# Patient Record
Sex: Female | Born: 1985 | Race: Black or African American | Hispanic: No | Smoking: Former smoker
Health system: Southern US, Community
[De-identification: ages and names within clinical notes are randomized; demographics above are authoritative.]

## PROBLEM LIST (undated history)

## (undated) ENCOUNTER — Inpatient Hospital Stay (HOSPITAL_COMMUNITY): Payer: Medicaid Other

## (undated) DIAGNOSIS — D649 Anemia, unspecified: Secondary | ICD-10-CM

## (undated) DIAGNOSIS — Z789 Other specified health status: Secondary | ICD-10-CM

## (undated) DIAGNOSIS — E669 Obesity, unspecified: Secondary | ICD-10-CM

## (undated) DIAGNOSIS — A749 Chlamydial infection, unspecified: Secondary | ICD-10-CM

## (undated) DIAGNOSIS — D573 Sickle-cell trait: Secondary | ICD-10-CM

## (undated) DIAGNOSIS — K8020 Calculus of gallbladder without cholecystitis without obstruction: Secondary | ICD-10-CM

## (undated) DIAGNOSIS — R87619 Unspecified abnormal cytological findings in specimens from cervix uteri: Secondary | ICD-10-CM

## (undated) DIAGNOSIS — B999 Unspecified infectious disease: Secondary | ICD-10-CM

## (undated) DIAGNOSIS — L309 Dermatitis, unspecified: Secondary | ICD-10-CM

## (undated) DIAGNOSIS — IMO0002 Reserved for concepts with insufficient information to code with codable children: Secondary | ICD-10-CM

---
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
## 2007-04-23 ENCOUNTER — Emergency Department (HOSPITAL_COMMUNITY): Admission: EM | Admit: 2007-04-23 | Discharge: 2007-04-23 | Payer: Self-pay | Admitting: Family Medicine

## 2008-02-16 ENCOUNTER — Inpatient Hospital Stay (HOSPITAL_COMMUNITY): Admission: AD | Admit: 2008-02-16 | Discharge: 2008-02-17 | Payer: Self-pay | Admitting: Obstetrics & Gynecology

## 2008-02-18 ENCOUNTER — Inpatient Hospital Stay (HOSPITAL_COMMUNITY): Admission: AD | Admit: 2008-02-18 | Discharge: 2008-02-18 | Payer: Self-pay | Admitting: Obstetrics & Gynecology

## 2008-02-18 ENCOUNTER — Ambulatory Visit: Payer: Self-pay | Admitting: Family

## 2008-03-06 HISTORY — PX: APPENDECTOMY: SHX54

## 2008-03-31 ENCOUNTER — Inpatient Hospital Stay (HOSPITAL_COMMUNITY): Admission: AD | Admit: 2008-03-31 | Discharge: 2008-04-01 | Payer: Self-pay | Admitting: Obstetrics & Gynecology

## 2008-04-06 ENCOUNTER — Ambulatory Visit (HOSPITAL_COMMUNITY): Admission: RE | Admit: 2008-04-06 | Discharge: 2008-04-06 | Payer: Self-pay | Admitting: Obstetrics & Gynecology

## 2008-08-15 ENCOUNTER — Ambulatory Visit: Payer: Self-pay | Admitting: Advanced Practice Midwife

## 2008-08-15 ENCOUNTER — Inpatient Hospital Stay (HOSPITAL_COMMUNITY): Admission: AD | Admit: 2008-08-15 | Discharge: 2008-08-15 | Payer: Self-pay | Admitting: Family Medicine

## 2008-08-29 ENCOUNTER — Ambulatory Visit: Payer: Self-pay | Admitting: Family

## 2008-08-29 ENCOUNTER — Inpatient Hospital Stay (HOSPITAL_COMMUNITY): Admission: AD | Admit: 2008-08-29 | Discharge: 2008-08-29 | Payer: Self-pay | Admitting: Obstetrics and Gynecology

## 2008-09-02 ENCOUNTER — Ambulatory Visit: Payer: Self-pay | Admitting: Obstetrics & Gynecology

## 2008-09-06 ENCOUNTER — Inpatient Hospital Stay (HOSPITAL_COMMUNITY): Admission: AD | Admit: 2008-09-06 | Discharge: 2008-09-06 | Payer: Self-pay | Admitting: Obstetrics & Gynecology

## 2008-09-07 ENCOUNTER — Inpatient Hospital Stay (HOSPITAL_COMMUNITY): Admission: AD | Admit: 2008-09-07 | Discharge: 2008-09-09 | Payer: Self-pay | Admitting: Obstetrics & Gynecology

## 2008-09-07 ENCOUNTER — Ambulatory Visit: Payer: Self-pay | Admitting: Advanced Practice Midwife

## 2008-11-27 ENCOUNTER — Emergency Department (HOSPITAL_COMMUNITY): Admission: EM | Admit: 2008-11-27 | Discharge: 2008-11-27 | Payer: Self-pay | Admitting: Family Medicine

## 2008-11-28 ENCOUNTER — Inpatient Hospital Stay (HOSPITAL_COMMUNITY): Admission: EM | Admit: 2008-11-28 | Discharge: 2008-11-30 | Payer: Self-pay | Admitting: Emergency Medicine

## 2009-03-06 HISTORY — PX: CHOLECYSTECTOMY: SHX55

## 2009-08-31 ENCOUNTER — Emergency Department (HOSPITAL_COMMUNITY): Admission: EM | Admit: 2009-08-31 | Discharge: 2009-08-31 | Payer: Self-pay | Admitting: Family Medicine

## 2009-11-17 ENCOUNTER — Emergency Department: Payer: Self-pay | Admitting: Emergency Medicine

## 2010-01-22 ENCOUNTER — Emergency Department (HOSPITAL_COMMUNITY): Admission: EM | Admit: 2010-01-22 | Discharge: 2010-01-22 | Payer: Self-pay | Admitting: Family Medicine

## 2010-02-15 ENCOUNTER — Emergency Department: Payer: Self-pay | Admitting: Emergency Medicine

## 2010-02-17 ENCOUNTER — Inpatient Hospital Stay: Payer: Self-pay | Admitting: Surgery

## 2010-03-06 NOTE — L&D Delivery Note (Cosign Needed)
Delivery Note At 6:56 PM a viable female was delivered via Vaginal, Spontaneous Delivery (Presentation: Middle Occiput Posterior).  APGAR: 9 ,9 ; weight 7 lb 8.3 oz (3410 g).   Placenta status: to hospital.  Cord: 3 vessels with the following complications: None.  Cord pH: NA  Anesthesia: Epidural  Episiotomy: NA Lacerations: None Suture Repair: NA Est. Blood Loss (mL): 200 cc  Mom to postpartum.  Baby to nursery-stable.  Birth control: desires BTL, consent complete Feeding: Breast  DE LA CRUZ,Tabias Swayze 02/11/2011, 7:20 PM

## 2010-05-22 LAB — POCT URINALYSIS DIP (DEVICE)
Bilirubin Urine: NEGATIVE
Glucose, UA: NEGATIVE mg/dL
Ketones, ur: NEGATIVE mg/dL
Specific Gravity, Urine: 1.02 (ref 1.005–1.030)
pH: 6 (ref 5.0–8.0)

## 2010-06-10 LAB — CBC
HCT: 29.1 % — ABNORMAL LOW (ref 36.0–46.0)
Hemoglobin: 12 g/dL (ref 12.0–15.0)
MCHC: 33.8 g/dL (ref 30.0–36.0)
MCV: 87.8 fL (ref 78.0–100.0)
Platelets: 185 10*3/uL (ref 150–400)
RBC: 3.3 MIL/uL — ABNORMAL LOW (ref 3.87–5.11)
RBC: 4.05 MIL/uL (ref 3.87–5.11)
WBC: 11.4 10*3/uL — ABNORMAL HIGH (ref 4.0–10.5)
WBC: 6.6 10*3/uL (ref 4.0–10.5)

## 2010-06-10 LAB — URINALYSIS, ROUTINE W REFLEX MICROSCOPIC
Bilirubin Urine: NEGATIVE
Glucose, UA: NEGATIVE mg/dL
Ketones, ur: 15 mg/dL — AB
Protein, ur: 30 mg/dL — AB
Urobilinogen, UA: 1 mg/dL (ref 0.0–1.0)

## 2010-06-10 LAB — CULTURE, BLOOD (ROUTINE X 2)

## 2010-06-10 LAB — URINE MICROSCOPIC-ADD ON

## 2010-06-10 LAB — POCT I-STAT, CHEM 8
BUN: 8 mg/dL (ref 6–23)
Calcium, Ion: 1.09 mmol/L — ABNORMAL LOW (ref 1.12–1.32)
Creatinine, Ser: 0.8 mg/dL (ref 0.4–1.2)
Hemoglobin: 12.2 g/dL (ref 12.0–15.0)
Sodium: 138 mEq/L (ref 135–145)
TCO2: 22 mmol/L (ref 0–100)

## 2010-06-10 LAB — POCT URINALYSIS DIP (DEVICE)
Glucose, UA: NEGATIVE mg/dL
Specific Gravity, Urine: <=1.005 (ref 1.005–1.030)
pH: 5.5 (ref 5.0–8.0)

## 2010-06-10 LAB — URINE CULTURE: Colony Count: 10000

## 2010-06-10 LAB — PREGNANCY, URINE: Preg Test, Ur: NEGATIVE

## 2010-06-10 LAB — DIFFERENTIAL
Basophils Relative: 0 % (ref 0–1)
Eosinophils Absolute: 0 10*3/uL (ref 0.0–0.7)
Lymphs Abs: 1.3 10*3/uL (ref 0.7–4.0)
Monocytes Absolute: 1.1 10*3/uL — ABNORMAL HIGH (ref 0.1–1.0)
Monocytes Relative: 10 % (ref 3–12)

## 2010-06-12 LAB — WET PREP, GENITAL
Clue Cells Wet Prep HPF POC: NONE SEEN
Trich, Wet Prep: NONE SEEN
Yeast Wet Prep HPF POC: NONE SEEN

## 2010-06-12 LAB — GC/CHLAMYDIA PROBE AMP, GENITAL
Chlamydia, DNA Probe: NEGATIVE
GC Probe Amp, Genital: NEGATIVE

## 2010-06-12 LAB — CBC
MCHC: 34.7 g/dL (ref 30.0–36.0)
MCV: 88.3 fL (ref 78.0–100.0)
Platelets: 181 10*3/uL (ref 150–400)
Platelets: 195 10*3/uL (ref 150–400)
RDW: 14.9 % (ref 11.5–15.5)
RDW: 15 % (ref 11.5–15.5)
WBC: 11.4 10*3/uL — ABNORMAL HIGH (ref 4.0–10.5)

## 2010-06-12 LAB — RPR: RPR Ser Ql: NONREACTIVE

## 2010-06-20 LAB — WET PREP, GENITAL
Trich, Wet Prep: NONE SEEN
Yeast Wet Prep HPF POC: NONE SEEN

## 2010-06-20 LAB — URINALYSIS, ROUTINE W REFLEX MICROSCOPIC
Bilirubin Urine: NEGATIVE
Nitrite: NEGATIVE
Specific Gravity, Urine: 1.01 (ref 1.005–1.030)
Urobilinogen, UA: 0.2 mg/dL (ref 0.0–1.0)

## 2010-07-11 LAB — HEPATITIS B SURFACE ANTIGEN: Hepatitis B Surface Ag: NEGATIVE

## 2010-07-13 ENCOUNTER — Ambulatory Visit: Payer: Self-pay | Admitting: Family Medicine

## 2010-08-26 ENCOUNTER — Inpatient Hospital Stay (HOSPITAL_COMMUNITY)
Admission: AD | Admit: 2010-08-26 | Discharge: 2010-08-26 | Disposition: A | Discharge status: Home / Self Care | Payer: Medicaid Other | Source: Ambulatory Visit | Attending: Obstetrics & Gynecology | Admitting: Obstetrics & Gynecology

## 2010-08-26 DIAGNOSIS — O209 Hemorrhage in early pregnancy, unspecified: Secondary | ICD-10-CM | POA: Insufficient documentation

## 2010-08-26 DIAGNOSIS — Z79899 Other long term (current) drug therapy: Secondary | ICD-10-CM | POA: Insufficient documentation

## 2010-08-26 LAB — URINALYSIS, ROUTINE W REFLEX MICROSCOPIC
Bilirubin Urine: NEGATIVE
Glucose, UA: NEGATIVE mg/dL
Hgb urine dipstick: NEGATIVE
Protein, ur: NEGATIVE mg/dL

## 2010-08-26 LAB — DIFFERENTIAL
Basophils Relative: 0 % (ref 0–1)
Eosinophils Absolute: 0 10*3/uL (ref 0.0–0.7)
Lymphs Abs: 2.5 10*3/uL (ref 0.7–4.0)
Neutrophils Relative %: 73 % (ref 43–77)

## 2010-08-26 LAB — CBC
MCV: 84.4 fL (ref 78.0–100.0)
Platelets: 182 10*3/uL (ref 150–400)
RBC: 4.17 MIL/uL (ref 3.87–5.11)
WBC: 11.9 10*3/uL — ABNORMAL HIGH (ref 4.0–10.5)

## 2010-08-26 LAB — WET PREP, GENITAL: Yeast Wet Prep HPF POC: NONE SEEN

## 2010-08-27 LAB — GC/CHLAMYDIA PROBE AMP, GENITAL: GC Probe Amp, Genital: NEGATIVE

## 2010-09-15 ENCOUNTER — Encounter (HOSPITAL_COMMUNITY): Payer: Self-pay | Admitting: *Deleted

## 2010-09-15 ENCOUNTER — Inpatient Hospital Stay (HOSPITAL_COMMUNITY)
Admission: AD | Admit: 2010-09-15 | Discharge: 2010-09-16 | Disposition: A | Discharge status: Home / Self Care | Payer: Medicaid Other | Source: Ambulatory Visit | Attending: Obstetrics & Gynecology | Admitting: Obstetrics & Gynecology

## 2010-09-15 DIAGNOSIS — R350 Frequency of micturition: Secondary | ICD-10-CM | POA: Insufficient documentation

## 2010-09-15 DIAGNOSIS — N760 Acute vaginitis: Secondary | ICD-10-CM | POA: Insufficient documentation

## 2010-09-15 DIAGNOSIS — O0930 Supervision of pregnancy with insufficient antenatal care, unspecified trimester: Secondary | ICD-10-CM | POA: Insufficient documentation

## 2010-09-15 DIAGNOSIS — A499 Bacterial infection, unspecified: Secondary | ICD-10-CM | POA: Insufficient documentation

## 2010-09-15 DIAGNOSIS — R300 Dysuria: Secondary | ICD-10-CM | POA: Insufficient documentation

## 2010-09-15 DIAGNOSIS — O2390 Unspecified genitourinary tract infection in pregnancy, unspecified trimester: Secondary | ICD-10-CM | POA: Insufficient documentation

## 2010-09-15 DIAGNOSIS — B9689 Other specified bacterial agents as the cause of diseases classified elsewhere: Secondary | ICD-10-CM | POA: Insufficient documentation

## 2010-09-15 LAB — URINALYSIS, ROUTINE W REFLEX MICROSCOPIC
Nitrite: NEGATIVE
Protein, ur: NEGATIVE mg/dL
Urobilinogen, UA: 4 mg/dL — ABNORMAL HIGH (ref 0.0–1.0)

## 2010-09-15 LAB — POCT PREGNANCY, URINE: Preg Test, Ur: POSITIVE

## 2010-09-15 NOTE — Progress Notes (Signed)
Pt in c/o burning and tingling with urination.  States she is also having "contractions" periodically throughout the day- states it is not consistent and more pressure than painful.  LMP was 04/22/10.  Has not started prenatal care because states she has not had a ride.  States she has not felt baby move as she should.

## 2010-09-16 LAB — GC/CHLAMYDIA PROBE AMP, GENITAL: Chlamydia, DNA Probe: NEGATIVE

## 2010-09-16 LAB — WET PREP, GENITAL

## 2010-09-16 MED ORDER — METRONIDAZOLE 500 MG PO TABS: 500.0000 mg | ORAL_TABLET | Freq: Two times a day (BID) | ORAL | Status: AC

## 2010-09-16 NOTE — ED Provider Notes (Addendum)
History   Chief Complaint:  Urinary Frequency and Abdominal Pain   Bianca Cantrell is  25 y.o. [id number].  Patient's last menstrual period was 04/22/2010..   She presents complaining of dysuria and increased urinary frequency that started today. She denies any n/v, any fevers/chills, or back pain. She denies hematuria, discharge or vaginal bleeding.  She also complains of bilateral round ligament pain.  She has not had any prenatal care.  OB History    Grav Para Term Preterm Abortions TAB SAB Ect Mult Living   3 2 2  0 0 0 0 0 0 2       PMHx: chlamydia, gallstones, pyelo, UTI  Past Surgical History  Procedure Date  . Cholecystectomy 2011  . Appendectomy 2010    No family history on file.  History  Substance Use Topics  . Smoking status: Never Smoker   . Smokeless tobacco: Never Used  . Alcohol Use: No    Allergies:  Allergies  Allergen Reactions  . Other Hives    Mayonnaise   . Sulfa Antibiotics Hives    Prescriptions prior to admission  Medication Sig Dispense Refill  . acetaminophen (TYLENOL) 325 MG tablet Take 650 mg by mouth daily as needed. pain       . prenatal vitamin w/FE, FA (PRENATAL 1 + 1) 27-1 MG TABS Take 1 tablet by mouth daily.          Review of Systems - Negative except per HPI  Physical Exam   Blood pressure 131/72, pulse 100, temperature 98.2 F (36.8 C), temperature source Oral, resp. rate 20, height 4\' 10"  (1.473 m), weight 212 lb (96.163 kg), last menstrual period 04/22/2010.  General: General appearance - alert, well appearing, and in no distress Chest - clear to auscultation, no wheezes, rales or rhonchi, symmetric air entry Heart - normal rate, regular rhythm, normal S1, S2, no murmurs, rubs, clicks or gallops Abdomen - soft, nontender, nondistended, gravid, fundus at 1cm above umbilicus.  Extremities - peripheral pulses normal, no pedal edema, no clubbing or cyanosis Focused Gynecological Exam: normal external genitalia, vulva,  vagina, cervix, gravid uterus.  SVE: closed, long.  Labs: Recent Results (from the past 24 hour(s))  URINALYSIS, ROUTINE W REFLEX MICROSCOPIC   Collection Time   09/15/10 11:10 PM      Component Value Range   Color, Urine YELLOW  YELLOW    Appearance CLEAR  CLEAR    Specific Gravity, Urine 1.020  1.005 - 1.030    pH 6.0  5.0 - 8.0    Glucose, UA NEGATIVE  NEGATIVE (mg/dL)   Hgb urine dipstick TRACE (*) NEGATIVE    Bilirubin Urine NEGATIVE  NEGATIVE    Ketones NEGATIVE  NEGATIVE (mg/dL)   Protein NEGATIVE  NEGATIVE (mg/dL)   Urobilinogen, UA 4.0 (*) 0.0 - 1.0 (mg/dL)   Nitrite NEGATIVE  NEGATIVE    Leukocytes, UA SMALL (*) NEGATIVE   URINE MICROSCOPIC-ADD ON   Collection Time   09/15/10 11:10 PM      Component Value Range   Squamous Epithelial / LPF RARE  RARE    WBC, UA 11-20  <3 (WBC/hpf)   RBC / HPF 3-6  <3 (RBC/hpf)   Bacteria, UA FEW (*) RARE   POCT PREGNANCY, URINE   Collection Time   09/15/10 11:14 PM      Component Value Range   Preg Test, Ur POSITIVE     Wet prep: few clue cells, few WBC    Assessment: 25 yo [id number]  at 20'6wga who presented with dysuria and increased urinary frequency. UTI was ruled out.   Plan: 1. Bacterial Vaginosis: treat with flagyl 500mg  bid for one week 2. Absence of prenatal care: pt was advised to seek prenatal care. Was given information about health department and other possible places. Pt advised to continue with prenatal vitamins.  3. Pt told that she would be called if GC/Chl results were positive. Number where pt can be reached: [phone] Samanyu Tinnell 09/16/2010, 1:11 AM

## 2010-09-17 ENCOUNTER — Inpatient Hospital Stay (HOSPITAL_COMMUNITY)
Admission: AD | Admit: 2010-09-17 | Discharge: 2010-09-17 | Disposition: A | Discharge status: Home / Self Care | Payer: Medicaid Other | Source: Ambulatory Visit | Attending: Family Medicine | Admitting: Family Medicine

## 2010-09-17 ENCOUNTER — Encounter (HOSPITAL_COMMUNITY): Payer: Self-pay | Admitting: *Deleted

## 2010-09-17 DIAGNOSIS — R300 Dysuria: Secondary | ICD-10-CM

## 2010-09-17 DIAGNOSIS — N390 Urinary tract infection, site not specified: Secondary | ICD-10-CM

## 2010-09-17 DIAGNOSIS — N949 Unspecified condition associated with female genital organs and menstrual cycle: Secondary | ICD-10-CM

## 2010-09-17 DIAGNOSIS — N938 Other specified abnormal uterine and vaginal bleeding: Secondary | ICD-10-CM | POA: Insufficient documentation

## 2010-09-17 LAB — URINALYSIS, ROUTINE W REFLEX MICROSCOPIC
Specific Gravity, Urine: 1.025 (ref 1.005–1.030)
Urobilinogen, UA: >8 mg/dL — ABNORMAL HIGH (ref 0.0–1.0)
pH: 6.5 (ref 5.0–8.0)

## 2010-09-17 LAB — URINE MICROSCOPIC-ADD ON

## 2010-09-17 MED ORDER — NITROFURANTOIN MONOHYD MACRO 100 MG PO CAPS: 100.0000 mg | ORAL_CAPSULE | Freq: Two times a day (BID) | ORAL | Status: AC

## 2010-09-17 NOTE — Progress Notes (Signed)
Was given Flagyl two days ago for BV, today having vaginal bleeding in the toilet, feels like having contractions

## 2010-09-17 NOTE — ED Provider Notes (Addendum)
History     Chief Complaint  Patient presents with  . Dysuria  . Vaginal Bleeding   HPI  OB History    Grav Para Term Preterm Abortions TAB SAB Ect Mult Living   3 2 2  0 0 0 0 0 0 2      History reviewed. No pertinent past medical history.  Past Surgical History  Procedure Date  . Cholecystectomy 2011  . Appendectomy 2010    History reviewed. No pertinent family history.  History  Substance Use Topics  . Smoking status: Never Smoker   . Smokeless tobacco: Never Used  . Alcohol Use: No    Allergies:  Allergies  Allergen Reactions  . Other Hives    Mayonnaise   . Sulfa Antibiotics Hives    Prescriptions prior to admission  Medication Sig Dispense Refill  . acetaminophen (TYLENOL) 325 MG tablet Take 650 mg by mouth daily as needed. pain       . metroNIDAZOLE (FLAGYL) 500 MG tablet Take 1 tablet (500 mg total) by mouth 2 (two) times daily.  14 tablet  0  . prenatal vitamin w/FE, FA (PRENATAL 1 + 1) 27-1 MG TABS Take 1 tablet by mouth daily.          ROS Physical Exam   Blood pressure 105/53, pulse 99, temperature 98 F (36.7 C), temperature source Oral, resp. rate 20, height 4\' 10"  (1.473 m), weight 106.867 kg (235 lb 9.6 oz), last menstrual period 04/22/2010.  Physical Exam  MAU Course  Procedures  MDM Sterile spec exam done. No evidence of any vag bleeding. gc and chla obtained.

## 2010-09-19 LAB — GC/CHLAMYDIA PROBE AMP, GENITAL
Chlamydia, DNA Probe: NEGATIVE
GC Probe Amp, Genital: NEGATIVE

## 2010-09-19 LAB — URINE CULTURE

## 2010-09-21 NOTE — ED Provider Notes (Signed)
Urine culture reviewed.  No infection identified.  No action needed at this time.

## 2010-11-08 ENCOUNTER — Inpatient Hospital Stay (HOSPITAL_COMMUNITY)
Admission: AD | Admit: 2010-11-08 | Discharge: 2010-11-09 | Disposition: A | Discharge status: Home / Self Care | Payer: Medicaid Other | Source: Ambulatory Visit | Attending: Obstetrics & Gynecology | Admitting: Obstetrics & Gynecology

## 2010-11-08 DIAGNOSIS — Y93E5 Activity, floor mopping and cleaning: Secondary | ICD-10-CM | POA: Insufficient documentation

## 2010-11-08 DIAGNOSIS — Y92009 Unspecified place in unspecified non-institutional (private) residence as the place of occurrence of the external cause: Secondary | ICD-10-CM | POA: Insufficient documentation

## 2010-11-08 DIAGNOSIS — Z331 Pregnant state, incidental: Secondary | ICD-10-CM

## 2010-11-08 DIAGNOSIS — Y998 Other external cause status: Secondary | ICD-10-CM | POA: Insufficient documentation

## 2010-11-08 DIAGNOSIS — M549 Dorsalgia, unspecified: Secondary | ICD-10-CM | POA: Insufficient documentation

## 2010-11-08 DIAGNOSIS — R109 Unspecified abdominal pain: Secondary | ICD-10-CM | POA: Insufficient documentation

## 2010-11-08 DIAGNOSIS — W010XXA Fall on same level from slipping, tripping and stumbling without subsequent striking against object, initial encounter: Secondary | ICD-10-CM | POA: Insufficient documentation

## 2010-11-08 NOTE — Progress Notes (Signed)
PT SAYS SHE FELL  AT 930PM-  SHE WAS WASH ING DISHES-   WATER  ON FLOOR. DID NOT HIT ABD-  FEELS PAIN IN BACK  AND BELOW UMBILICUS.

## 2010-11-09 MED ORDER — ACETAMINOPHEN 500 MG PO TABS
1000.0000 mg | ORAL_TABLET | Freq: Four times a day (QID) | ORAL | Status: DC | PRN
  Administered 2010-11-09: 1000 mg via ORAL
  Filled 2010-11-09: qty 2

## 2010-11-09 NOTE — ED Provider Notes (Signed)
Pt seen and examined by me. She appeared to be sleeping/snoring when I entered room. No bruising or erythema on left side.  FHR- reassuring for GA with accels and no decels; no ctx per toco or palp  cx- closed and long  Will d/c home with comfort tips (heating pad on low to side, tub, Tylenol) F/U at next sched appt or prn.

## 2010-11-09 NOTE — ED Provider Notes (Signed)
History      HPI: Bianca Cantrell is a [id number] at [redacted]w[redacted]d who presents after a fall at approximately 10:00pm this evening.  States the fall occurred while she was cleaning her kitchen and she slipped on a wet area of the floor.  Fell on her left side and was able to partially break the fall with her arm.  States pain was greatest immediately after the fall and has improved since then, currently rates the pain 3/10.  Pain is greatest on left side of abdomen but does note some lower pain pain. Denies any vaginal bleeding or fluid leakage since the fall. Denies contractions since the incident.  Denies loss of consciousness after the fall.  Denies sensation of dizziness or syncope prior to the fall.  Denies hitting head at all during fall.   Reports a pressure sensation in pelvis that was present prior to her fall and has not worsened since the fall.  Fetal movements present.        No past medical history on file.  Past Surgical History  Procedure Date   Cholecystectomy 2011   Appendectomy 2010    No family history on file.  History  Substance Use Topics   Smoking status: Never Smoker    Smokeless tobacco: Never Used   Alcohol Use: No    Allergies:  Allergies  Allergen Reactions   Other Hives    Mayonnaise    Sulfa Antibiotics Hives    Prescriptions prior to admission  Medication Sig Dispense Refill   acetaminophen (TYLENOL) 325 MG tablet Take 650 mg by mouth daily as needed. pain        prenatal vitamin w/FE, FA (PRENATAL 1 + 1) 27-1 MG TABS Take 1 tablet by mouth daily.          Review of Systems  Constitutional: Negative for fever, chills, weight loss, malaise/fatigue and diaphoresis.  HENT: Negative.   Eyes: Negative.   Respiratory: Negative for cough, sputum production, shortness of breath and wheezing.   Cardiovascular: Negative for chest pain, palpitations and orthopnea.  Gastrointestinal: Positive for abdominal pain (Abdominal pain since fall today) and  constipation (Reports sensation of difficult bowel movements. Stool is not hard but patients still finds the BMs difficult). Negative for nausea, vomiting and diarrhea.  Genitourinary: Negative for dysuria, urgency, frequency, hematuria and flank pain.  Musculoskeletal: Positive for back pain (Pain localized to lower back, does not radiate) and falls (Fall this evening on wet floor while cleaning kitchen).  Skin: Negative.   Neurological: Negative.  Negative for dizziness, tingling, tremors, sensory change, loss of consciousness and weakness.  Endo/Heme/Allergies: Negative.   Psychiatric/Behavioral: Negative.  Negative for substance abuse.   Physical Exam   Blood pressure 104/64, pulse 101, temperature 98.7 F (37.1 C), temperature source Oral, resp. rate 18, height 4\' 9"  (1.448 m), weight 237 lb 4 oz (107.616 kg), last menstrual period 04/22/2010.  Physical Exam  Vitals reviewed. Constitutional: She is oriented to person, place, and time. She appears well-developed and well-nourished.  HENT:  Head: Normocephalic and atraumatic.  Neck: Normal range of motion. Neck supple.  Cardiovascular: Normal rate, regular rhythm, normal heart sounds and intact distal pulses.  Exam reveals no gallop and no friction rub.   No murmur heard. Respiratory: Effort normal and breath sounds normal. No respiratory distress. She has no wheezes. She has no rales.  GI: Soft. Bowel sounds are normal. She exhibits no mass. There is tenderness (Tender over left abdomen). There is guarding (Voluntary guarding over  left abdomen). There is no rebound.  Musculoskeletal: Normal range of motion.  Neurological: She is alert and oriented to person, place, and time. She has normal reflexes.  Skin: Skin is warm and dry.  Psychiatric: She has a normal mood and affect. Her behavior is normal.    MAU Course  Procedures - None   Assessment and Plan  Bianca Cantrell is a 25 yo [id number] at [facility] [redacted]w[redacted]d presenting with abdominal and  back pain s/p fall this evening at 10:00.  1) IUP at GA [redacted]w[redacted]d 2) Fall associated pain- D/C home with comfort tips of using warm heating pads on painful areas and tylonol as needed.  Adam Chenevert PA-S 11/09/2010, 12:41 AM

## 2010-11-19 NOTE — ED Provider Notes (Signed)
Pt seen and rev'd. FHR 150s, reactive and reassuring without decels x 1hr and rare ctx per toco. Cx closed and long. Mild discomfort mostly on her left side. D/C home with comfort tips given per PA student, Esaw Dace.

## 2010-12-09 LAB — URINE CULTURE: Colony Count: >=100000

## 2010-12-09 LAB — GC/CHLAMYDIA PROBE AMP, GENITAL: GC Probe Amp, Genital: NEGATIVE

## 2010-12-09 LAB — URINALYSIS, ROUTINE W REFLEX MICROSCOPIC
Nitrite: POSITIVE — AB
Specific Gravity, Urine: 1.01 (ref 1.005–1.030)
pH: 5.5 (ref 5.0–8.0)

## 2010-12-09 LAB — WET PREP, GENITAL: Clue Cells Wet Prep HPF POC: NONE SEEN

## 2010-12-09 LAB — CBC
MCHC: 34.2 g/dL (ref 30.0–36.0)
MCV: 89.2 fL (ref 78.0–100.0)
Platelets: 222 10*3/uL (ref 150–400)

## 2010-12-09 LAB — URINE MICROSCOPIC-ADD ON

## 2010-12-09 LAB — POCT PREGNANCY, URINE: Preg Test, Ur: POSITIVE

## 2011-02-11 ENCOUNTER — Encounter (HOSPITAL_COMMUNITY): Payer: Self-pay | Admitting: Anesthesiology

## 2011-02-11 ENCOUNTER — Inpatient Hospital Stay (HOSPITAL_COMMUNITY): Payer: Medicaid Other | Admitting: Anesthesiology

## 2011-02-11 ENCOUNTER — Inpatient Hospital Stay (HOSPITAL_COMMUNITY)
Admission: AD | Admit: 2011-02-11 | Discharge: 2011-02-13 | DRG: 775 | Disposition: A | Discharge status: Home / Self Care | Payer: Medicaid Other | Source: Ambulatory Visit | Attending: Obstetrics and Gynecology | Admitting: Obstetrics and Gynecology

## 2011-02-11 ENCOUNTER — Encounter (HOSPITAL_COMMUNITY): Payer: Self-pay | Admitting: *Deleted

## 2011-02-11 DIAGNOSIS — O9989 Other specified diseases and conditions complicating pregnancy, childbirth and the puerperium: Secondary | ICD-10-CM

## 2011-02-11 DIAGNOSIS — Z22330 Carrier of Group B streptococcus: Secondary | ICD-10-CM

## 2011-02-11 DIAGNOSIS — O99892 Other specified diseases and conditions complicating childbirth: Principal | ICD-10-CM | POA: Diagnosis present

## 2011-02-11 HISTORY — DX: Other specified health status: Z78.9

## 2011-02-11 LAB — CBC
HCT: 35.8 % — ABNORMAL LOW (ref 36.0–46.0)
Hemoglobin: 12.2 g/dL (ref 12.0–15.0)
MCV: 83.6 fL (ref 78.0–100.0)
RDW: 15.6 % — ABNORMAL HIGH (ref 11.5–15.5)
WBC: 9.3 10*3/uL (ref 4.0–10.5)

## 2011-02-11 LAB — ABO/RH: ABO/RH(D): O POS

## 2011-02-11 MED ORDER — FAMOTIDINE 20 MG PO TABS: 40.0000 mg | ORAL_TABLET | Freq: Once | ORAL | Status: DC

## 2011-02-11 MED ORDER — OXYCODONE-ACETAMINOPHEN 5-325 MG PO TABS: 2.0000 | ORAL_TABLET | ORAL | Status: DC | PRN

## 2011-02-11 MED ORDER — EPHEDRINE 5 MG/ML INJ: 10.0000 mg | INTRAVENOUS | Status: DC | PRN

## 2011-02-11 MED ORDER — METOCLOPRAMIDE HCL 10 MG PO TABS: 10.0000 mg | ORAL_TABLET | Freq: Once | ORAL | Status: DC

## 2011-02-11 MED ORDER — FENTANYL 2.5 MCG/ML BUPIVACAINE 1/10 % EPIDURAL INFUSION (WH - ANES)
14.0000 mL/h | INTRAMUSCULAR | Status: DC
  Administered 2011-02-11: 14 mL/h via EPIDURAL
  Filled 2011-02-11: qty 60

## 2011-02-11 MED ORDER — OXYTOCIN BOLUS FROM INFUSION
500.0000 mL | Freq: Once | INTRAVENOUS | Status: DC
  Filled 2011-02-11: qty 500
  Filled 2011-02-11: qty 1000

## 2011-02-11 MED ORDER — ACETAMINOPHEN 325 MG PO TABS: 650.0000 mg | ORAL_TABLET | ORAL | Status: DC | PRN

## 2011-02-11 MED ORDER — DIPHENHYDRAMINE HCL 25 MG PO CAPS: 25.0000 mg | ORAL_CAPSULE | Freq: Four times a day (QID) | ORAL | Status: DC | PRN

## 2011-02-11 MED ORDER — SENNOSIDES-DOCUSATE SODIUM 8.6-50 MG PO TABS
2.0000 | ORAL_TABLET | Freq: Every day | ORAL | Status: DC
  Administered 2011-02-11 – 2011-02-12 (×2): 2 via ORAL

## 2011-02-11 MED ORDER — SIMETHICONE 80 MG PO CHEW: 80.0000 mg | CHEWABLE_TABLET | ORAL | Status: DC | PRN

## 2011-02-11 MED ORDER — LANOLIN HYDROUS EX OINT: TOPICAL_OINTMENT | CUTANEOUS | Status: DC | PRN

## 2011-02-11 MED ORDER — CITRIC ACID-SODIUM CITRATE 334-500 MG/5ML PO SOLN: 30.0000 mL | ORAL | Status: DC | PRN

## 2011-02-11 MED ORDER — PHENYLEPHRINE 40 MCG/ML (10ML) SYRINGE FOR IV PUSH (FOR BLOOD PRESSURE SUPPORT): 80.0000 ug | PREFILLED_SYRINGE | INTRAVENOUS | Status: DC | PRN

## 2011-02-11 MED ORDER — PENICILLIN G POTASSIUM 5000000 UNITS IJ SOLR
2.5000 10*6.[IU] | INTRAVENOUS | Status: DC
  Administered 2011-02-11 (×2): 2.5 10*6.[IU] via INTRAVENOUS
  Filled 2011-02-11 (×5): qty 2.5

## 2011-02-11 MED ORDER — PENICILLIN G POTASSIUM 5000000 UNITS IJ SOLR
5.0000 10*6.[IU] | Freq: Once | INTRAMUSCULAR | Status: AC
  Administered 2011-02-11: 5 10*6.[IU] via INTRAVENOUS
  Filled 2011-02-11: qty 5

## 2011-02-11 MED ORDER — LACTATED RINGERS IV SOLN
INTRAVENOUS | Status: DC
  Administered 2011-02-11 (×2): via INTRAVENOUS

## 2011-02-11 MED ORDER — TETANUS-DIPHTH-ACELL PERTUSSIS 5-2.5-18.5 LF-MCG/0.5 IM SUSP: 0.5000 mL | Freq: Once | INTRAMUSCULAR | Status: DC

## 2011-02-11 MED ORDER — BENZOCAINE-MENTHOL 20-0.5 % EX AERO: 1.0000 "application " | INHALATION_SPRAY | CUTANEOUS | Status: DC | PRN

## 2011-02-11 MED ORDER — FENTANYL 2.5 MCG/ML BUPIVACAINE 1/10 % EPIDURAL INFUSION (WH - ANES)
INTRAMUSCULAR | Status: AC
  Administered 2011-02-11: 14 mL/h via EPIDURAL
  Filled 2011-02-11: qty 60

## 2011-02-11 MED ORDER — LACTATED RINGERS IV SOLN: 500.0000 mL | Freq: Once | INTRAVENOUS | Status: DC

## 2011-02-11 MED ORDER — NALBUPHINE SYRINGE 5 MG/0.5 ML
10.0000 mg | INJECTION | INTRAMUSCULAR | Status: DC | PRN
  Administered 2011-02-11: 10 mg via INTRAVENOUS
  Filled 2011-02-11 (×2): qty 1

## 2011-02-11 MED ORDER — PRENATAL PLUS 27-1 MG PO TABS
1.0000 | ORAL_TABLET | Freq: Every day | ORAL | Status: DC
  Administered 2011-02-11 – 2011-02-13 (×3): 1 via ORAL
  Filled 2011-02-11 (×3): qty 1

## 2011-02-11 MED ORDER — OXYTOCIN 20 UNITS IN LACTATED RINGERS INFUSION - SIMPLE
1.0000 m[IU]/min | INTRAVENOUS | Status: DC
  Administered 2011-02-11: 1 m[IU]/min via INTRAVENOUS

## 2011-02-11 MED ORDER — DIBUCAINE 1 % RE OINT: 1.0000 "application " | TOPICAL_OINTMENT | RECTAL | Status: DC | PRN

## 2011-02-11 MED ORDER — NALBUPHINE SYRINGE 5 MG/0.5 ML
INJECTION | INTRAMUSCULAR | Status: AC
  Filled 2011-02-11: qty 1

## 2011-02-11 MED ORDER — TERBUTALINE SULFATE 1 MG/ML IJ SOLN: 0.2500 mg | Freq: Once | INTRAMUSCULAR | Status: DC | PRN

## 2011-02-11 MED ORDER — BENZOCAINE-MENTHOL 20-0.5 % EX AERO
INHALATION_SPRAY | CUTANEOUS | Status: AC
  Administered 2011-02-11: 23:00:00
  Filled 2011-02-11: qty 56

## 2011-02-11 MED ORDER — ONDANSETRON HCL 4 MG PO TABS: 4.0000 mg | ORAL_TABLET | ORAL | Status: DC | PRN

## 2011-02-11 MED ORDER — ONDANSETRON HCL 4 MG/2ML IJ SOLN
4.0000 mg | Freq: Four times a day (QID) | INTRAMUSCULAR | Status: DC | PRN
  Administered 2011-02-11: 4 mg via INTRAVENOUS
  Filled 2011-02-11: qty 2

## 2011-02-11 MED ORDER — NALBUPHINE HCL 10 MG/ML IJ SOLN
10.0000 mg | INTRAMUSCULAR | Status: DC | PRN
  Administered 2011-02-11: 10 mg via INTRAVENOUS

## 2011-02-11 MED ORDER — DIPHENHYDRAMINE HCL 50 MG/ML IJ SOLN: 12.5000 mg | INTRAMUSCULAR | Status: DC | PRN

## 2011-02-11 MED ORDER — LACTATED RINGERS IV SOLN
500.0000 mL | INTRAVENOUS | Status: DC | PRN
  Administered 2011-02-11: 1000 mL via INTRAVENOUS

## 2011-02-11 MED ORDER — OXYTOCIN 20 UNITS IN LACTATED RINGERS INFUSION - SIMPLE
125.0000 mL/h | Freq: Once | INTRAVENOUS | Status: AC
  Administered 2011-02-11: 999 mL/h via INTRAVENOUS

## 2011-02-11 MED ORDER — LACTATED RINGERS IV SOLN: INTRAVENOUS | Status: DC

## 2011-02-11 MED ORDER — LIDOCAINE HCL (PF) 1 % IJ SOLN
30.0000 mL | INTRAMUSCULAR | Status: DC | PRN
  Filled 2011-02-11: qty 30

## 2011-02-11 MED ORDER — IBUPROFEN 600 MG PO TABS
600.0000 mg | ORAL_TABLET | Freq: Four times a day (QID) | ORAL | Status: DC
  Administered 2011-02-12 – 2011-02-13 (×7): 600 mg via ORAL
  Filled 2011-02-11 (×6): qty 1

## 2011-02-11 MED ORDER — WITCH HAZEL-GLYCERIN EX PADS: 1.0000 "application " | MEDICATED_PAD | CUTANEOUS | Status: DC | PRN

## 2011-02-11 MED ORDER — LIDOCAINE HCL 1.5 % IJ SOLN
INTRAMUSCULAR | Status: DC | PRN
  Administered 2011-02-11 (×2): 5 mL via EPIDURAL

## 2011-02-11 MED ORDER — IBUPROFEN 600 MG PO TABS: 600.0000 mg | ORAL_TABLET | Freq: Four times a day (QID) | ORAL | Status: DC | PRN

## 2011-02-11 MED ORDER — OXYCODONE-ACETAMINOPHEN 5-325 MG PO TABS
1.0000 | ORAL_TABLET | ORAL | Status: DC | PRN
  Administered 2011-02-12: 2 via ORAL
  Administered 2011-02-12 (×2): 1 via ORAL
  Filled 2011-02-11 (×2): qty 1
  Filled 2011-02-11: qty 2

## 2011-02-11 MED ORDER — ZOLPIDEM TARTRATE 5 MG PO TABS: 5.0000 mg | ORAL_TABLET | Freq: Every evening | ORAL | Status: DC | PRN

## 2011-02-11 MED ORDER — ONDANSETRON HCL 4 MG/2ML IJ SOLN: 4.0000 mg | INTRAMUSCULAR | Status: DC | PRN

## 2011-02-11 MED ORDER — FLEET ENEMA 7-19 GM/118ML RE ENEM: 1.0000 | ENEMA | RECTAL | Status: DC | PRN

## 2011-02-11 NOTE — Anesthesia Procedure Notes (Addendum)
Epidural Patient location during procedure: OB Start time: 02/11/2011 2:35 PM  Staffing Anesthesiologist: Brayton Caves R Performed by: anesthesiologist   Preanesthetic Checklist Completed: patient identified, site marked, surgical consent, pre-op evaluation, timeout performed, IV checked, risks and benefits discussed and monitors and equipment checked  Epidural Patient position: sitting Prep: site prepped and draped and DuraPrep Patient monitoring: continuous pulse ox and blood pressure Approach: midline Injection technique: LOR air and LOR saline  Needle:  Needle type: Tuohy  Needle gauge: 17 G Needle length: 9 cm Needle insertion depth: 9 cm Catheter type: closed end flexible Catheter size: 19 Gauge Catheter at skin depth: 14 cm Test dose: negative  Assessment Events: blood not aspirated, injection not painful, no injection resistance, negative IV test and no paresthesia  Additional Notes Patient identified.  Risk benefits discussed including failed block, incomplete pain control, headache, nerve damage, paralysis, blood pressure changes, nausea, vomiting, reactions to medication both toxic or allergic, and postpartum back pain.  Patient expressed understanding and wished to proceed.  All questions were answered.  Sterile technique used throughout procedure and epidural site dressed with sterile barrier dressing. No paresthesia or other complications noted.The patient did not experience any signs of intravascular injection such as tinnitus or metallic taste in mouth nor signs of intrathecal spread such as rapid motor block. Please see nursing notes for vital signs.   Epidural

## 2011-02-11 NOTE — Progress Notes (Signed)
Bianca Cantrell is a 25 y.o. [id number] at [redacted]w[redacted]d by ultrasound admitted for rupture of membranes.  Subjective: Patient says contractions are getting stronger.  Still does not want epidural.  Objective: BP 137/89  Pulse 93  Temp(Src) 98.6 F (37 C) (Oral)  Resp 28  Ht 4' 10.5" (1.486 m)  Wt 107.14 kg (236 lb 3.2 oz)  BMI 48.53 kg/m2  SpO2 98%  LMP 04/22/2010      FHT:  FHR: 150 bpm, variability: moderate,  accelerations:  Present,  decelerations:  Absent UC:   regular, every 3-4 minutes SVE:   Dilation: 3 Effacement (%): 70 Station: 0 Exam by:: Ferne Coe RN  Labs: Lab Results  Component Value Date   WBC 9.3 02/11/2011   HGB 12.2 02/11/2011   HCT 35.8* 02/11/2011   MCV 83.6 02/11/2011   PLT 191 02/11/2011    Assessment / Plan: Spontaneous labor, progressing normally  Labor: Progressing on Pitocin, will continue to increase Preeclampsia:  n/a Fetal Wellbeing:  Category I Pain Control:  Labor support without medications I/D:  n/a Anticipated MOD:  NSVD  DE LA CRUZ,Gery Sabedra 02/11/2011, 12:14 PM

## 2011-02-11 NOTE — Progress Notes (Signed)
Pt in for srom at 0615.  States fluid was clear but had a green streak.  Denies any bleeding.  Reports ucs q10 minutes.  + FM.  No PNC in [place], receives care at [facility].  + GBS.

## 2011-02-11 NOTE — Progress Notes (Signed)
Dr. Domenick Bookbinder at bedside to rouns on patient

## 2011-02-11 NOTE — Anesthesia Preprocedure Evaluation (Signed)
Anesthesia Evaluation  Patient identified by MRN, date of birth, ID band Patient awake    Reviewed: Allergy & Precautions, H&P , Patient's Chart, lab work & pertinent test results  Airway Mallampati: IV TM Distance: >3 FB Neck ROM: full    Dental No notable dental hx.    Pulmonary neg pulmonary ROS,  clear to auscultation  Pulmonary exam normal       Cardiovascular neg cardio ROS regular Normal    Neuro/Psych Negative Neurological ROS  Negative Psych ROS   GI/Hepatic negative GI ROS, Neg liver ROS,   Endo/Other  Negative Endocrine ROSMorbid obesity  Renal/GU negative Renal ROS     Musculoskeletal   Abdominal   Peds  Hematology negative hematology ROS (+)   Anesthesia Other Findings   Reproductive/Obstetrics (+) Pregnancy                           Anesthesia Physical Anesthesia Plan  ASA: III  Anesthesia Plan: Epidural   Post-op Pain Management:    Induction:   Airway Management Planned:   Additional Equipment:   Intra-op Plan:   Post-operative Plan:   Informed Consent: I have reviewed the patients History and Physical, chart, labs and discussed the procedure including the risks, benefits and alternatives for the proposed anesthesia with the patient or authorized representative who has indicated his/her understanding and acceptance.     Plan Discussed with:   Anesthesia Plan Comments:         Anesthesia Quick Evaluation

## 2011-02-11 NOTE — H&P (Cosign Needed)
Please see MAU note for complete H&P.  Dr. Sherron Flemings Sondra Come

## 2011-02-11 NOTE — ED Provider Notes (Signed)
Bianca Cantrell is a 25 y.o. female presenting for SROM @ 0615 AM.  History OB History    Grav Para Term Preterm Abortions TAB SAB Ect Mult Living   4 2 2  0 1 0 1 0 0 2     History reviewed. No pertinent past medical history. Past Surgical History  Procedure Date  . Cholecystectomy 2011  . Appendectomy 2010   Family History: family history includes Heart disease in her maternal grandmother and mother and Hypertension in her maternal grandmother and mother. Social History:  reports that she has never smoked. She has never used smokeless tobacco. She reports that she does not drink alcohol or use illicit drugs.  ROS  Denies any fever, chills, NS, nausea/vomiting.  Endorses painful ctx and good fetal movement.   Blood pressure 133/79, pulse 90, temperature 98.4 F (36.9 C), temperature source Oral, resp. rate 20, height 4' 10.5" (1.486 m), weight 107.14 kg (236 lb 3.2 oz), last menstrual period 04/22/2010, SpO2 98.00%.   Exam Physical Exam  Constitutional: She is oriented to person, place, and time. No distress.  HENT:  Head: Normocephalic and atraumatic.  Mouth/Throat: Oropharynx is clear and moist.  Neck: Normal range of motion. Neck supple.  Cardiovascular: Normal rate, regular rhythm, normal heart sounds and intact distal pulses.  Exam reveals no gallop and no friction rub.   No murmur heard. Respiratory: Effort normal and breath sounds normal. She has no wheezes. She has no rales.  GI: Bowel sounds are normal. She exhibits no distension. There is no tenderness. There is no rebound and no guarding.       Gravid  Musculoskeletal: She exhibits no edema and no tenderness.  Neurological: She is alert and oriented to person, place, and time.  Skin: Skin is dry.     Prenatal labs: ABO, Rh:  pending Antibody:   Rubella:  pending RPR:   pending HBsAg:    HIV:   pending GBS:   POSITIVE, no PCN allergy 1 hr GTT: passed  Assessment/Plan: 1) SROM: Fern positive, will admit  to BS  2) GBS Positive: will treat with PCN 3) FHT reassuring 4) Disposition: admit to inpatient    DE LA CRUZ,IVY 02/11/2011, 7:55 AM

## 2011-02-11 NOTE — ED Notes (Signed)
Report called to [facility] in [place]. Unable to take pt at this time. Will bring back to RM # 4

## 2011-02-11 NOTE — Progress Notes (Signed)
Pt transferred to room 119 via wheelchair. Family walking with pt. Pt tolerated well. No signs of distress noted.

## 2011-02-11 NOTE — Progress Notes (Signed)
SVD of viable female per Dr Tye Savoy.  Wynelle Bourgeois CNM at bedside.

## 2011-02-11 NOTE — Progress Notes (Signed)
G3P2 at 39.4wks. Awoke at 0600 leaking a lot of clear fld which continues. ctxs since Friday but stronger since water broke. PNC in [place]. Pt knows she is GBS positive

## 2011-02-11 NOTE — Progress Notes (Signed)
Bianca Cantrell is a 25 y.o. [id number] at [redacted]w[redacted]d by ultrasound admitted for rupture of membranes  Subjective: Epidural placed.  No complaints at this time.  Objective: BP 113/72  Pulse 104  Temp(Src) 98.8 F (37.1 C) (Oral)  Resp 16  Ht 4' 10.5" (1.486 m)  Wt 107.14 kg (236 lb 3.2 oz)  BMI 48.53 kg/m2  SpO2 97%  LMP 04/22/2010      FHT:  FHR: 140s bpm, variability: moderate,  accelerations:  Present,  decelerations:  Absent UC:   regular, every 2-3 minutes SVE:   Dilation: 8 Effacement (%):  (cervical swelling noted on exam) Station: 0 Exam by:: Ferne Coe RN  Labs: Lab Results  Component Value Date   WBC 9.3 02/11/2011   HGB 12.2 02/11/2011   HCT 35.8* 02/11/2011   MCV 83.6 02/11/2011   PLT 191 02/11/2011    Assessment / Plan: Spontaneous labor, progressing normally, Pitocin turned off.  Labor: Progressing normally Preeclampsia:  NA Fetal Wellbeing:  Category I Pain Control:  Epidural I/D:  n/a Anticipated MOD:  NSVD  DE LA CRUZ,Doree Kuehne 02/11/2011, 3:20 PM

## 2011-02-12 ENCOUNTER — Encounter (HOSPITAL_COMMUNITY)
Admission: AD | Disposition: A | Discharge status: Home / Self Care | Payer: Self-pay | Source: Ambulatory Visit | Attending: Obstetrics and Gynecology

## 2011-02-12 ENCOUNTER — Encounter (HOSPITAL_COMMUNITY): Payer: Self-pay

## 2011-02-12 SURGERY — LIGATION, FALLOPIAN TUBE, POSTPARTUM
Anesthesia: Epidural | Laterality: Bilateral

## 2011-02-12 MED ORDER — FAMOTIDINE 20 MG PO TABS
40.0000 mg | ORAL_TABLET | Freq: Once | ORAL | Status: AC
  Administered 2011-02-12: 40 mg via ORAL
  Filled 2011-02-12: qty 2

## 2011-02-12 MED ORDER — METOCLOPRAMIDE HCL 10 MG PO TABS
10.0000 mg | ORAL_TABLET | Freq: Once | ORAL | Status: AC
  Administered 2011-02-12: 10 mg via ORAL
  Filled 2011-02-12: qty 1

## 2011-02-12 SURGICAL SUPPLY — 18 items
CHLORAPREP W/TINT 26ML (MISCELLANEOUS) ×1 IMPLANT
CLOTH BEACON ORANGE TIMEOUT ST (SAFETY) ×1 IMPLANT
GLOVE BIOGEL PI IND STRL 7.0 (GLOVE) ×1 IMPLANT
GLOVE BIOGEL PI INDICATOR 7.0 (GLOVE)
GLOVE ECLIPSE 7.0 STRL STRAW (GLOVE) ×2 IMPLANT
GOWN PREVENTION PLUS LG XLONG (DISPOSABLE) ×1 IMPLANT
GOWN PREVENTION PLUS XLARGE (GOWN DISPOSABLE) ×1 IMPLANT
NEEDLE HYPO 22GX1.5 SAFETY (NEEDLE) ×1 IMPLANT
NS IRRIG 1000ML POUR BTL (IV SOLUTION) ×1 IMPLANT
PACK ABDOMINAL MINOR (CUSTOM PROCEDURE TRAY) ×1 IMPLANT
SPONGE LAP 4X18 X RAY DECT (DISPOSABLE) IMPLANT
SUT VIC AB 0 CT1 27 (SUTURE)
SUT VIC AB 0 CT1 27XBRD ANBCTR (SUTURE) ×1 IMPLANT
SUT VICRYL 4-0 PS2 18IN ABS (SUTURE) ×1 IMPLANT
SYR CONTROL 10ML LL (SYRINGE) ×1 IMPLANT
TOWEL OR 17X24 6PK STRL BLUE (TOWEL DISPOSABLE) ×2 IMPLANT
TRAY FOLEY CATH 14FR (SET/KITS/TRAYS/PACK) ×1 IMPLANT
WATER STERILE IRR 1000ML POUR (IV SOLUTION) ×1 IMPLANT

## 2011-02-12 NOTE — Anesthesia Preprocedure Evaluation (Deleted)
Anesthesia Evaluation  Patient identified by MRN, date of birth, ID band Patient awake    Reviewed: Allergy & Precautions, H&P , Patient's Chart, lab work & pertinent test results  Airway Mallampati: IV TM Distance: >3 FB Neck ROM: full    Dental No notable dental hx.    Pulmonary neg pulmonary ROS,  clear to auscultation  Pulmonary exam normal       Cardiovascular neg cardio ROS regular Normal    Neuro/Psych Negative Neurological ROS  Negative Psych ROS   GI/Hepatic negative GI ROS, Neg liver ROS,   Endo/Other  Negative Endocrine ROSMorbid obesity  Renal/GU negative Renal ROS     Musculoskeletal   Abdominal   Peds  Hematology negative hematology ROS (+)   Anesthesia Other Findings   Reproductive/Obstetrics (+) Pregnancy                           Anesthesia Physical Anesthesia Plan Anesthesia Quick Evaluation

## 2011-02-12 NOTE — Progress Notes (Signed)
PSYCHOSOCIAL ASSESSMENT ~ MATERNAL/CHILD Name:  Bianca Cantrell Age 25 D Referral Date  02/12/11 Reason/Source  Request for Car Seat  I. FAMILY/HOME ENVIRONMENT A. Child's Legal Guardian  Parent(s) X Precious Haws parent    DSS  Name  Bianca Cantrell DOB  April 15, 1985 Age  [address] Address  [address], [place], [place]  [address]  Name  Bianca Cantrell DOB Age  Address Same as above Other Household Members/Support Persons Name  Bianca Cantrell Relationship Brother DOB  4 y/o        Name  Bianca Cantrell                   Relationship  Brother                   DOB  2 y/o        Name  Bianca Cantrell                   Relationship  Grandmother                   DOB                   Name                   Relationship                   DOB C.   Other Support   II. PSYCHOSOCIAL DATA A. Information Source  Patient Interview X  Family Interview           Other B. Optometrist  [employer]  Medicaid  sYe   [employer]                                  Self Pay   Food Stamps   Yes   WIC Yes  Work Scientist, physiological Housing      Section 8     Maternity Care Coordination/Child Service Coordination/Early Intervention    School  Grade      Other Cultural and Environment Information Cultural Issues Impacting Care  III. STRENGTHS  Supportive family/friends Yes  Adequate Resources  Yes Compliance with medical plan  Yes  Home prepared for Child (including basic supplies)  No Understanding of illness    N/A       Other  IV. RISK FACTORS AND CURRENT PROBLEMS      No Problems Note   Substance Abuse                                             Pt Family             Mental Illness     Pt Family               Family/Relationship Issues   Pt Family      Abuse/Neglect/Domestic Violence   Pt Family   Financial Resources     Pt Family  Transportation     Pt Family  DSS Involvement    Pt Family  Adjustment to  Illness    Pt Family   Knowledge/Cognitive Deficit  Pt Family   Compliance with Treatment   Pt Family   Basic Needs (food, housing, etc)  Pt requesting supplies Family  Housing Concerns    Pt Family  Other             V. SOCIAL WORK ASSESSMENT CSW was consulted for request for car seat.  Spoke with house coverage and pt is agreeable to the $30 fee.  While discussing car seat pt expressed concern for having enough clothes and need to obtain a bassinet.  House coverage will look into any possible clothing items to help pt as well as CSW will have weekday CSW check with [employer] for possible assistance with bassinet. Pt did not express any concerns with insurance or support from family.  Will follow up with pt once information on supplies is obtained.       VI. SOCIAL WORK PLAN (In [place]) No Further Intervention Required/No Barriers to Discharge Psychosocial Support and Ongoing Assessment of Needs Patient/Family  Education Child Protective Services Report  [place]        Date Information/Referral to [facility]   Other

## 2011-02-12 NOTE — Anesthesia Postprocedure Evaluation (Signed)
  Anesthesia Post-op Note  Patient: Bianca Cantrell  Procedure(s) Performed: * No procedures listed *  Patient Location: Mother/Baby  Anesthesia Type: Epidural  Level of Consciousness: awake, alert  and oriented  Airway and Oxygen Therapy: Patient Spontanous Breathing  Post-op Pain: mild  Post-op Assessment: Patient's Cardiovascular Status Stable, Respiratory Function Stable, Patent Airway, No signs of Nausea or vomiting and Pain level controlled  Post-op Vital Signs: stable  Complications: No apparent anesthesia complications

## 2011-02-12 NOTE — Progress Notes (Signed)
Post Partum Day 1 Subjective: no complaints, up ad lib, voiding, tolerating PO, + flatus and NPO this morning for BTL.  Feeling well overall.   Objective: Blood pressure 96/58, pulse 81, temperature 98.2 F (36.8 C), temperature source Oral, resp. rate 18, height 4' 10.5" (1.486 m), weight 107.14 kg (236 lb 3.2 oz), last menstrual period 04/22/2010, SpO2 97.00%.  Physical Exam:  General: alert, cooperative and no distress Cardiac: RRR Resp: CTAB Lochia: appropriate Uterine Fundus: firm DVT Evaluation: Negative Homan's sign. No cords or calf tenderness. No significant calf/ankle edema.   Basename 02/11/11 0742  HGB 12.2  HCT 35.8*    Assessment/Plan: Plan for discharge tomorrow, Contraception: BTL, breast and bottle feeding   LOS: 1 day   O'Grady, Bianca Cantrell 02/12/2011, 7:34 AM

## 2011-02-13 ENCOUNTER — Encounter (HOSPITAL_COMMUNITY): Payer: Self-pay | Admitting: *Deleted

## 2011-02-13 MED ORDER — OXYCODONE-ACETAMINOPHEN 5-325 MG PO TABS: 1.0000 | ORAL_TABLET | ORAL | Status: AC | PRN

## 2011-02-13 MED ORDER — IBUPROFEN 600 MG PO TABS: 600.0000 mg | ORAL_TABLET | Freq: Four times a day (QID) | ORAL | Status: DC | PRN

## 2011-02-13 NOTE — Progress Notes (Signed)
Post Partum Day 2  Subjective: no complaints, up ad lib, voiding, tolerating PO and + flatus  Objective: Blood pressure 115/76, pulse 83, temperature 98.9 F (37.2 C), temperature source Oral, resp. rate 18, height 4' 10.5" (1.486 m), weight 107.14 kg (236 lb 3.2 oz), last menstrual period 04/22/2010, SpO2 97.00%.  Physical Exam:  General: alert, cooperative, appears stated age and no distress Lochia: appropriate Uterine Fundus: firm Incision: n/a DVT Evaluation: No evidence of DVT seen on physical exam. Negative Homan's sign. No cords or calf tenderness. No significant calf/ankle edema.   Basename 02/11/11 0742  HGB 12.2  HCT 35.8*    Assessment/Plan: Discharge home, Breastfeeding and Contraception Implanon/Nexplanon, no longer desires BTL To make pp appointment w/ clinic for 6wks   LOS: 2 days   Joellyn Haff, SNM 02/13/2011, 8:13 AM

## 2011-02-13 NOTE — Progress Notes (Signed)
UR chart review completed.  

## 2011-02-13 NOTE — Discharge Summary (Signed)
Obstetric Discharge Summary Reason for Admission: rupture of membranes Prenatal Procedures: ultrasound Intrapartum Procedures: spontaneous vaginal delivery, GBS prophylaxis Postpartum Procedures: none Complications-Operative and Postpartum: none Hemoglobin  Date Value Range Status  02/11/2011 12.2  12.0-15.0 (g/dL) Final     HCT  Date Value Range Status  02/11/2011 35.8* 36.0-46.0 (%) Final    Discharge Diagnoses: Term Pregnancy-delivered  Discharge Information: Date: 02/13/2011 Activity: pelvic rest Diet: routine Medications: Ibuprofen and Percocet Condition: stable Instructions: refer to practice specific booklet Discharge to: home Follow-up Information    Follow up with [facility]. Make an appointment in 6 weeks.   Contact information:   [address] [place] [address]          Newborn Data: Live born female  Birth Weight: 7 lb 8.3 oz (3410 g) APGAR: ,   Infant on bili blanket- pediatrician in room upon my entering.  Plan is to d/c baby on bili blankets w/ home health to come to pt's house tomorrow.  If unable to set this up- will keep baby 1 more day (per pediatrician). Home with mother.   Plans Implanon, no longer desires BTL Br/bottle feeding  Joellyn Haff, SNM 02/13/2011, 8:09 AM

## 2011-03-07 NOTE — L&D Delivery Note (Signed)
Delivery Note Pt of Dr. Verdell Carmine reports uc's that began at 0300, water broke in car on way in.  Head crowning upon exam on admission, pushed x 2 and at 6:04 AM a viable female was delivered via Vaginal, Spontaneous Delivery (Presentation: LOA ).  APGAR: 9, 9; weight: pending Placenta status: Intact, Spontaneous.  Cord: 3 vessels with the following complications: none  Anesthesia: None  Episiotomy: none Lacerations: none Suture Repair: n/a Est. Blood Loss (mL): 250  Mom to postpartum.  Baby to nursery-stable.  Marge Duncans 01/07/2012, 6:19 AM

## 2011-03-08 ENCOUNTER — Encounter (HOSPITAL_COMMUNITY): Payer: Self-pay | Admitting: *Deleted

## 2011-04-12 ENCOUNTER — Ambulatory Visit: Payer: Medicaid Other | Admitting: Physician Assistant

## 2011-06-18 ENCOUNTER — Emergency Department (INDEPENDENT_AMBULATORY_CARE_PROVIDER_SITE_OTHER)
Admission: EM | Admit: 2011-06-18 | Discharge: 2011-06-18 | Disposition: A | Discharge status: Home / Self Care | Payer: Medicaid Other | Source: Home / Self Care

## 2011-06-18 ENCOUNTER — Inpatient Hospital Stay (HOSPITAL_COMMUNITY)
Admission: AD | Admit: 2011-06-18 | Discharge: 2011-06-18 | Disposition: A | Discharge status: Hospice | Payer: Medicaid Other | Source: Ambulatory Visit | Attending: Obstetrics & Gynecology | Admitting: Obstetrics & Gynecology

## 2011-06-18 ENCOUNTER — Encounter (HOSPITAL_COMMUNITY): Payer: Self-pay

## 2011-06-18 DIAGNOSIS — R109 Unspecified abdominal pain: Secondary | ICD-10-CM | POA: Insufficient documentation

## 2011-06-18 DIAGNOSIS — Z331 Pregnant state, incidental: Secondary | ICD-10-CM

## 2011-06-18 DIAGNOSIS — Z1389 Encounter for screening for other disorder: Secondary | ICD-10-CM

## 2011-06-18 DIAGNOSIS — N949 Unspecified condition associated with female genital organs and menstrual cycle: Secondary | ICD-10-CM

## 2011-06-18 DIAGNOSIS — Z3490 Encounter for supervision of normal pregnancy, unspecified, unspecified trimester: Secondary | ICD-10-CM

## 2011-06-18 DIAGNOSIS — O99891 Other specified diseases and conditions complicating pregnancy: Secondary | ICD-10-CM | POA: Insufficient documentation

## 2011-06-18 DIAGNOSIS — R102 Pelvic and perineal pain: Secondary | ICD-10-CM

## 2011-06-18 LAB — POCT URINALYSIS DIP (DEVICE)
Hgb urine dipstick: NEGATIVE
Leukocytes, UA: NEGATIVE
Nitrite: NEGATIVE
Protein, ur: NEGATIVE mg/dL
Urobilinogen, UA: 1 mg/dL (ref 0.0–1.0)
pH: 5.5 (ref 5.0–8.0)

## 2011-06-18 NOTE — MAU Note (Signed)
Cramping for one week with a +preg test today.  Denies any bleeding.

## 2011-06-18 NOTE — MAU Provider Note (Signed)
History   Pt presents today c/o abd cramping in preg. She was seen earlier at urgent care and was found to have a positive preg test. She states she has had some cramping on an off for the past week. She denies vag dc, bleeding, severe pain, fever, or any other sx. She stopped breast-feeding 1wk ago. She has a 49 month old child.  CSN: [id number]  Arrival date and time: 06/18/11 4098   First Provider Initiated Contact with Patient 06/18/11 2006      Chief Complaint  Patient presents with  . Abdominal Cramping   HPI  OB History    Grav Para Term Preterm Abortions TAB SAB Ect Mult Living   4 3 3  0 1 0 1 0 0 3      Past Medical History  Diagnosis Date  . No pertinent past medical history     Past Surgical History  Procedure Date  . Cholecystectomy 2011  . Appendectomy 2010  . Tubal ligation 02/12/2011    Procedure: POST PARTUM TUBAL LIGATION;  Surgeon: Reva Bores, MD;  Location: [facility];  Service: Gynecology;  Laterality: Bilateral;    Family History  Problem Relation Age of Onset  . Hypertension Mother   . Heart disease Mother   . Hypertension Maternal Grandmother   . Heart disease Maternal Grandmother     History  Substance Use Topics  . Smoking status: Never Smoker   . Smokeless tobacco: Never Used  . Alcohol Use: No    Allergies:  Allergies  Allergen Reactions  . Sulfa Antibiotics Anaphylaxis and Hives  . Sulfur Anaphylaxis  . Other Hives    Mayonnaise     Prescriptions prior to admission  Medication Sig Dispense Refill  . acetaminophen (TYLENOL) 325 MG tablet Take 650 mg by mouth every 6 (six) hours as needed. For pain      . prenatal vitamin w/FE, FA (PRENATAL 1 + 1) 27-1 MG TABS Take 1 tablet by mouth daily.         Review of Systems  Constitutional: Negative for fever and chills.  Eyes: Negative for blurred vision and double vision.  Respiratory: Negative for cough, hemoptysis, sputum production, shortness of breath and wheezing.     Cardiovascular: Negative for chest pain and palpitations.  Gastrointestinal: Positive for abdominal pain. Negative for nausea, vomiting, diarrhea and constipation.  Genitourinary: Negative for dysuria, urgency, frequency and hematuria.  Neurological: Negative for dizziness and headaches.  Psychiatric/Behavioral: Negative for depression and suicidal ideas.   Physical Exam   Blood pressure 127/99, pulse 83, temperature 98.7 F (37.1 C), temperature source Oral, resp. rate 20, height 4\' 11"  (1.499 m), weight 229 lb (103.874 kg), SpO2 100.00%.  Physical Exam  Nursing note and vitals reviewed. Constitutional: She is oriented to person, place, and time. She appears well-developed and well-nourished. No distress.  HENT:  Head: Normocephalic and atraumatic.  Eyes: EOM are normal. Pupils are equal, round, and reactive to light.  GI: Soft. She exhibits no distension and no mass. There is no tenderness. There is no rebound and no guarding.  Neurological: She is alert and oriented to person, place, and time.  Skin: Skin is warm and dry. She is not diaphoretic.  Psychiatric: She has a normal mood and affect. Her behavior is normal. Judgment and thought content normal.    MAU Course  Procedures  Results for orders placed during the hospital encounter of 06/18/11 (from the past 24 hour(s))  POCT URINALYSIS DIP (DEVICE)  Status: Normal   Collection Time   06/18/11  4:19 PM      Component Value Range   Glucose, UA NEGATIVE  NEGATIVE (mg/dL)   Bilirubin Urine NEGATIVE  NEGATIVE    Ketones, ur NEGATIVE  NEGATIVE (mg/dL)   Specific Gravity, Urine 1.015  1.005 - 1.030    Hgb urine dipstick NEGATIVE  NEGATIVE    pH 5.5  5.0 - 8.0    Protein, ur NEGATIVE  NEGATIVE (mg/dL)   Urobilinogen, UA 1.0  0.0 - 1.0 (mg/dL)   Nitrite NEGATIVE  NEGATIVE    Leukocytes, UA NEGATIVE  NEGATIVE   POCT PREGNANCY, URINE     Status: Abnormal   Collection Time   06/18/11  4:20 PM      Component Value Range    Preg Test, Ur POSITIVE (*) NEGATIVE    Bedside US shows single IUP at 11.5wks with good cardiac activity.  Assessment and Plan  Abd pain in preg: discussed with pt at length. She has an identified IUP. She is to begin prenatal care. Discussed diet, activity, risks, and precautions.  Clinton Gallant. Zabrina Brotherton III, DrHSc, MPAS, PA-C  06/18/2011, 8:13 PM

## 2011-06-18 NOTE — Discharge Instructions (Signed)
Go immediately to [facility] ER.

## 2011-06-18 NOTE — ED Notes (Signed)
1 week hx of abdominal cramping.  Has not had a period since daughter was born in December.  No bleeding noted. Denies any urinary difficulty.

## 2011-06-18 NOTE — ED Provider Notes (Signed)
History     CSN: [id number]  Arrival date & time 06/18/11  1450   None     Chief Complaint  Patient presents with  . Abdominal Cramping    1 week hx of abdominal cramping.  Has not had a period since daughter was born in December.  No bleeding noted.     (Consider location/radiation/quality/duration/timing/severity/associated sxs/prior treatment) HPI Comments: Patient presents today with complaints of pelvic cramping for one week. She does get temporary relief with Tylenol, but cramping returns once it wears off. She denies any vaginal bleeding or discharge. She is not using any form of birth control. She delivered a baby 02/11/2011 and has not had a menstrual period since. She also admits to intermittent nausea, no vomiting or diarrhea.   Past Medical History  Diagnosis Date  . No pertinent past medical history     Past Surgical History  Procedure Date  . Cholecystectomy 2011  . Appendectomy 2010  . Tubal ligation 02/12/2011    Procedure: POST PARTUM TUBAL LIGATION;  Surgeon: Reva Bores, MD;  Location: [facility];  Service: Gynecology;  Laterality: Bilateral;    Family History  Problem Relation Age of Onset  . Hypertension Mother   . Heart disease Mother   . Hypertension Maternal Grandmother   . Heart disease Maternal Grandmother     History  Substance Use Topics  . Smoking status: Never Smoker   . Smokeless tobacco: Never Used  . Alcohol Use: No    OB History    Grav Para Term Preterm Abortions TAB SAB Ect Mult Living   4 3 3  0 1 0 1 0 0 3      Review of Systems  Constitutional: Negative for fever and chills.  Respiratory: Negative for cough and shortness of breath.   Cardiovascular: Negative for chest pain.  Gastrointestinal: Positive for nausea. Negative for vomiting and diarrhea.  Genitourinary: Positive for pelvic pain. Negative for dysuria, vaginal bleeding and vaginal discharge.    Allergies  Sulfur; Other; and Sulfa antibiotics  Home Medications     Current Outpatient Rx  Name Route Sig Dispense Refill  . PRENATAL PLUS 27-1 MG PO TABS Oral Take 1 tablet by mouth daily.       BP 121/63  Pulse 80  Temp(Src) 98.4 F (36.9 C) (Oral)  Resp 17  SpO2 98%  Breastfeeding? No  Physical Exam  Nursing note and vitals reviewed. Constitutional: She appears well-developed and well-nourished. No distress.  HENT:  Head: Normocephalic and atraumatic.  Cardiovascular: Normal rate and normal heart sounds.   Pulmonary/Chest: Effort normal and breath sounds normal. No respiratory distress.  Abdominal: Soft. Bowel sounds are normal. She exhibits no distension and no mass. There is no hepatosplenomegaly. There is tenderness in the right lower quadrant, suprapubic area and left lower quadrant. There is no guarding.    ED Course  Procedures (including critical care time)  Labs Reviewed  POCT PREGNANCY, URINE - Abnormal; Notable for the following:    Preg Test, Ur POSITIVE (*)    All other components within normal limits  POCT URINALYSIS DIP (DEVICE)   No results found.   1. Pelvic pain   2. Pregnancy as incidental finding       MDM  Pt discharged to go to [facility] ED for evaluation.         Melody Comas, [place] 06/18/11 [phone]

## 2011-06-18 NOTE — MAU Note (Signed)
Pt states she has irrgular periods-she had a NSVD in DEC.-since then she has not had a period-states she has been cramping x 1 week-she thought she was starting her period-went to [facility] and had a + UPT-they sent her here for further evaluation

## 2011-06-19 NOTE — MAU Provider Note (Signed)
Medical Screening exam and patient care preformed by advanced practice provider.  Agree with the above management.  

## 2011-06-20 NOTE — ED Provider Notes (Signed)
Medical screening examination/treatment/procedure(s) were performed by non-physician practitioner and as supervising physician I was immediately available for consultation/collaboration.  Sophia Cubero M. MD  Algie Cales M Zain Bingman, MD 06/20/11 0014 

## 2011-07-08 ENCOUNTER — Inpatient Hospital Stay (HOSPITAL_COMMUNITY)
Admission: AD | Admit: 2011-07-08 | Discharge: 2011-07-08 | Disposition: A | Discharge status: Home / Self Care | Payer: Medicaid Other | Source: Ambulatory Visit | Attending: Family Medicine | Admitting: Family Medicine

## 2011-07-08 ENCOUNTER — Encounter (HOSPITAL_COMMUNITY): Payer: Self-pay

## 2011-07-08 DIAGNOSIS — O2390 Unspecified genitourinary tract infection in pregnancy, unspecified trimester: Secondary | ICD-10-CM | POA: Insufficient documentation

## 2011-07-08 DIAGNOSIS — O99891 Other specified diseases and conditions complicating pregnancy: Secondary | ICD-10-CM | POA: Insufficient documentation

## 2011-07-08 DIAGNOSIS — O2340 Unspecified infection of urinary tract in pregnancy, unspecified trimester: Secondary | ICD-10-CM

## 2011-07-08 DIAGNOSIS — N390 Urinary tract infection, site not specified: Secondary | ICD-10-CM | POA: Insufficient documentation

## 2011-07-08 DIAGNOSIS — R51 Headache: Secondary | ICD-10-CM | POA: Insufficient documentation

## 2011-07-08 DIAGNOSIS — M549 Dorsalgia, unspecified: Secondary | ICD-10-CM | POA: Insufficient documentation

## 2011-07-08 HISTORY — DX: Calculus of gallbladder without cholecystitis without obstruction: K80.20

## 2011-07-08 HISTORY — DX: Chlamydial infection, unspecified: A74.9

## 2011-07-08 LAB — URINALYSIS, ROUTINE W REFLEX MICROSCOPIC
Bilirubin Urine: NEGATIVE
Glucose, UA: NEGATIVE mg/dL
Ketones, ur: NEGATIVE mg/dL
Protein, ur: 100 mg/dL — AB
Urobilinogen, UA: 1 mg/dL (ref 0.0–1.0)

## 2011-07-08 LAB — CBC
HCT: 36.9 % (ref 36.0–46.0)
Hemoglobin: 12.7 g/dL (ref 12.0–15.0)
MCV: 87 fL (ref 78.0–100.0)
RBC: 4.24 MIL/uL (ref 3.87–5.11)
WBC: 12.1 10*3/uL — ABNORMAL HIGH (ref 4.0–10.5)

## 2011-07-08 LAB — DIFFERENTIAL
Basophils Absolute: 0 10*3/uL (ref 0.0–0.1)
Eosinophils Relative: 1 % (ref 0–5)
Lymphocytes Relative: 19 % (ref 12–46)
Lymphs Abs: 2.3 10*3/uL (ref 0.7–4.0)
Monocytes Absolute: 0.8 10*3/uL (ref 0.1–1.0)
Monocytes Relative: 6 % (ref 3–12)
Neutro Abs: 8.9 10*3/uL — ABNORMAL HIGH (ref 1.7–7.7)

## 2011-07-08 LAB — URINE MICROSCOPIC-ADD ON

## 2011-07-08 MED ORDER — ONDANSETRON 4 MG PO TBDP
4.0000 mg | ORAL_TABLET | Freq: Four times a day (QID) | ORAL | Status: DC | PRN
  Administered 2011-07-08: 4 mg via ORAL
  Filled 2011-07-08: qty 1

## 2011-07-08 MED ORDER — CEPHALEXIN 500 MG PO CAPS: 500.0000 mg | ORAL_CAPSULE | Freq: Three times a day (TID) | ORAL | Status: AC

## 2011-07-08 MED ORDER — CEFTRIAXONE SODIUM 1 G IJ SOLR
1.0000 g | Freq: Once | INTRAMUSCULAR | Status: AC
  Administered 2011-07-08: 1 g via INTRAMUSCULAR
  Filled 2011-07-08: qty 10

## 2011-07-08 MED ORDER — OXYCODONE-ACETAMINOPHEN 5-325 MG PO TABS
1.0000 | ORAL_TABLET | Freq: Four times a day (QID) | ORAL | Status: DC | PRN
  Administered 2011-07-08: 2 via ORAL
  Filled 2011-07-08: qty 2
  Filled 2011-07-08: qty 1

## 2011-07-08 NOTE — MAU Note (Signed)
Patient is here with c/o headache and lower back pain for 2 days. She states that she also have pain when she voids. Denies any vaginal bleeding or discharge. Her next ob appt is may14th. She has not taken any medication at home for the headache

## 2011-07-08 NOTE — ED Provider Notes (Signed)
First Provider Initiated Contact with Patient 07/08/11 1954      Chief Complaint:  Back Pain and Headache   Bianca Cantrell is  26 y.o. [id number] at [redacted]w[redacted]d presents complaining of Back Pain and Headache .Also c/o dysuria.  Denies fever.  Is nauseated, but has been since finding out she was pregnant.  Denies fevers  Obstetrical/Gynecological History: Menstrual History: OB History    Grav Para Term Preterm Abortions TAB SAB Ect Mult Living   5 3 3  0 1 0 1 0 0 3       Patient's last menstrual period was 04/22/2010.     Past Medical History: Past Medical History  Diagnosis Date  . No pertinent past medical history   . Gallstones   . Chlamydia     Past Surgical History: Past Surgical History  Procedure Date  . Cholecystectomy 2011  . Appendectomy 2010    Family History: Family History  Problem Relation Age of Onset  . Hypertension Mother   . Heart disease Mother   . Hypertension Maternal Grandmother   . Heart disease Maternal Grandmother     Social History: History  Substance Use Topics  . Smoking status: Never Smoker   . Smokeless tobacco: Never Used  . Alcohol Use: No    Allergies:  Allergies  Allergen Reactions  . Sulfa Antibiotics Anaphylaxis and Hives  . Sulfur Anaphylaxis  . Other Hives    Mayonnaise     Meds:  Prescriptions prior to admission  Medication Sig Dispense Refill  . Prenatal Vit-Fe Fumarate-FA (PRENATAL MULTIVITAMIN) TABS Take 1 tablet by mouth daily.        Review of Systems - Please refer to the aforementioned patients' reports.     Physical Exam  Blood pressure 114/53, pulse 103, temperature 99 F (37.2 C), temperature source Oral, resp. rate 18, height 4' 10.25" (1.48 m), weight 103.193 kg (227 lb 8 oz), last menstrual period 04/22/2010, unknown if currently breastfeeding. GENERAL: Well-developed, well-nourished female in no acute distress.  LUNGS: Clear to auscultation bilaterally.  HEART: Regular rate and rhythm. ABDOMEN:  Soft, nontender, nondistended, gravid.  + CVAT bilaterally, worse on Left EXTREMITIES: Nontender, no edema, 2+ distal pulses.   Labs: Recent Results (from the past 24 hour(s))  URINALYSIS, ROUTINE W REFLEX MICROSCOPIC   Collection Time   07/08/11  7:25 PM      Component Value Range   Color, Urine YELLOW  YELLOW    APPearance HAZY (*) CLEAR    Specific Gravity, Urine 1.020  1.005 - 1.030    pH 6.0  5.0 - 8.0    Glucose, UA NEGATIVE  NEGATIVE (mg/dL)   Hgb urine dipstick LARGE (*) NEGATIVE    Bilirubin Urine NEGATIVE  NEGATIVE    Ketones, ur NEGATIVE  NEGATIVE (mg/dL)   Protein, ur 454 (*) NEGATIVE (mg/dL)   Urobilinogen, UA 1.0  0.0 - 1.0 (mg/dL)   Nitrite POSITIVE (*) NEGATIVE    Leukocytes, UA LARGE (*) NEGATIVE   URINE MICROSCOPIC-ADD ON   Collection Time   07/08/11  7:25 PM      Component Value Range   Squamous Epithelial / LPF RARE  RARE    WBC, UA TOO NUMEROUS TO COUNT  <3 (WBC/hpf)   Bacteria, UA FEW (*) RARE    Imaging Studies:  No results found.  Assessment: Bianca Cantrell is  26 y.o. [phone] at [redacted]w[redacted]d presents with urinary tract infection vs pyelonephritis  Plan: Discussed POC with Dr. Shawnie Pons.  Will give IM  rocephin, PO keflex Rx, and pt to come back to MAU tomorrow for reassessment  CRESENZO-DISHMAN,Juline Sanderford 5/4/20138:04 PM

## 2011-07-08 NOTE — Discharge Instructions (Signed)
Urinary Tract Infection in Pregnancy A urinary tract infection (UTI) is a bacterial infection of the urinary tract. Infection of the urinary tract can include the ureters, kidneys (pyelonephritis), bladder (cystitis), and urethra (urethritis). All pregnant women should be screened for bacteria in the urinary tract. Identifying and treating a UTI will decrease the risk of preterm labor and developing more serious infections in both the mother and baby. CAUSES Bacteria germs cause almost all UTIs. There are many factors that can increase your chances of getting a UTI during pregnancy. These include:  Having a short urethra.   Poor toilet and hygiene habits.   Sexual intercourse.   Blockage of urine along the urinary tract.   Problems with the pelvic muscles or nerves.   Diabetes.   Obesity.   Bladder problems after having several children.   Previous history of UTI.  SYMPTOMS   Pain, burning, or a stinging feeling when urinating.   Suddenly feeling the need to urinate right away (urgency).   Loss of bladder control (urinary incontinence).   Frequent urination, more than is common with pregnancy.   Lower abdominal or back discomfort.   Bad smelling urine.   Cloudy urine.   Blood in the urine (hematuria).   Fever.  When the kidneys are infected, the symptoms may be:  Back pain.   Flank pain on the right side more so than the left.   Fever.   Chills.   Nausea.   Vomiting.  DIAGNOSIS   Urine tests.   Additional tests and procedures may include:   Ultrasound of the kidneys, ureters, bladder, and urethra.   Looking in the bladder with a lighted tube (cystoscopy).   Certain X-ray studies only when absolutely necessary.  Finding out the results of your test Ask when your test results will be ready. Make sure you get your test results. TREATMENT  Antibiotic medicine by mouth.   Antibiotics given through the vein (intravenously), if needed.  HOME CARE  INSTRUCTIONS   Take your antibiotics as directed. Finish them even if you start to feel better. Only take medicine as directed by your caregiver.   Drink enough fluids to keep your urine clear or pale yellow.   Do not have sexual intercourse until the infection is gone and your caregiver says it is okay.   Make sure you are tested for UTIs throughout your pregnancy if you get one. These infections often come back.  Preventing a UTI in the future:  Practice good toilet habits. Always wipe from front to back. Use the tissue only once.   Do not hold your urine. Empty your bladder as soon as possible when the urge comes.   Do not douche or use deodorant sprays.   Wash with soap and warm water around the genital area and the anus.   Empty your bladder before and after sexual intercourse.   Wear underwear with a cotton crotch.   Avoid caffeine and carbonated drinks. They can irritate the bladder.   Drink cranberry juice or take cranberry pills. This may decrease the risk of getting a UTI.   Do not drink alcohol.   Keep all your appointments and tests as scheduled.  SEEK MEDICAL CARE IF:   Your symptoms get worse.   You are still having fevers 2 or more days after treatment begins.   You develop a rash.   You feel that you are having problems with medicines prescribed.   You develop abnormal vaginal discharge.  SEEK IMMEDIATE MEDICAL   CARE IF:   You develop back or flank pain.   You develop chills.   You have blood in your urine.   You develop nausea and vomiting.   You develop contractions of your uterus.   You have a gush of fluid from the vagina.  MAKE SURE YOU:   Understand these instructions.   Will watch your condition.   Will get help right away if you are not doing well or get worse.  Document Released: 06/17/2010 Document Revised: 02/09/2011 Document Reviewed: 06/17/2010 [employer] Patient Information 2012 [employer]. 

## 2011-07-09 NOTE — ED Provider Notes (Signed)
Chart reviewed and agree with management and plan.  

## 2011-07-10 LAB — URINE CULTURE
Culture  Setup Time: 201305050124
Special Requests: NORMAL

## 2011-12-25 ENCOUNTER — Emergency Department (HOSPITAL_COMMUNITY)
Admission: EM | Admit: 2011-12-25 | Discharge: 2011-12-26 | Disposition: A | Discharge status: Home / Self Care | Payer: Medicaid Other | Attending: Emergency Medicine | Admitting: Emergency Medicine

## 2011-12-25 ENCOUNTER — Encounter (HOSPITAL_COMMUNITY): Payer: Self-pay | Admitting: Emergency Medicine

## 2011-12-25 DIAGNOSIS — O2690 Pregnancy related conditions, unspecified, unspecified trimester: Secondary | ICD-10-CM | POA: Insufficient documentation

## 2011-12-25 DIAGNOSIS — G5600 Carpal tunnel syndrome, unspecified upper limb: Secondary | ICD-10-CM | POA: Insufficient documentation

## 2011-12-25 DIAGNOSIS — Z8619 Personal history of other infectious and parasitic diseases: Secondary | ICD-10-CM | POA: Insufficient documentation

## 2011-12-25 DIAGNOSIS — M79609 Pain in unspecified limb: Secondary | ICD-10-CM | POA: Insufficient documentation

## 2011-12-25 DIAGNOSIS — Z8719 Personal history of other diseases of the digestive system: Secondary | ICD-10-CM | POA: Insufficient documentation

## 2011-12-25 NOTE — ED Notes (Signed)
Pt states she is having pain in her right forearm and she is having tingling in her wrist and hand Pt states she has not injured it in any way  Pt states sxs started about a week and a half ago  Pt states she is [redacted] weeks pregnant

## 2011-12-26 MED ORDER — ACETAMINOPHEN 500 MG PO TABS: 500.0000 mg | ORAL_TABLET | Freq: Four times a day (QID) | ORAL | Status: DC | PRN

## 2011-12-26 MED ORDER — ACETAMINOPHEN 500 MG PO TABS
1000.0000 mg | ORAL_TABLET | Freq: Once | ORAL | Status: AC
  Administered 2011-12-26: 1000 mg via ORAL
  Filled 2011-12-26: qty 2

## 2011-12-26 NOTE — ED Notes (Addendum)
Pt reports rt wrist pain and tingling x2 weeks, denies any known mechanism of injury - states she has three children and could have strained it at any time. Pt is also [redacted]weeks pregnant. Pt A&Ox4, in no acute distress, pleasant on assessment.

## 2011-12-26 NOTE — ED Provider Notes (Signed)
History     CSN: [id number]  Arrival date & time 12/25/11  2119   First MD Initiated Contact with Patient 12/25/11 2321      Chief Complaint  Patient presents with  . Arm Pain    (Consider location/radiation/quality/duration/timing/severity/associated sxs/prior treatment) HPI Comments: Patient is a 26 year old G4P3 female who presents with right wrist pain that started 10 days ago. The pain started gradually and progressively worsened. The pain is sharp and severe and radiates to her right palm. Associated symptoms include right palm numbness and tingling. Patient denies injury. Wrist movement makes the pain worse. Nothing makes the pain better. Patient denies weakness, coolness, paleness of extremity, obvious deformity.    Past Medical History  Diagnosis Date  . No pertinent past medical history   . Gallstones   . Chlamydia     Past Surgical History  Procedure Date  . Cholecystectomy 2011  . Appendectomy 2010    Family History  Problem Relation Age of Onset  . Hypertension Mother   . Heart disease Mother   . Hypertension Maternal Grandmother   . Heart disease Maternal Grandmother     History  Substance Use Topics  . Smoking status: Never Smoker   . Smokeless tobacco: Never Used  . Alcohol Use: No    OB History    Grav Para Term Preterm Abortions TAB SAB Ect Mult Living   5 3 3  0 1 0 1 0 0 3      Review of Systems  Musculoskeletal: Positive for arthralgias.  Neurological: Positive for numbness.  All other systems reviewed and are negative.    Allergies  Sulfa antibiotics; Sulfur; and Other  Home Medications   Current Outpatient Rx  Name Route Sig Dispense Refill  . PRENATAL MULTIVITAMIN CH Oral Take 1 tablet by mouth daily.      LMP 04/22/2010  Physical Exam  Nursing note and vitals reviewed. Constitutional: She is oriented to person, place, and time. She appears well-developed and well-nourished. No distress.  HENT:  Head: Normocephalic and  atraumatic.  Eyes: Conjunctivae normal are normal.  Neck: Normal range of motion. Neck supple.  Cardiovascular: Normal rate and regular rhythm.  Exam reveals no gallop and no friction rub.   No murmur heard. Pulmonary/Chest: Effort normal and breath sounds normal. She has no wheezes. She has no rales. She exhibits no tenderness.  Abdominal: Soft. There is no tenderness.  Musculoskeletal: Normal range of motion.       Right wrist Tinel's sign positive. Right wrist tender to palpation.   Neurological: She is alert and oriented to person, place, and time. Coordination normal.       Grip strength equal bilaterally. Strength and sensation equal and intact bilaterally. Speech is goal-oriented. Moves limbs without ataxia.   Skin: Skin is warm and dry. She is not diaphoretic.  Psychiatric: She has a normal mood and affect. Her behavior is normal.    ED Course  Procedures (including critical care time)  Labs Reviewed - No data to display No results found.   1. Carpal tunnel syndrome       MDM  12:07 AM Patient presents with right wrist pain associated with right palm numbness and tingling. Patient likely has carpal tunnel. I will order a wrist splint and tylenol, since the patient is [redacted] weeks pregnant, for her to take as needed for pain. Patient can be discharged and follow up with worsening or concerning symptoms.        Emilia Beck,  PA-C 12/26/11 0015

## 2011-12-26 NOTE — ED Notes (Signed)
Rx given x1 D/c instructions reviewed w/ pt and family - pt and family deny any further questions or concerns at present. D/c instructions reviewed w/ pt - pt denies any further questions or concerns at present.

## 2011-12-28 NOTE — ED Provider Notes (Signed)
Medical screening examination/treatment/procedure(s) were performed by non-physician practitioner and as supervising physician I was immediately available for consultation/collaboration.  Toy Baker, MD 12/28/11 2229

## 2012-01-05 ENCOUNTER — Encounter (HOSPITAL_COMMUNITY): Payer: Self-pay | Admitting: *Deleted

## 2012-01-05 ENCOUNTER — Inpatient Hospital Stay (HOSPITAL_COMMUNITY)
Admission: AD | Admit: 2012-01-05 | Discharge: 2012-01-05 | Disposition: A | Discharge status: Home / Self Care | Payer: Medicaid Other | Source: Ambulatory Visit | Attending: Obstetrics & Gynecology | Admitting: Obstetrics & Gynecology

## 2012-01-05 DIAGNOSIS — O26899 Other specified pregnancy related conditions, unspecified trimester: Secondary | ICD-10-CM

## 2012-01-05 DIAGNOSIS — O479 False labor, unspecified: Secondary | ICD-10-CM | POA: Insufficient documentation

## 2012-01-05 DIAGNOSIS — N949 Unspecified condition associated with female genital organs and menstrual cycle: Secondary | ICD-10-CM | POA: Insufficient documentation

## 2012-01-05 DIAGNOSIS — N898 Other specified noninflammatory disorders of vagina: Secondary | ICD-10-CM

## 2012-01-05 HISTORY — DX: Unspecified abnormal cytological findings in specimens from cervix uteri: R87.619

## 2012-01-05 HISTORY — DX: Anemia, unspecified: D64.9

## 2012-01-05 HISTORY — DX: Dermatitis, unspecified: L30.9

## 2012-01-05 HISTORY — DX: Unspecified infectious disease: B99.9

## 2012-01-05 HISTORY — DX: Reserved for concepts with insufficient information to code with codable children: IMO0002

## 2012-01-05 HISTORY — DX: Sickle-cell trait: D57.3

## 2012-01-05 LAB — WET PREP, GENITAL: Clue Cells Wet Prep HPF POC: NONE SEEN

## 2012-01-05 LAB — POCT FERN TEST

## 2012-01-05 NOTE — MAU Note (Signed)
Pt not wearing pad at present

## 2012-01-05 NOTE — MAU Note (Signed)
Pt states has not been seen in 2 months. Due date is today per pt. Denies bleeding. Noted watery vaginal discharge, first a trickle then thickened up a bit, then became more watery again.

## 2012-01-05 NOTE — MAU Note (Signed)
Doesn't think she peed on self.  Watery d/c was white. D/c is now thick, wore pad- nothing on it.  Has not seen docor in 1-31months. Was dc'd from [place], ? Something with medicaid.  States contractions have gotten stronger

## 2012-01-05 NOTE — MAU Provider Note (Signed)
History     CSN: [id number]  Arrival date and time: 01/05/12 1630   None     No chief complaint on file.  HPI 26 y.o. [id number] at [redacted]w[redacted]d with white watery discharge around 4 PM. Thickened up since went to bathroom. No itching. No bleeding. Baby moving okay. Contractions off and on for a few days. Slowing down now. No PNC in 2 months. No complications of pregnancy before that. No hx preeclampsia or diabetes. Was seeing [facility] until a month ago when she had problems with her Medicaid.   OB History    Grav Para Term Preterm Abortions TAB SAB Ect Mult Living   4 3 3  0 0 0 0 0 0 3    All babies delivered at term, all SVD. Biggest 7 lb 2 oz.    Past Medical History  Diagnosis Date  . No pertinent past medical history   . Gallstones   . Chlamydia   . Anemia   . Sickle cell trait   . Infection     urinary tract infection  . Eczema   . Abnormal Pap smear     HPV    Past Surgical History  Procedure Date  . Cholecystectomy 2011  . Appendectomy 2010    Family History  Problem Relation Age of Onset  . Hypertension Mother   . Heart disease Mother   . Hypertension Maternal Grandmother   . Heart disease Maternal Grandmother   . Other Neg Hx     History  Substance Use Topics  . Smoking status: Former Smoker    Types: Cigarettes  . Smokeless tobacco: Never Used   Comment: during preg  . Alcohol Use: No    Allergies:  Allergies  Allergen Reactions  . Sulfa Antibiotics Anaphylaxis and Hives  . Sulfur Anaphylaxis  . Other Hives    Mayonnaise     Prescriptions prior to admission  Medication Sig Dispense Refill  . acetaminophen (TYLENOL) 500 MG tablet Take 1 tablet (500 mg total) by mouth every 6 (six) hours as needed for pain.  30 tablet  0  . Prenatal Vit-Fe Fumarate-FA (PRENATAL MULTIVITAMIN) TABS Take 1 tablet by mouth daily.        Review of Systems  Constitutional: Negative for fever and chills.  Eyes: Negative for blurred vision and double vision.    Respiratory: Negative for shortness of breath.   Gastrointestinal: Positive for diarrhea. Negative for nausea, vomiting, abdominal pain and constipation.  Genitourinary: Negative for dysuria and urgency.  Musculoskeletal: Positive for back pain.  Neurological: Negative for dizziness and headaches.   Physical Exam   Blood pressure 127/72, pulse 83, temperature 97.9 F (36.6 C), temperature source Oral, resp. rate 18, height 4\' 10"  (1.473 m), weight 105.745 kg (233 lb 2 oz), last menstrual period 04/22/2010, not currently breastfeeding.  Physical Exam  Constitutional: She is oriented to person, place, and time.       Obese, no acute distress  HENT:  Head: Normocephalic and atraumatic.  Eyes: Conjunctivae normal and EOM are normal.  Neck: Normal range of motion. Neck supple.  Cardiovascular: Normal rate, regular rhythm and normal heart sounds.   Respiratory: Effort normal and breath sounds normal. No respiratory distress.  GI: Soft. There is no tenderness. There is no rebound and no guarding.  Genitourinary:       Normal external genitalia. Thin gray watery discharge and some thick white discharge. No blood. No CMT.  Musculoskeletal: Normal range of motion. She exhibits no tenderness.  Neurological: She is alert and oriented to person, place, and time.  Skin: Skin is warm and dry.  Psychiatric: She has a normal mood and affect.   Dilation: 1.5 Effacement (%): Thick Cervical Position: Posterior Soft Vertex Exam by:: Dr. Thad Ranger  GBS posditive on 11/29/11  Fern negative Amnisure negative Results for orders placed during the hospital encounter of 01/05/12 (from the past 24 hour(s))  WET PREP, GENITAL     Status: Abnormal   Collection Time   01/05/12  7:21 PM      Component Value Range   Yeast Wet Prep HPF POC NONE SEEN  NONE SEEN   Trich, Wet Prep NONE SEEN  NONE SEEN   Clue Cells Wet Prep HPF POC NONE SEEN  NONE SEEN   WBC, Wet Prep HPF POC FEW (*) NONE SEEN  POCT FERN TEST      Status: Normal   Collection Time   01/05/12  7:33 PM      Component Value Range   POCT Fern Test      AMNISURE RUPTURE OF MEMBRANE (ROM)     Status: Normal   Collection Time   01/05/12  8:00 PM      Component Value Range   Amnisure ROM NEGATIVE       MAU Course  Procedures  NST:  120-125, moderate variability, accels present, no decels CTX:  q 30 minutes    Assessment and Plan  26 y.o. [id number] at [redacted]w[redacted]d with  - Not in labor, not ruptured - Home with labor precautions  Napoleon Form 01/05/2012, 7:05 PM

## 2012-01-06 LAB — GC/CHLAMYDIA PROBE AMP, GENITAL
Chlamydia, DNA Probe: NEGATIVE
GC Probe Amp, Genital: NEGATIVE

## 2012-01-07 ENCOUNTER — Inpatient Hospital Stay (HOSPITAL_COMMUNITY)
Admission: AD | Admit: 2012-01-07 | Discharge: 2012-01-09 | DRG: 775 | Disposition: A | Discharge status: Home / Self Care | Payer: Medicaid Other | Source: Ambulatory Visit | Attending: Obstetrics & Gynecology | Admitting: Obstetrics & Gynecology

## 2012-01-07 ENCOUNTER — Encounter (HOSPITAL_COMMUNITY): Payer: Self-pay | Admitting: *Deleted

## 2012-01-07 DIAGNOSIS — Z22330 Carrier of Group B streptococcus: Secondary | ICD-10-CM

## 2012-01-07 DIAGNOSIS — O99892 Other specified diseases and conditions complicating childbirth: Principal | ICD-10-CM | POA: Diagnosis present

## 2012-01-07 MED ORDER — IBUPROFEN 600 MG PO TABS
600.0000 mg | ORAL_TABLET | Freq: Once | ORAL | Status: AC
  Administered 2012-01-07: 600 mg via ORAL
  Filled 2012-01-07: qty 1

## 2012-01-07 MED ORDER — ONDANSETRON HCL 4 MG PO TABS: 4.0000 mg | ORAL_TABLET | ORAL | Status: DC | PRN

## 2012-01-07 MED ORDER — DIBUCAINE 1 % RE OINT: 1.0000 "application " | TOPICAL_OINTMENT | RECTAL | Status: DC | PRN

## 2012-01-07 MED ORDER — PRENATAL MULTIVITAMIN CH
1.0000 | ORAL_TABLET | Freq: Every day | ORAL | Status: DC
  Administered 2012-01-07 – 2012-01-09 (×3): 1 via ORAL
  Filled 2012-01-07 (×3): qty 1

## 2012-01-07 MED ORDER — LANOLIN HYDROUS EX OINT: TOPICAL_OINTMENT | CUTANEOUS | Status: DC | PRN

## 2012-01-07 MED ORDER — WITCH HAZEL-GLYCERIN EX PADS: 1.0000 "application " | MEDICATED_PAD | CUTANEOUS | Status: DC | PRN

## 2012-01-07 MED ORDER — SIMETHICONE 80 MG PO CHEW: 80.0000 mg | CHEWABLE_TABLET | ORAL | Status: DC | PRN

## 2012-01-07 MED ORDER — BENZOCAINE-MENTHOL 20-0.5 % EX AERO
1.0000 "application " | INHALATION_SPRAY | CUTANEOUS | Status: DC | PRN
  Administered 2012-01-07: 1 via TOPICAL
  Filled 2012-01-07: qty 56

## 2012-01-07 MED ORDER — OXYTOCIN 10 UNIT/ML IJ SOLN
INTRAMUSCULAR | Status: AC
  Administered 2012-01-07: 10 [IU] via INTRAMUSCULAR
  Filled 2012-01-07: qty 1

## 2012-01-07 MED ORDER — OXYCODONE-ACETAMINOPHEN 5-325 MG PO TABS
1.0000 | ORAL_TABLET | ORAL | Status: DC | PRN
  Administered 2012-01-07: 1 via ORAL
  Administered 2012-01-07: 2 via ORAL
  Administered 2012-01-07: 1 via ORAL
  Administered 2012-01-07: 2 via ORAL
  Administered 2012-01-08 – 2012-01-09 (×3): 1 via ORAL
  Filled 2012-01-07 (×2): qty 2
  Filled 2012-01-07 (×5): qty 1

## 2012-01-07 MED ORDER — IBUPROFEN 600 MG PO TABS
600.0000 mg | ORAL_TABLET | Freq: Four times a day (QID) | ORAL | Status: DC
  Administered 2012-01-07 – 2012-01-09 (×8): 600 mg via ORAL
  Filled 2012-01-07 (×9): qty 1

## 2012-01-07 MED ORDER — FERROUS SULFATE 325 (65 FE) MG PO TABS
325.0000 mg | ORAL_TABLET | Freq: Two times a day (BID) | ORAL | Status: DC
  Administered 2012-01-07 – 2012-01-09 (×4): 325 mg via ORAL
  Filled 2012-01-07 (×4): qty 1

## 2012-01-07 MED ORDER — INFLUENZA VIRUS VACC SPLIT PF IM SUSP
0.5000 mL | INTRAMUSCULAR | Status: AC
  Administered 2012-01-08: 0.5 mL via INTRAMUSCULAR
  Filled 2012-01-07: qty 0.5

## 2012-01-07 MED ORDER — ZOLPIDEM TARTRATE 5 MG PO TABS: 5.0000 mg | ORAL_TABLET | Freq: Every evening | ORAL | Status: DC | PRN

## 2012-01-07 MED ORDER — SENNOSIDES-DOCUSATE SODIUM 8.6-50 MG PO TABS
2.0000 | ORAL_TABLET | Freq: Every day | ORAL | Status: DC
  Administered 2012-01-07 – 2012-01-08 (×2): 2 via ORAL

## 2012-01-07 MED ORDER — ONDANSETRON HCL 4 MG/2ML IJ SOLN: 4.0000 mg | INTRAMUSCULAR | Status: DC | PRN

## 2012-01-07 MED ORDER — DIPHENHYDRAMINE HCL 25 MG PO CAPS: 25.0000 mg | ORAL_CAPSULE | Freq: Four times a day (QID) | ORAL | Status: DC | PRN

## 2012-01-07 MED ORDER — TETANUS-DIPHTH-ACELL PERTUSSIS 5-2.5-18.5 LF-MCG/0.5 IM SUSP
0.5000 mL | Freq: Once | INTRAMUSCULAR | Status: AC
  Administered 2012-01-08: 0.5 mL via INTRAMUSCULAR
  Filled 2012-01-07: qty 0.5

## 2012-01-07 NOTE — Progress Notes (Signed)
Patient ID: Bianca Cantrell, female   DOB: [birth year], 26 y.o.   MRN: [id number] Patient at term admitted for the dilated then delivered by midwife in MAU placenta spontaneous no lacerations or episiotomy patient had a positive GBS which was not treated because of the time that she was admitted

## 2012-01-07 NOTE — MAU Note (Signed)
ELIZABETH, RN FROM L/D  BANDED BABY.

## 2012-01-07 NOTE — MAU Note (Signed)
PT ARRIVED  - REMOVED FROM CAR WITH W/C- PT SCREAMING.  INTO RM 6- VE - COMPLETE- SROM ON WAY TO HOSPITAL.Marland Kitchen    BOOKER, CNM CALLED- FOR DEL.    AT 0604- DELIVERED VIABLE BABY GIRL.

## 2012-01-08 LAB — RPR: RPR Ser Ql: NONREACTIVE

## 2012-01-08 LAB — CBC
MCH: 29.2 pg (ref 26.0–34.0)
Platelets: 191 10*3/uL (ref 150–400)
RBC: 3.83 MIL/uL — ABNORMAL LOW (ref 3.87–5.11)
WBC: 10.6 10*3/uL — ABNORMAL HIGH (ref 4.0–10.5)

## 2012-01-08 NOTE — Progress Notes (Signed)
Ur chart review completed.  

## 2012-01-08 NOTE — H&P (Signed)
This is Dr. Francoise Ceo dictating the history and physical on Bianca Cantrell she's a 26 year old gravida 4 para 404 at 40 weeks and 1 day [facility] 01/06/2012 she was admitted  f ully dilated and delivered by the midwife in MAU should've positive GBS which was  ut treated because of the  stage the patient was admitted  Past medical history negative Past surgical history negative Social history negative System review negative Physical exam well-developed female in no distress post delivery HEENT negative Breasts negative Lungs clear to P&A Heart regular rhythm no murmurs or gallops Abdomen uterus 20 week postpartum Pelvic deferred Extremities negative

## 2012-01-08 NOTE — Progress Notes (Signed)
Post Partum Day 1 Subjective: no complaints  Objective: Blood pressure 124/62, pulse 90, temperature 98.1 F (36.7 C), temperature source Oral, resp. rate 20, last menstrual period 04/22/2010, unknown if currently breastfeeding.  Physical Exam:  General: alert and no distress Lochia: appropriate Uterine Fundus: firm Incision: none DVT Evaluation: No evidence of DVT seen on physical exam.   Basename 01/08/12 0515  HGB 11.2*  HCT 32.8*    Assessment/Plan: Plan for discharge tomorrow   LOS: 1 day   Bianca Cantrell A 01/08/2012, 6:40 AM

## 2012-01-09 MED ORDER — PRENATAL MULTIVITAMIN CH: 1.0000 | ORAL_TABLET | Freq: Every day | ORAL | Status: DC

## 2012-01-09 MED ORDER — OXYCODONE-ACETAMINOPHEN 5-325 MG PO TABS: 1.0000 | ORAL_TABLET | ORAL | Status: DC | PRN

## 2012-01-09 MED ORDER — IBUPROFEN 600 MG PO TABS: 600.0000 mg | ORAL_TABLET | Freq: Four times a day (QID) | ORAL | Status: DC

## 2012-01-09 NOTE — Progress Notes (Signed)
Post Partum Day 2 Subjective: no complaints  Objective: Blood pressure 130/82, pulse 94, temperature 98 F (36.7 C), temperature source Oral, resp. rate 20, last menstrual period 04/22/2010, SpO2 99.00%, unknown if currently breastfeeding.  Physical Exam:  General: alert and no distress Lochia: appropriate Uterine Fundus: firm Incision: none DVT Evaluation: No evidence of DVT seen on physical exam.   Basename 01/08/12 0515  HGB 11.2*  HCT 32.8*    Assessment/Plan: Discharge home   LOS: 2 days   HARPER,CHARLES A 01/09/2012, 5:45 AM

## 2012-01-09 NOTE — Discharge Summary (Signed)
Obstetric Discharge Summary Reason for Admission: onset of labor Prenatal Procedures: ultrasound Intrapartum Procedures: spontaneous vaginal delivery Postpartum Procedures: none Complications-Operative and Postpartum: none Hemoglobin  Date Value Range Status  01/08/2012 11.2* 12.0 - 15.0 g/dL Final     HCT  Date Value Range Status  01/08/2012 32.8* 36.0 - 46.0 % Final    Physical Exam:  General: alert and no distress Lochia: appropriate Uterine Fundus: firm Incision: none DVT Evaluation: No evidence of DVT seen on physical exam.  Discharge Diagnoses: Term Pregnancy-delivered  Discharge Information: Date: 01/09/2012 Activity: pelvic rest Diet: routine Medications: PNV, Ibuprofen, Colace and Percocet Condition: stable Instructions: refer to practice specific booklet Discharge to: home Follow-up Information    Follow up with Brock Bad, MD.   Contact information:   [address] [place] [address] [phone]          Newborn Data: Live born female  Birth Weight: 7 lb 15.5 oz (3615 g) APGAR: 9, 9  Home with mother.  Annalea Alguire A 01/09/2012, 5:53 AM

## 2012-01-15 ENCOUNTER — Telehealth (HOSPITAL_COMMUNITY): Payer: Self-pay | Admitting: *Deleted

## 2012-01-15 NOTE — Telephone Encounter (Signed)
Resolve episode 

## 2012-05-28 ENCOUNTER — Encounter: Payer: Self-pay | Admitting: *Deleted

## 2012-05-30 ENCOUNTER — Encounter: Payer: Self-pay | Admitting: Obstetrics

## 2012-05-30 ENCOUNTER — Ambulatory Visit (INDEPENDENT_AMBULATORY_CARE_PROVIDER_SITE_OTHER): Payer: Medicaid Other | Admitting: Obstetrics

## 2012-05-30 VITALS — BP 114/76 | HR 91 | Temp 98.3°F | Ht 59.0 in | Wt 227.6 lb

## 2012-05-30 DIAGNOSIS — Z3202 Encounter for pregnancy test, result negative: Secondary | ICD-10-CM

## 2012-05-30 DIAGNOSIS — E669 Obesity, unspecified: Secondary | ICD-10-CM

## 2012-05-30 DIAGNOSIS — Z30011 Encounter for initial prescription of contraceptive pills: Secondary | ICD-10-CM

## 2012-05-30 DIAGNOSIS — Z3009 Encounter for other general counseling and advice on contraception: Secondary | ICD-10-CM

## 2012-05-30 LAB — CBC
HCT: 37.5 % (ref 36.0–46.0)
Hemoglobin: 12.8 g/dL (ref 12.0–15.0)
RBC: 4.33 MIL/uL (ref 3.87–5.11)
WBC: 12 10*3/uL — ABNORMAL HIGH (ref 4.0–10.5)

## 2012-05-30 MED ORDER — PHENTERMINE HCL 37.5 MG PO CAPS: 37.5000 mg | ORAL_CAPSULE | ORAL | Status: DC

## 2012-05-30 NOTE — Patient Instructions (Addendum)
Diet and exercise.   

## 2012-05-30 NOTE — Progress Notes (Signed)
.   Subjective:    Bianca Cantrell is a 27 y.o. female who presents for contraception counseling. The patient has no complaints today. The patient is sexually active. Pertinent past medical history: none.  Last Pap smear 07/2011 - normal.  Menstrual History: OB History   Grav Para Term Preterm Abortions TAB SAB Ect Mult Living   4 4 4  0 0 0 0 0 0 4      Menarche age: 50  LMP: 05/23/2012    The following portions of the patient's history were reviewed and updated as appropriate: allergies, current medications, past family history, past medical history, past social history, past surgical history and problem list.  Review of Systems A comprehensive review of systems was negative.   Objective:    No exam performed today, Consult only..   Assessment:    27 y.o., considering  Nexplanon, no contraindications.   Plan:    All questions answered. Discussed healthy lifestyle modifications. Agricultural engineer distributed. Follow up in menses for Nexplanon insertion. period.Marland Kitchen

## 2012-05-31 LAB — COMPREHENSIVE METABOLIC PANEL
Albumin: 3.9 g/dL (ref 3.5–5.2)
BUN: 9 mg/dL (ref 6–23)
CO2: 24 mEq/L (ref 19–32)
Calcium: 9.1 mg/dL (ref 8.4–10.5)
Chloride: 104 mEq/L (ref 96–112)
Creat: 0.6 mg/dL (ref 0.50–1.10)
Glucose, Bld: 71 mg/dL (ref 70–99)

## 2012-05-31 LAB — TSH: TSH: 0.982 u[IU]/mL (ref 0.350–4.500)

## 2012-06-18 ENCOUNTER — Telehealth: Payer: Self-pay | Admitting: *Deleted

## 2012-06-18 NOTE — Telephone Encounter (Signed)
Patient called and left a message that she needs an appointment for a Nexplanon insertion.  LM ON VM TO CB.(4:48pm)

## 2012-06-28 ENCOUNTER — Encounter: Payer: Self-pay | Admitting: Obstetrics

## 2012-06-28 ENCOUNTER — Ambulatory Visit (INDEPENDENT_AMBULATORY_CARE_PROVIDER_SITE_OTHER): Payer: Medicaid Other | Admitting: Obstetrics

## 2012-06-28 VITALS — BP 130/94 | HR 83 | Temp 97.8°F | Ht <= 58 in | Wt 226.0 lb

## 2012-06-28 DIAGNOSIS — Z3202 Encounter for pregnancy test, result negative: Secondary | ICD-10-CM

## 2012-06-28 DIAGNOSIS — Z309 Encounter for contraceptive management, unspecified: Secondary | ICD-10-CM

## 2012-06-28 DIAGNOSIS — Z30017 Encounter for initial prescription of implantable subdermal contraceptive: Secondary | ICD-10-CM

## 2012-06-28 LAB — POCT URINE PREGNANCY: Preg Test, Ur: NEGATIVE

## 2012-06-28 NOTE — Progress Notes (Signed)
NEXPLANON INSERTION NOTE  Date of LMP:   06/16/2012   Contraception used: OCP  Pregnancy test result:  Negative 06/28/2012   Indications:  The patient desires contraception.  She understands risks, benefits, and alternatives to Implanon and would like to proceed.  Anesthesia:   Lidocaine 1% plain.  Procedure:  A time-out was performed confirming the procedure and the patient's allergy status.  The patient's non-dominant was identified as the left arm.  The protection cap was removed. While placing countertraction on the skin, the needle was inserted at a 30 degree angle.  The applicator was held horizontal to the skin; the skin was tented upward as the needle was introduced into the subdermal space.  While holding the applicator in place, the slider was unlocked. The Nexplanon was removed from the field.  The Nexplanon was palpated to ensure proper placement.  Complications: None  Instructions:  The patient was instructed to remove the dressing in 24 hours and that some bruising is to be expected.  She was advised to use over the counter analgesics as needed for any pain at the site.  She is to keep the area dry for 24 hours and to call if her hand or arm becomes cold, numb, or blue.  Return visit:  Return in 2 weeks

## 2012-06-28 NOTE — Patient Instructions (Signed)
Nexplanon

## 2012-07-02 NOTE — Telephone Encounter (Signed)
Patient in office 06/28/2012 for Nexplanon insertion.

## 2012-07-24 ENCOUNTER — Encounter: Payer: Self-pay | Admitting: Obstetrics

## 2012-07-24 ENCOUNTER — Ambulatory Visit (INDEPENDENT_AMBULATORY_CARE_PROVIDER_SITE_OTHER): Payer: Medicaid Other | Admitting: Obstetrics

## 2012-07-24 NOTE — Progress Notes (Signed)
Nexplanon

## 2012-08-01 ENCOUNTER — Emergency Department (HOSPITAL_COMMUNITY)
Admission: EM | Admit: 2012-08-01 | Discharge: 2012-08-01 | Disposition: A | Discharge status: Home / Self Care | Payer: Medicaid Other | Source: Home / Self Care | Attending: Family Medicine | Admitting: Family Medicine

## 2012-08-01 ENCOUNTER — Encounter (HOSPITAL_COMMUNITY): Payer: Self-pay | Admitting: Emergency Medicine

## 2012-08-01 DIAGNOSIS — K5289 Other specified noninfective gastroenteritis and colitis: Secondary | ICD-10-CM

## 2012-08-01 DIAGNOSIS — K529 Noninfective gastroenteritis and colitis, unspecified: Secondary | ICD-10-CM

## 2012-08-01 LAB — POCT PREGNANCY, URINE: Preg Test, Ur: NEGATIVE

## 2012-08-01 LAB — POCT URINALYSIS DIP (DEVICE)
Hgb urine dipstick: NEGATIVE
Protein, ur: NEGATIVE mg/dL
Specific Gravity, Urine: 1.02 (ref 1.005–1.030)
Urobilinogen, UA: 0.2 mg/dL (ref 0.0–1.0)

## 2012-08-01 MED ORDER — ONDANSETRON 4 MG PO TBDP: 4.0000 mg | ORAL_TABLET | Freq: Three times a day (TID) | ORAL | Status: DC | PRN

## 2012-08-01 MED ORDER — LOPERAMIDE HCL 2 MG PO CAPS
2.0000 mg | ORAL_CAPSULE | Freq: Four times a day (QID) | ORAL | Status: DC | PRN
Start: 2012-08-01 — End: 2012-11-19

## 2012-08-01 NOTE — ED Provider Notes (Signed)
History     CSN: [id number]  Arrival date & time 08/01/12  1015   First MD Initiated Contact with Patient 08/01/12 1038      Chief Complaint  Patient presents with  . Emesis    (Consider location/radiation/quality/duration/timing/severity/associated sxs/prior treatment) HPI Comments: 27 year old female 6 months postpartum. Here complaining of nausea vomiting and diarrhea since 2 AM today. Patient stated her husband had similar symptoms yesterday. She has had about 4 episodes of non-bloody emesis and more than 5 episodes of large liquid diarrhea with no blood or mucus. Last episode of vomiting was about 3 hours ago. States her nausea is improving but still having frequent diarrheas. Denies constant abdominal pain and reports cramps as before bowel movements. No fever or chills. No dysuria. No headache or dizziness. She has been drinking Gatorade. Has not taken other medications for her symptoms.   Past Medical History  Diagnosis Date  . No pertinent past medical history   . Gallstones   . Chlamydia   . Anemia   . Sickle cell trait   . Infection     urinary tract infection  . Eczema   . Abnormal Pap smear     HPV    Past Surgical History  Procedure Laterality Date  . Cholecystectomy  2011  . Appendectomy  2010    Family History  Problem Relation Age of Onset  . Hypertension Mother   . Heart disease Mother   . Hypertension Maternal Grandmother   . Heart disease Maternal Grandmother   . Other Neg Hx     History  Substance Use Topics  . Smoking status: Former Smoker -- 0.25 packs/day    Types: Cigarettes  . Smokeless tobacco: Never Used  . Alcohol Use: No    OB History   Grav Para Term Preterm Abortions TAB SAB Ect Mult Living   4 4 4  0 0 0 0 0 0 4      Review of Systems  Constitutional: Positive for appetite change. Negative for fever, chills, diaphoresis and fatigue.  HENT: Negative for congestion, sore throat and neck stiffness.   Respiratory: Negative  for chest tightness and shortness of breath.   Gastrointestinal: Positive for nausea, vomiting and diarrhea. Negative for abdominal pain, blood in stool and abdominal distention.  Genitourinary: Negative for dysuria, frequency, hematuria, flank pain, vaginal bleeding, vaginal discharge and pelvic pain.  Musculoskeletal: Positive for back pain.  Skin: Negative for rash.  Neurological: Negative for dizziness and headaches.    Allergies  Sulfa antibiotics; Sulfur; and Other  Home Medications   Current Outpatient Rx  Name  Route  Sig  Dispense  Refill  . ibuprofen (ADVIL,MOTRIN) 600 MG tablet   Oral   Take 1 tablet (600 mg total) by mouth every 6 (six) hours.   30 tablet   5   . loperamide (IMODIUM) 2 MG capsule   Oral   Take 1 capsule (2 mg total) by mouth 4 (four) times daily as needed for diarrhea or loose stools.   12 capsule   0   . ondansetron (ZOFRAN-ODT) 4 MG disintegrating tablet   Oral   Take 1 tablet (4 mg total) by mouth every 8 (eight) hours as needed for nausea.   10 tablet   0   . phentermine 37.5 MG capsule   Oral   Take 1 capsule (37.5 mg total) by mouth every morning.   30 capsule   2   . Prenatal Vit-Fe Fumarate-FA (PRENATAL MULTIVITAMIN) TABS  Oral   Take 1 tablet by mouth daily.   30 tablet   11     BP 120/75  Pulse 89  Temp(Src) 97.9 F (36.6 C) (Oral)  Resp 16  SpO2 96%  LMP 07/29/2012  Breastfeeding? No  Physical Exam  Nursing note and vitals reviewed. Constitutional: She is oriented to person, place, and time. She appears well-developed and well-nourished. No distress.  HENT:  Mouth/Throat: Oropharynx is clear and moist. No oropharyngeal exudate.  Eyes: No scleral icterus.  Neck: No JVD present.  Cardiovascular: Normal heart sounds.   Pulmonary/Chest: Effort normal and breath sounds normal.  Abdominal: Soft. Bowel sounds are normal. She exhibits no distension and no mass. There is no tenderness. There is no rebound and no  guarding.  No CVT  Lymphadenopathy:    She has no cervical adenopathy.  Neurological: She is alert and oriented to person, place, and time.  Skin: No rash noted. She is not diaphoretic.    ED Course  Procedures (including critical care time)  Labs Reviewed  POCT URINALYSIS DIP (DEVICE)  POCT PREGNANCY, URINE   No results found.   1. Gastroenteritis       MDM  No clinical signs of dehydration. Normal urinalysis with no ketones. Prescribe loperamide and ondansetron. Supportive care and red flags that should prompt her return to medical attention discussed with patient and provided in writing.       Sharin Grave, MD 08/03/12 1610

## 2012-08-01 NOTE — ED Notes (Addendum)
Reports vomiting, side pain, onset last night.  Patient also reports diarrhea.  Left flank pain.  Denies pain with urination, reports urine is strong.  " drained and no energy"

## 2012-09-13 ENCOUNTER — Emergency Department (HOSPITAL_COMMUNITY)
Admission: EM | Admit: 2012-09-13 | Discharge: 2012-09-13 | Disposition: A | Discharge status: Home / Self Care | Payer: Medicaid Other | Source: Home / Self Care | Attending: Family Medicine | Admitting: Family Medicine

## 2012-09-13 ENCOUNTER — Encounter (HOSPITAL_COMMUNITY): Payer: Self-pay | Admitting: Emergency Medicine

## 2012-09-13 DIAGNOSIS — K5289 Other specified noninfective gastroenteritis and colitis: Secondary | ICD-10-CM

## 2012-09-13 DIAGNOSIS — N390 Urinary tract infection, site not specified: Secondary | ICD-10-CM

## 2012-09-13 LAB — POCT URINALYSIS DIP (DEVICE)
Protein, ur: NEGATIVE mg/dL
Specific Gravity, Urine: 1.02 (ref 1.005–1.030)
Urobilinogen, UA: 2 mg/dL — ABNORMAL HIGH (ref 0.0–1.0)

## 2012-09-13 MED ORDER — CEPHALEXIN 500 MG PO CAPS: 500.0000 mg | ORAL_CAPSULE | Freq: Four times a day (QID) | ORAL | Status: DC

## 2012-09-13 NOTE — ED Provider Notes (Signed)
History    CSN: [id number] Arrival date & time 09/13/12  1341  First MD Initiated Contact with Patient 09/13/12 1406     Chief Complaint  Patient presents with  . Urinary Tract Infection   (Consider location/radiation/quality/duration/timing/severity/associated sxs/prior Treatment) Patient is a 27 y.o. female presenting with urinary tract infection. The history is provided by the patient.  Urinary Tract Infection This is a new problem. The current episode started more than 1 week ago (2-3 weeks of sx, self treated by googling home remedies.). The problem has not changed since onset.Pertinent negatives include no abdominal pain.   Past Medical History  Diagnosis Date  . No pertinent past medical history   . Gallstones   . Chlamydia   . Anemia   . Sickle cell trait   . Infection     urinary tract infection  . Eczema   . Abnormal Pap smear     HPV   Past Surgical History  Procedure Laterality Date  . Cholecystectomy  2011  . Appendectomy  2010   Family History  Problem Relation Age of Onset  . Hypertension Mother   . Heart disease Mother   . Hypertension Maternal Grandmother   . Heart disease Maternal Grandmother   . Other Neg Hx    History  Substance Use Topics  . Smoking status: Former Smoker -- 0.25 packs/day    Types: Cigarettes  . Smokeless tobacco: Never Used  . Alcohol Use: No   OB History   Grav Para Term Preterm Abortions TAB SAB Ect Mult Living   4 4 4  0 0 0 0 0 0 4     Review of Systems  Constitutional: Negative.   Gastrointestinal: Negative for abdominal pain.  Genitourinary: Positive for dysuria, urgency and frequency. Negative for vaginal bleeding, vaginal discharge and pelvic pain.    Allergies  Sulfa antibiotics; Sulfur; and Other  Home Medications   Current Outpatient Rx  Name  Route  Sig  Dispense  Refill  . cephALEXin (KEFLEX) 500 MG capsule   Oral   Take 1 capsule (500 mg total) by mouth 4 (four) times daily. Take all of medicine  and drink lots of fluids   20 capsule   0   . ibuprofen (ADVIL,MOTRIN) 600 MG tablet   Oral   Take 1 tablet (600 mg total) by mouth every 6 (six) hours.   30 tablet   5   . loperamide (IMODIUM) 2 MG capsule   Oral   Take 1 capsule (2 mg total) by mouth 4 (four) times daily as needed for diarrhea or loose stools.   12 capsule   0   . ondansetron (ZOFRAN-ODT) 4 MG disintegrating tablet   Oral   Take 1 tablet (4 mg total) by mouth every 8 (eight) hours as needed for nausea.   10 tablet   0   . phentermine 37.5 MG capsule   Oral   Take 1 capsule (37.5 mg total) by mouth every morning.   30 capsule   2   . Prenatal Vit-Fe Fumarate-FA (PRENATAL MULTIVITAMIN) TABS   Oral   Take 1 tablet by mouth daily.   30 tablet   11    BP 133/91  Pulse 90  Temp(Src) 99 F (37.2 C) (Oral)  Resp 20  SpO2 98%  Breastfeeding? No Physical Exam  Nursing note and vitals reviewed. Constitutional: She is oriented to person, place, and time. She appears well-developed and well-nourished.  Abdominal: Soft. Bowel sounds are normal. She  exhibits no distension and no mass. There is no tenderness. There is no rebound and no guarding.  Neurological: She is alert and oriented to person, place, and time.  Skin: Skin is warm and dry.    ED Course  Procedures (including critical care time) Labs Reviewed  POCT URINALYSIS DIP (DEVICE) - Abnormal; Notable for the following:    Hgb urine dipstick TRACE (*)    Urobilinogen, UA 2.0 (*)    Leukocytes, UA SMALL (*)    All other components within normal limits  POCT PREGNANCY, URINE   No results found. 1. UTI (lower urinary tract infection)     MDM    Linna Hoff, MD 09/13/12 1453

## 2012-09-13 NOTE — ED Notes (Signed)
Pt c/o UTI sxs onset 2 weeks... sxs include: back pain, dysuria, urinary freq/urgency, nauseas... Denies: fevers, vag d/c... She is alert w/no signs of acute distress.

## 2012-11-19 ENCOUNTER — Encounter: Payer: Self-pay | Admitting: Obstetrics

## 2012-11-19 ENCOUNTER — Ambulatory Visit (INDEPENDENT_AMBULATORY_CARE_PROVIDER_SITE_OTHER): Payer: Medicaid Other | Admitting: Obstetrics

## 2012-11-19 ENCOUNTER — Ambulatory Visit: Payer: Medicaid Other | Admitting: Obstetrics

## 2012-11-19 VITALS — BP 96/56 | HR 103 | Temp 98.2°F | Ht 59.0 in | Wt 222.0 lb

## 2012-11-19 DIAGNOSIS — A630 Anogenital (venereal) warts: Secondary | ICD-10-CM | POA: Insufficient documentation

## 2012-11-19 NOTE — Progress Notes (Signed)
Subjective: Interval History: has complaints rectal bump unresolved since pregnancy for the past year. She denies pain, states there is no drainage.  Afebrile, VSS  PE:       NEFG.      Anus:  Few small hemorrhoidal tags.  No condyloma observed.  AP:  Normal exam.         F/U for Annual and Pap.

## 2012-12-24 ENCOUNTER — Ambulatory Visit: Payer: Medicaid Other | Admitting: Obstetrics

## 2013-01-12 ENCOUNTER — Emergency Department (HOSPITAL_COMMUNITY): Payer: Medicaid Other

## 2013-01-12 ENCOUNTER — Emergency Department (HOSPITAL_COMMUNITY)
Admission: EM | Admit: 2013-01-12 | Discharge: 2013-01-12 | Disposition: A | Discharge status: Home / Self Care | Payer: Medicaid Other | Attending: Emergency Medicine | Admitting: Emergency Medicine

## 2013-01-12 ENCOUNTER — Encounter (HOSPITAL_COMMUNITY): Payer: Self-pay | Admitting: Emergency Medicine

## 2013-01-12 DIAGNOSIS — F17200 Nicotine dependence, unspecified, uncomplicated: Secondary | ICD-10-CM | POA: Insufficient documentation

## 2013-01-12 DIAGNOSIS — R0602 Shortness of breath: Secondary | ICD-10-CM | POA: Insufficient documentation

## 2013-01-12 DIAGNOSIS — Z872 Personal history of diseases of the skin and subcutaneous tissue: Secondary | ICD-10-CM | POA: Insufficient documentation

## 2013-01-12 DIAGNOSIS — R05 Cough: Secondary | ICD-10-CM | POA: Insufficient documentation

## 2013-01-12 DIAGNOSIS — R079 Chest pain, unspecified: Secondary | ICD-10-CM | POA: Insufficient documentation

## 2013-01-12 DIAGNOSIS — Z87440 Personal history of urinary (tract) infections: Secondary | ICD-10-CM | POA: Insufficient documentation

## 2013-01-12 DIAGNOSIS — Z862 Personal history of diseases of the blood and blood-forming organs and certain disorders involving the immune mechanism: Secondary | ICD-10-CM | POA: Insufficient documentation

## 2013-01-12 DIAGNOSIS — Z3202 Encounter for pregnancy test, result negative: Secondary | ICD-10-CM | POA: Insufficient documentation

## 2013-01-12 DIAGNOSIS — Z8619 Personal history of other infectious and parasitic diseases: Secondary | ICD-10-CM | POA: Insufficient documentation

## 2013-01-12 DIAGNOSIS — R059 Cough, unspecified: Secondary | ICD-10-CM | POA: Insufficient documentation

## 2013-01-12 DIAGNOSIS — R209 Unspecified disturbances of skin sensation: Secondary | ICD-10-CM | POA: Insufficient documentation

## 2013-01-12 DIAGNOSIS — Z8719 Personal history of other diseases of the digestive system: Secondary | ICD-10-CM | POA: Insufficient documentation

## 2013-01-12 LAB — CBC WITH DIFFERENTIAL/PLATELET
Basophils Absolute: 0 10*3/uL (ref 0.0–0.1)
Basophils Relative: 0 % (ref 0–1)
Eosinophils Relative: 2 % (ref 0–5)
HCT: 40 % (ref 36.0–46.0)
MCHC: 35 g/dL (ref 30.0–36.0)
MCV: 86.4 fL (ref 78.0–100.0)
Monocytes Absolute: 0.6 10*3/uL (ref 0.1–1.0)
RDW: 12.9 % (ref 11.5–15.5)

## 2013-01-12 LAB — BASIC METABOLIC PANEL
CO2: 24 mEq/L (ref 19–32)
Calcium: 9.6 mg/dL (ref 8.4–10.5)
Creatinine, Ser: 0.72 mg/dL (ref 0.50–1.10)
GFR calc Af Amer: >90 mL/min (ref >90)

## 2013-01-12 LAB — D-DIMER, QUANTITATIVE: D-Dimer, Quant: 0.56 ug/mL-FEU — ABNORMAL HIGH (ref 0.00–0.48)

## 2013-01-12 MED ORDER — IBUPROFEN 800 MG PO TABS
800.0000 mg | ORAL_TABLET | Freq: Once | ORAL | Status: AC
  Administered 2013-01-12: 800 mg via ORAL
  Filled 2013-01-12: qty 1

## 2013-01-12 MED ORDER — IOHEXOL 350 MG/ML SOLN
100.0000 mL | Freq: Once | INTRAVENOUS | Status: AC | PRN
  Administered 2013-01-12: 100 mL via INTRAVENOUS

## 2013-01-12 MED ORDER — SODIUM CHLORIDE 0.9 % IV BOLUS (SEPSIS)
1000.0000 mL | Freq: Once | INTRAVENOUS | Status: AC
  Administered 2013-01-12: 1000 mL via INTRAVENOUS

## 2013-01-12 NOTE — ED Provider Notes (Signed)
CSN: [id number]     Arrival date & time 01/12/13  1638 History   First MD Initiated Contact with Patient 01/12/13 1640     Chief Complaint  Patient presents with  . Chest Pain  . Shortness of Breath   (Consider location/radiation/quality/duration/timing/severity/associated sxs/prior Treatment) HPI Comments: 27 year old female presents with one week of intermittent chest pain. She states the pain is right middle of her chest and is a heavy in quality. The pain comes and goes multiple times a day and lasts for a few minutes. There is nothing seems to help the pain except raising both of her arms above her head. She seems to maybe have some shortness of breath with it. She has had a cough during the same week. No fevers or chills. She's also been feeling tingling in her bilateral hands and has had some cramping that radiates from her hands up to her arms. She has not noticed any arm swelling. Not noticing focal arm pain. This cramping seems to come and go. Does not know single leg swelling or leg pain. She had a friend was on the same implantable contraceptive who had a blood clot and so she is worried about the same. The pain is currently a 4/10 in her chest. Her arms are not hurting at this time. Denies any pleuritic pain   Past Medical History  Diagnosis Date  . No pertinent past medical history   . Gallstones   . Chlamydia   . Anemia   . Sickle cell trait   . Infection     urinary tract infection  . Eczema   . Abnormal Pap smear     HPV   Past Surgical History  Procedure Laterality Date  . Cholecystectomy  2011  . Appendectomy  2010   Family History  Problem Relation Age of Onset  . Hypertension Mother   . Heart disease Mother   . Hypertension Maternal Grandmother   . Heart disease Maternal Grandmother   . Other Neg Hx    History  Substance Use Topics  . Smoking status: Current Every Day Smoker -- 0.25 packs/day    Types: Cigarettes  . Smokeless tobacco: Never Used  .  Alcohol Use: Yes   OB History   Grav Para Term Preterm Abortions TAB SAB Ect Mult Living   4 4 4  0 0 0 0 0 0 4     Review of Systems  Constitutional: Negative for fever and chills.  Respiratory: Positive for cough and shortness of breath.   Cardiovascular: Positive for chest pain. Negative for leg swelling.  Gastrointestinal: Negative for nausea, vomiting, abdominal pain and diarrhea.  Musculoskeletal: Negative for back pain and joint swelling.  All other systems reviewed and are negative.    Allergies  Sulfa antibiotics; Sulfur; and Other  Home Medications   Current Outpatient Rx  Name  Route  Sig  Dispense  Refill  . phentermine 37.5 MG capsule   Oral   Take 1 capsule (37.5 mg total) by mouth every morning.   30 capsule   2    BP 120/73  Pulse 70  Temp(Src) 97.3 F (36.3 C) (Oral)  Resp 20  SpO2 100%  LMP 12/29/2012 Physical Exam  Nursing note and vitals reviewed. Constitutional: She is oriented to person, place, and time. She appears well-developed and well-nourished.  HENT:  Head: Normocephalic and atraumatic.  Right Ear: External ear normal.  Left Ear: External ear normal.  Nose: Nose normal.  Eyes: Right eye exhibits no  discharge. Left eye exhibits no discharge.  Cardiovascular: Normal rate, regular rhythm and normal heart sounds.   Pulses:      Radial pulses are 2+ on the right side, and 2+ on the left side.  Pulmonary/Chest: Effort normal and breath sounds normal. She has no wheezes. She has no rales. She exhibits no tenderness.  Abdominal: Soft. There is no tenderness.  Musculoskeletal:  Normal upper extremity exam bilaterally. She has no swelling, tenderness, or motor or sensory deficits. She does have a small area under her left upper arm consistent with her implantable contraceptive.  Neurological: She is alert and oriented to person, place, and time.  Skin: Skin is warm and dry.    ED Course  Procedures (including critical care time) Labs  Review Labs Reviewed  CBC WITH DIFFERENTIAL - Abnormal; Notable for the following:    WBC 11.5 (*)    Lymphs Abs 4.2 (*)    All other components within normal limits  BASIC METABOLIC PANEL - Abnormal; Notable for the following:    Potassium 3.4 (*)    All other components within normal limits  D-DIMER, QUANTITATIVE - Abnormal; Notable for the following:    D-Dimer, Quant 0.56 (*)    All other components within normal limits  POCT PREGNANCY, URINE  POCT I-STAT TROPONIN I   Imaging Review Dg Chest 2 View  01/12/2013   CLINICAL DATA:  Chest pain 1 week with shortness of breath  EXAM: CHEST  2 VIEW  COMPARISON:  11/27/2008  FINDINGS: The heart size and mediastinal contours are within normal limits. Both lungs are clear. The visualized skeletal structures are unremarkable.  IMPRESSION: No active cardiopulmonary disease.   Electronically Signed   By: Esperanza Heir M.D.   On: 01/12/2013 17:43   Ct Angio Chest Pe W/cm &/or Wo Cm  01/12/2013   CLINICAL DATA:  Chest tightness and shortness of breath  EXAM: CT ANGIOGRAPHY CHEST WITH CONTRAST  TECHNIQUE: Multidetector CT imaging of the chest was performed using the standard protocol during bolus administration of intravenous contrast. Multiplanar CT image reconstructions including MIPs were obtained to evaluate the vascular anatomy.  CONTRAST:  OMNIPAQUE IOHEXOL 350 MG/ML SOLN  COMPARISON:  Chest radiograph performed today  FINDINGS: There are no filling defects in the pulmonary arterial system. The thoracic aorta shows no dissection or dilatation. Lungs are clear. No pleural effusions. No adenopathy. No acute musculoskeletal abnormalities.  Review of the MIP images confirms the above findings.  IMPRESSION: Negative study   Electronically Signed   By: Esperanza Heir M.D.   On: 01/12/2013 18:39    EKG Interpretation     Ventricular Rate:  69 PR Interval:  159 QRS Duration: 86 QT Interval:  390 QTC Calculation: 418 R Axis:   32 Text  Interpretation:  Normal sinus rhythm EKG WITHIN NORMAL LIMITS No old tracing to compare            MDM   1. Chest pain    Patient is low risk for ACS, has a normal EKG and normal troponin symptoms going on for over a week. Patient also has no apparent DVT or PE exam and CT study. Given that she appears well,has symptoms with ongoing URI symptoms, I feel that her chest pain is of benign etiology. We'll treat symptomatically with NSAIDs and recommend PCP followup.    Audree Camel, MD 01/12/13 [phone]

## 2013-01-12 NOTE — ED Notes (Addendum)
Pt reports centralized, intermittent chest pain x1 week. Pain and dyspnea with exertion progressively worse today. Pain starts to center of chest, radiates to bilat arms. Arms aching and tingling. Pt has birth control implant in left arm, current every day smoker. Pt states she thinks she has blood clots in her arms. Has taken aspirin at home

## 2013-01-12 NOTE — ED Notes (Signed)
Pt states she doesn't have regular menstrual cycles d/t implant, LMP was 12/29/12, states was "nothing but clots"

## 2013-01-15 ENCOUNTER — Encounter (HOSPITAL_COMMUNITY): Payer: Self-pay | Admitting: Emergency Medicine

## 2013-01-15 ENCOUNTER — Emergency Department (HOSPITAL_COMMUNITY)
Admission: EM | Admit: 2013-01-15 | Discharge: 2013-01-15 | Disposition: A | Discharge status: Home / Self Care | Payer: Medicaid Other | Attending: Emergency Medicine | Admitting: Emergency Medicine

## 2013-01-15 DIAGNOSIS — Z7982 Long term (current) use of aspirin: Secondary | ICD-10-CM | POA: Insufficient documentation

## 2013-01-15 DIAGNOSIS — M79609 Pain in unspecified limb: Secondary | ICD-10-CM | POA: Insufficient documentation

## 2013-01-15 DIAGNOSIS — Z8719 Personal history of other diseases of the digestive system: Secondary | ICD-10-CM | POA: Insufficient documentation

## 2013-01-15 DIAGNOSIS — Z8619 Personal history of other infectious and parasitic diseases: Secondary | ICD-10-CM | POA: Insufficient documentation

## 2013-01-15 DIAGNOSIS — Z862 Personal history of diseases of the blood and blood-forming organs and certain disorders involving the immune mechanism: Secondary | ICD-10-CM | POA: Insufficient documentation

## 2013-01-15 DIAGNOSIS — Z711 Person with feared health complaint in whom no diagnosis is made: Secondary | ICD-10-CM

## 2013-01-15 DIAGNOSIS — F17200 Nicotine dependence, unspecified, uncomplicated: Secondary | ICD-10-CM | POA: Insufficient documentation

## 2013-01-15 DIAGNOSIS — Z872 Personal history of diseases of the skin and subcutaneous tissue: Secondary | ICD-10-CM | POA: Insufficient documentation

## 2013-01-15 DIAGNOSIS — R252 Cramp and spasm: Secondary | ICD-10-CM

## 2013-01-15 NOTE — ED Notes (Signed)
Patient is alert and oriented x3.  She is complaining of leg cramping and pain that started approximately 12am while she was at work. She states that she fells more pain when she is walking.  Currently she rates her pain 5 of 10.  She was seen at [facility] 3 days ago for same issue

## 2013-01-15 NOTE — ED Provider Notes (Signed)
Medical screening examination/treatment/procedure(s) were performed by non-physician practitioner and as supervising physician I was immediately available for consultation/collaboration.  EKG Interpretation   None        Pria Klosinski K Havoc Sanluis-Rasch, MD 01/15/13 [id number]

## 2013-01-15 NOTE — ED Provider Notes (Signed)
CSN: [id number]     Arrival date & time 01/15/13  7829 History   First MD Initiated Contact with Patient 01/15/13 [phone]     Chief Complaint  Patient presents with  . Leg Pain    cramping   HPI  History provided by the patient. Patient is a 27 year old female who presents with complaints of bilateral arm and leg pain and cramping. Patient states she was getting off of work and suddenly felt a sharp pain moved to her bilateral upper arms and then into her lower legs with tightening around the calves. Symptoms lasted several minutes. Patient reports trying to move her feet to help with pains in her legs. She is unsure if this helped the symptoms did improve on the way to the emergency department. Currently she denies any significant pain or discomfort. Patient states that she does have a Nexplanon implant for birth control and does smoke cigarettes. She states that she is somewhat concerned for a blood clot and also states that she often feels that she has poor circulation in both of her arms. Patient was seen 2 nights ago in the emergency department with concerns of chest pain and shortness of breath. She had a D-dimer test which was elevated and a subsequent CT angiogram of chest which did not show any PE. Patient states that ever since she was told her D-dimer test was elevated she is worried about blood clots within her body. She denies any swelling of the extremities. She denies any skin changes. Denies any chest pain shortness of breath today. There. No other aggravating or alleviating factors. No other associated symptoms.    Past Medical History  Diagnosis Date  . No pertinent past medical history   . Gallstones   . Chlamydia   . Anemia   . Sickle cell trait   . Infection     urinary tract infection  . Eczema   . Abnormal Pap smear     HPV   Past Surgical History  Procedure Laterality Date  . Cholecystectomy  2011  . Appendectomy  2010   Family History  Problem Relation Age of  Onset  . Hypertension Mother   . Heart disease Mother   . Hypertension Maternal Grandmother   . Heart disease Maternal Grandmother   . Other Neg Hx    History  Substance Use Topics  . Smoking status: Current Every Day Smoker -- 0.25 packs/day    Types: Cigarettes  . Smokeless tobacco: Never Used  . Alcohol Use: Yes   OB History   Grav Para Term Preterm Abortions TAB SAB Ect Mult Living   4 4 4  0 0 0 0 0 0 4     Review of Systems  Constitutional: Negative for fever, chills and diaphoresis.  Respiratory: Negative for shortness of breath.   Cardiovascular: Negative for chest pain and leg swelling.  Musculoskeletal: Negative for back pain and neck pain.  Neurological: Negative for weakness and numbness.  All other systems reviewed and are negative.    Allergies  Sulfa antibiotics; Sulfur; and Other  Home Medications   Current Outpatient Rx  Name  Route  Sig  Dispense  Refill  . aspirin EC 81 MG tablet   Oral   Take 81 mg by mouth daily.         Marland Kitchen etonogestrel (NEXPLANON) 68 MG IMPL implant   Subcutaneous   Inject 1 each into the skin continuous. 2014          BP  141/86  Pulse 104  Temp(Src) 98 F (36.7 C) (Oral)  SpO2 100%  LMP 12/29/2012 Physical Exam  Nursing note and vitals reviewed. Constitutional: She is oriented to person, place, and time. She appears well-developed and well-nourished. No distress.  HENT:  Head: Normocephalic and atraumatic.  No battle sign or raccoon eyes  Eyes: Conjunctivae and EOM are normal. Pupils are equal, round, and reactive to light.  Neck: Normal range of motion. Neck supple.  No cervical midline tenderness.  NEXUS criteria are met.  Cardiovascular: Normal rate, regular rhythm and intact distal pulses.   No murmur heard. Pulmonary/Chest: Effort normal and breath sounds normal. No respiratory distress. She has no wheezes. She has no rales.  Abdominal: Soft. She exhibits no distension. There is no tenderness. There is no  rigidity, no rebound, no guarding, no CVA tenderness and no tenderness at McBurney's point.  Musculoskeletal: Normal range of motion. She exhibits no edema and no tenderness.  No pain or swelling of extremities. She has normal distal pulses in all extremities. Normal capillary refill throughout. Normal temperature. No clinical signs concerning for DVT.  Neurological: She is alert and oriented to person, place, and time. She has normal strength. No cranial nerve deficit or sensory deficit. Gait normal.  Skin: Skin is warm and dry. No rash noted.  Psychiatric: She has a normal mood and affect. Her behavior is normal.    ED Course  Procedures   DIAGNOSTIC STUDIES: Oxygen Saturation is 100% on room air.    COORDINATION OF CARE:  Nursing notes reviewed. Vital signs reviewed. Initial pt interview and examination performed.   4:51 AM-patient seen and evaluated. Patient appears well in no acute distress. Patient with episodic pain throughout her upper and lower arms bilaterally. There is no swelling. Although she had an elevated d-dimer 2 days ago she has no clinical signs for DVT. CT angiogram ruled out PE. Patient is currently asymptomatic. I spent a significant amount of time explaining symptoms of a DVT. At this time patient is felt able to return home and followup with her doctors. She agrees with plan.       MDM   1. Muscle cramp   2. Physically well but worried        Angus Seller, PA-C 01/15/13 [phone]

## 2013-02-04 ENCOUNTER — Emergency Department (HOSPITAL_COMMUNITY)
Admission: EM | Admit: 2013-02-04 | Discharge: 2013-02-04 | Disposition: A | Discharge status: Home / Self Care | Payer: Medicaid Other | Attending: Emergency Medicine | Admitting: Emergency Medicine

## 2013-02-04 ENCOUNTER — Encounter (HOSPITAL_COMMUNITY): Payer: Self-pay | Admitting: Emergency Medicine

## 2013-02-04 DIAGNOSIS — K219 Gastro-esophageal reflux disease without esophagitis: Secondary | ICD-10-CM | POA: Insufficient documentation

## 2013-02-04 DIAGNOSIS — Z79899 Other long term (current) drug therapy: Secondary | ICD-10-CM | POA: Insufficient documentation

## 2013-02-04 DIAGNOSIS — Z7982 Long term (current) use of aspirin: Secondary | ICD-10-CM | POA: Insufficient documentation

## 2013-02-04 DIAGNOSIS — E669 Obesity, unspecified: Secondary | ICD-10-CM | POA: Insufficient documentation

## 2013-02-04 DIAGNOSIS — Z862 Personal history of diseases of the blood and blood-forming organs and certain disorders involving the immune mechanism: Secondary | ICD-10-CM | POA: Insufficient documentation

## 2013-02-04 DIAGNOSIS — F17200 Nicotine dependence, unspecified, uncomplicated: Secondary | ICD-10-CM | POA: Insufficient documentation

## 2013-02-04 DIAGNOSIS — Z872 Personal history of diseases of the skin and subcutaneous tissue: Secondary | ICD-10-CM | POA: Insufficient documentation

## 2013-02-04 DIAGNOSIS — R110 Nausea: Secondary | ICD-10-CM | POA: Insufficient documentation

## 2013-02-04 DIAGNOSIS — Z8619 Personal history of other infectious and parasitic diseases: Secondary | ICD-10-CM | POA: Insufficient documentation

## 2013-02-04 DIAGNOSIS — Z87440 Personal history of urinary (tract) infections: Secondary | ICD-10-CM | POA: Insufficient documentation

## 2013-02-04 DIAGNOSIS — R197 Diarrhea, unspecified: Secondary | ICD-10-CM | POA: Insufficient documentation

## 2013-02-04 MED ORDER — OMEPRAZOLE 20 MG PO CPDR: 20.0000 mg | DELAYED_RELEASE_CAPSULE | Freq: Two times a day (BID) | ORAL | Status: DC

## 2013-02-04 MED ORDER — DIPHENOXYLATE-ATROPINE 2.5-0.025 MG PO TABS
2.0000 | ORAL_TABLET | Freq: Once | ORAL | Status: AC
  Administered 2013-02-04: 2 via ORAL
  Filled 2013-02-04: qty 2

## 2013-02-04 MED ORDER — GI COCKTAIL ~~LOC~~
30.0000 mL | Freq: Once | ORAL | Status: AC
  Administered 2013-02-04: 30 mL via ORAL
  Filled 2013-02-04: qty 30

## 2013-02-04 NOTE — ED Provider Notes (Signed)
CSN: [id number]     Arrival date & time 02/04/13  2012 History   First MD Initiated Contact with Patient 02/04/13 2013     Chief Complaint  Patient presents with  . Chest Pain     HPI  Patient presents with complain of some diarrhea today. Her main complaints of chest pain her chest. She works nights  Perform this morning she started feeling some discomfort at the base of her neck and upper chest and a sour taste in her mouth with some nausea. She laid down her symptoms got worse. She took an Alka-Seltzer he got better. Has been present most of the day procedures episodes of diarrhea today as well. No fevers. No shortness of breath. No exertional pain or symptoms. Evaluation recently with normal EKG and CT angiogram. She is a smoker. Occasional alcohol. Last week. In frequent anti-inflammatories. Takes 5 or 6 glasses of tea. No additional caffeine intake.  Past Medical History  Diagnosis Date  . No pertinent past medical history   . Gallstones   . Chlamydia   . Anemia   . Sickle cell trait   . Infection     urinary tract infection  . Eczema   . Abnormal Pap smear     HPV   Past Surgical History  Procedure Laterality Date  . Cholecystectomy  2011  . Appendectomy  2010   Family History  Problem Relation Age of Onset  . Hypertension Mother   . Heart disease Mother   . Hypertension Maternal Grandmother   . Heart disease Maternal Grandmother   . Other Neg Hx    History  Substance Use Topics  . Smoking status: Current Every Day Smoker -- 0.25 packs/day    Types: Cigarettes  . Smokeless tobacco: Never Used  . Alcohol Use: Yes   OB History   Grav Para Term Preterm Abortions TAB SAB Ect Mult Living   4 4 4  0 0 0 0 0 0 4     Review of Systems  Constitutional: Negative for fever, chills, diaphoresis, appetite change and fatigue.  HENT: Negative for mouth sores, sore throat and trouble swallowing.   Eyes: Negative for visual disturbance.  Respiratory: Negative for cough,  chest tightness, shortness of breath and wheezing.   Cardiovascular: Positive for chest pain.  Gastrointestinal: Positive for nausea and diarrhea. Negative for vomiting, abdominal pain and abdominal distention.  Endocrine: Negative for polydipsia, polyphagia and polyuria.  Genitourinary: Negative for dysuria, frequency and hematuria.  Musculoskeletal: Negative for gait problem.  Skin: Negative for color change, pallor and rash.  Neurological: Negative for dizziness, syncope, light-headedness and headaches.  Hematological: Does not bruise/bleed easily.  Psychiatric/Behavioral: Negative for behavioral problems and confusion.    Allergies  Sulfa antibiotics; Sulfur; and Other  Home Medications   Current Outpatient Rx  Name  Route  Sig  Dispense  Refill  . aspirin EC 81 MG tablet   Oral   Take 81 mg by mouth daily.         Marland Kitchen etonogestrel (NEXPLANON) 68 MG IMPL implant   Subcutaneous   Inject 1 each into the skin continuous. 2014         . omeprazole (PRILOSEC) 20 MG capsule   Oral   Take 1 capsule (20 mg total) by mouth 2 (two) times daily.   60 capsule   0    BP 114/67  Temp(Src) 97.8 F (36.6 C) (Oral)  Resp 17  Ht 4\' 10"  (1.473 m)  Wt 210 lb (  95.255 kg)  BMI 43.90 kg/m2  SpO2 100%  LMP 12/29/2012 Physical Exam  Constitutional: She is oriented to person, place, and time. She appears well-developed and well-nourished. No distress.  Obese female. No acute distress.  HENT:  Head: Normocephalic.  Eyes: Conjunctivae are normal. Pupils are equal, round, and reactive to light. No scleral icterus.  Neck: Normal range of motion. Neck supple. No thyromegaly present.  Cardiovascular: Normal rate and regular rhythm.  Exam reveals no gallop and no friction rub.   No murmur heard. Pulmonary/Chest: Effort normal and breath sounds normal. No respiratory distress. She has no decreased breath sounds. She has no wheezes. She has no rhonchi. She has no rales.  Abdominal: Soft.  Bowel sounds are normal. She exhibits no distension. There is no tenderness. There is no rebound.   No pain abdomen. Bowel sounds normal.  Musculoskeletal: Normal range of motion.  Neurological: She is alert and oriented to person, place, and time.  Skin: Skin is warm and dry. No rash noted.  Psychiatric: She has a normal mood and affect. Her behavior is normal.    ED Course  Procedures (including critical care time) Labs Review Labs Reviewed - No data to display Imaging Review No results found.  EKG Interpretation   None       MDM   1. GERD (gastroesophageal reflux disease)   2. Diarrhea    EKG normal.  Sx consistent with reflux.  Plan the large meal before laying supine.  PPI.    Roney Marion, MD 02/04/13 [phone]

## 2013-02-04 NOTE — ED Notes (Signed)
Pt states she has had mid sternal chest pain which radiates to upper L side of chest since this AM. Pt states pain is constant and feels like a dull ache now. Pt took some Alka Seltzer and it helped relieve the pain a bit. Pt states she is also nauseous, but took some nausea medicine and it helped. Pt states the pain is worse when she lies down and the pain improves when she is on her L side. Pt also states she has had diarrhea since noon today. Pt with no acute distress. Skin warm, dry.

## 2013-02-17 ENCOUNTER — Emergency Department (HOSPITAL_COMMUNITY)
Admission: EM | Admit: 2013-02-17 | Discharge: 2013-02-17 | Disposition: A | Discharge status: Home / Self Care | Payer: Medicaid Other | Attending: Emergency Medicine | Admitting: Emergency Medicine

## 2013-02-17 ENCOUNTER — Encounter (HOSPITAL_COMMUNITY): Payer: Self-pay | Admitting: Emergency Medicine

## 2013-02-17 ENCOUNTER — Encounter: Payer: Self-pay | Admitting: Obstetrics

## 2013-02-17 ENCOUNTER — Ambulatory Visit (INDEPENDENT_AMBULATORY_CARE_PROVIDER_SITE_OTHER): Payer: Medicaid Other | Admitting: Obstetrics

## 2013-02-17 VITALS — BP 122/82 | HR 87 | Temp 97.5°F | Ht <= 58 in | Wt 225.0 lb

## 2013-02-17 DIAGNOSIS — Z7982 Long term (current) use of aspirin: Secondary | ICD-10-CM | POA: Insufficient documentation

## 2013-02-17 DIAGNOSIS — Z87440 Personal history of urinary (tract) infections: Secondary | ICD-10-CM | POA: Insufficient documentation

## 2013-02-17 DIAGNOSIS — Z8619 Personal history of other infectious and parasitic diseases: Secondary | ICD-10-CM | POA: Insufficient documentation

## 2013-02-17 DIAGNOSIS — Z79899 Other long term (current) drug therapy: Secondary | ICD-10-CM | POA: Insufficient documentation

## 2013-02-17 DIAGNOSIS — Z3009 Encounter for other general counseling and advice on contraception: Secondary | ICD-10-CM

## 2013-02-17 DIAGNOSIS — Z862 Personal history of diseases of the blood and blood-forming organs and certain disorders involving the immune mechanism: Secondary | ICD-10-CM | POA: Insufficient documentation

## 2013-02-17 DIAGNOSIS — F411 Generalized anxiety disorder: Secondary | ICD-10-CM | POA: Insufficient documentation

## 2013-02-17 DIAGNOSIS — M25579 Pain in unspecified ankle and joints of unspecified foot: Secondary | ICD-10-CM | POA: Insufficient documentation

## 2013-02-17 DIAGNOSIS — Z872 Personal history of diseases of the skin and subcutaneous tissue: Secondary | ICD-10-CM | POA: Insufficient documentation

## 2013-02-17 DIAGNOSIS — Z3046 Encounter for surveillance of implantable subdermal contraceptive: Secondary | ICD-10-CM

## 2013-02-17 DIAGNOSIS — K219 Gastro-esophageal reflux disease without esophagitis: Secondary | ICD-10-CM | POA: Insufficient documentation

## 2013-02-17 DIAGNOSIS — Z8719 Personal history of other diseases of the digestive system: Secondary | ICD-10-CM | POA: Insufficient documentation

## 2013-02-17 DIAGNOSIS — M79671 Pain in right foot: Secondary | ICD-10-CM

## 2013-02-17 DIAGNOSIS — F17200 Nicotine dependence, unspecified, uncomplicated: Secondary | ICD-10-CM | POA: Insufficient documentation

## 2013-02-17 LAB — CBC WITH DIFFERENTIAL/PLATELET
Basophils Absolute: 0 10*3/uL (ref 0.0–0.1)
Basophils Relative: 0 % (ref 0–1)
Eosinophils Absolute: 0.1 10*3/uL (ref 0.0–0.7)
Eosinophils Relative: 1 % (ref 0–5)
HCT: 35.7 % — ABNORMAL LOW (ref 36.0–46.0)
MCH: 29.7 pg (ref 26.0–34.0)
MCHC: 34.7 g/dL (ref 30.0–36.0)
MCV: 85.4 fL (ref 78.0–100.0)
Monocytes Absolute: 0.5 10*3/uL (ref 0.1–1.0)
Platelets: 258 10*3/uL (ref 150–400)
RDW: 13 % (ref 11.5–15.5)

## 2013-02-17 LAB — POCT I-STAT, CHEM 8
Calcium, Ion: 1.24 mmol/L — ABNORMAL HIGH (ref 1.12–1.23)
Chloride: 104 mEq/L (ref 96–112)
HCT: 38 % (ref 36.0–46.0)
Hemoglobin: 12.9 g/dL (ref 12.0–15.0)
Sodium: 142 mEq/L (ref 135–145)
TCO2: 22 mmol/L (ref 0–100)

## 2013-02-17 MED ORDER — IBUPROFEN 600 MG PO TABS: 600.0000 mg | ORAL_TABLET | Freq: Four times a day (QID) | ORAL | Status: DC | PRN

## 2013-02-17 MED ORDER — RANITIDINE HCL 150 MG PO TABS: 150.0000 mg | ORAL_TABLET | Freq: Two times a day (BID) | ORAL | Status: DC

## 2013-02-17 MED ORDER — MEDROXYPROGESTERONE ACETATE 150 MG/ML IM SUSP: 150.0000 mg | INTRAMUSCULAR | Status: DC

## 2013-02-17 MED ORDER — HYDROCODONE-ACETAMINOPHEN 5-325 MG PO TABS
1.0000 | ORAL_TABLET | Freq: Once | ORAL | Status: AC
  Administered 2013-02-17: 1 via ORAL
  Filled 2013-02-17: qty 1

## 2013-02-17 NOTE — ED Provider Notes (Signed)
CSN: [id number]     Arrival date & time 02/17/13  0414 History   First MD Initiated Contact with Patient 02/17/13 [phone]     Chief Complaint  Patient presents with  . Foot Pain   (Consider location/radiation/quality/duration/timing/severity/associated sxs/prior Treatment) HPI Comments: Patient states that on Friday night she smoked a cigarette and did not bilateral upper extremity pain that resolved spontaneously last night she states she developed pain in the instep of her right foot and 11:00 with swelling denies any injury trauma she states she thinks she had a blood clot because of the smoking and the fact that she has an Nexplanom in her left upper arm. She works at [employer] and on her feet for 12 hour shift she states she wears a good supportive sneaker Denies any personal history of hypertension or diabetes At this time she is denying chest pain or shortness of breath except when she lays flat on her back she had a little bit of shortness of breath  Patient is a 27 y.o. female presenting with lower extremity pain. The history is provided by the patient.  Foot Pain This is a recurrent problem. The current episode started today. The problem has been unchanged. Associated symptoms include arthralgias. Pertinent negatives include no chest pain, fever, joint swelling or rash. Nothing aggravates the symptoms. She has tried nothing for the symptoms. The treatment provided no relief.    Past Medical History  Diagnosis Date  . No pertinent past medical history   . Gallstones   . Chlamydia   . Anemia   . Sickle cell trait   . Infection     urinary tract infection  . Eczema   . Abnormal Pap smear     HPV   Past Surgical History  Procedure Laterality Date  . Cholecystectomy  2011  . Appendectomy  2010   Family History  Problem Relation Age of Onset  . Hypertension Mother   . Heart disease Mother   . Hypertension Maternal Grandmother   . Heart disease Maternal Grandmother   .  Other Neg Hx    History  Substance Use Topics  . Smoking status: Current Every Day Smoker -- 0.25 packs/day    Types: Cigarettes  . Smokeless tobacco: Never Used  . Alcohol Use: Yes   OB History   Grav Para Term Preterm Abortions TAB SAB Ect Mult Living   4 4 4  0 0 0 0 0 0 4     Review of Systems  Constitutional: Negative for fever.  Respiratory: Negative for shortness of breath.   Cardiovascular: Negative for chest pain and leg swelling.  Musculoskeletal: Positive for arthralgias. Negative for joint swelling.  Skin: Negative for rash and wound.  Psychiatric/Behavioral: The patient is nervous/anxious.   All other systems reviewed and are negative.    Allergies  Sulfa antibiotics; Sulfur; and Other  Home Medications   Current Outpatient Rx  Name  Route  Sig  Dispense  Refill  . aspirin EC 81 MG tablet   Oral   Take 81 mg by mouth every morning.          . etonogestrel (NEXPLANON) 68 MG IMPL implant   Subcutaneous   Inject 1 each into the skin continuous. 2014         . ibuprofen (ADVIL,MOTRIN) 600 MG tablet   Oral   Take 1 tablet (600 mg total) by mouth every 6 (six) hours as needed.   30 tablet   0   . omeprazole (  PRILOSEC) 20 MG capsule   Oral   Take 1 capsule (20 mg total) by mouth 2 (two) times daily.   60 capsule   0    BP 128/77  Pulse 87  Temp(Src) 98.4 F (36.9 C) (Oral)  Resp 16  Ht 4\' 10"  (1.473 m)  Wt 225 lb (102.059 kg)  BMI 47.04 kg/m2  SpO2 100% Physical Exam  Nursing note and vitals reviewed. Constitutional: She is oriented to person, place, and time. She appears well-developed and well-nourished.  Morbid obesity  HENT:  Head: Normocephalic.  Right Ear: External ear normal.  Left Ear: External ear normal.  Eyes: Pupils are equal, round, and reactive to light.  Neck: Normal range of motion.  Cardiovascular: Normal rate.   Pulmonary/Chest: Effort normal.  Musculoskeletal: Normal range of motion.  Neurological: She is alert and  oriented to person, place, and time.  Skin: Skin is warm. No rash noted. No erythema.    ED Course  Procedures (including critical care time) Labs Review Labs Reviewed  CBC WITH DIFFERENTIAL - Abnormal; Notable for the following:    WBC 10.7 (*)    HCT 35.7 (*)    All other components within normal limits  POCT I-STAT, CHEM 8 - Abnormal; Notable for the following:    Calcium, Ion 1.24 (*)    All other components within normal limits  D-DIMER, QUANTITATIVE   Imaging Review No results found.  EKG Interpretation   None       MDM   1. Foot pain, right     Her risk factor for DVT is Obesity and BC but with a negative D Dimer it is reassuring.     Arman Filter, NP 02/17/13 [phone]

## 2013-02-17 NOTE — ED Notes (Signed)
Pt arrived to the ED with a complaint of foot pain and extremity swelling.  Pt states that this condition has been present since 2300 hrs yesterday.  Pt states she believes she has a blood clot in her foot due to the the extreme pain.

## 2013-02-17 NOTE — Progress Notes (Signed)
Subjective:     Bianca Cantrell is a 27 y.o. female here for a routine exam.  Current complaints: problem with her birth control. Pt states she is having pain in her left arm. Pt states she is having tingling in her hands and finegers.   Personal health questionnaire reviewed: yes.   Gynecologic History No LMP recorded. Patient has had an implant. Contraception: Nexplanon   Obstetric History OB History  Gravida Para Term Preterm AB SAB TAB Ectopic Multiple Living  4 4 4  0 0 0 0 0 0 4    # Outcome Date GA Lbr Len/2nd Weight Sex Delivery Anes PTL Lv  4 TRM 01/07/12 [redacted]w[redacted]d 04:00 / 00:04 7 lb 15.5 oz (3.615 kg) F SVD None  Y  3 TRM 02/11/11 [redacted]w[redacted]d 12:30 / 00:26 7 lb 8.3 oz (3.41 kg) F SVD EPI  Y  2 TRM     M SVD EPI  Y  1 TRM     M SVD EPI  Y       The following portions of the patient's history were reviewed and updated as appropriate: allergies, current medications, past family history, past medical history, past social history, past surgical history and problem list.  Review of Systems Pertinent items are noted in HPI.    Objective:    General appearance: alert and no distress Extremities: Left upper arm:  Rod palpated.  Intact and NT.    Assessment:    Nexplanon surveillance.   Plan:    Education reviewed: Contraceptive options.. Contraception: Depo-Provera injections. Follow up in: 3 days. Depo Provera Rx.

## 2013-02-18 ENCOUNTER — Encounter: Payer: Self-pay | Admitting: Obstetrics

## 2013-02-20 ENCOUNTER — Ambulatory Visit: Payer: Medicaid Other | Admitting: Obstetrics

## 2013-02-20 ENCOUNTER — Ambulatory Visit: Payer: Medicaid Other | Attending: Internal Medicine

## 2013-02-20 VITALS — BP 128/86 | HR 87 | Temp 98.7°F | Resp 16 | Ht <= 58 in | Wt 225.0 lb

## 2013-02-20 DIAGNOSIS — Z0000 Encounter for general adult medical examination without abnormal findings: Secondary | ICD-10-CM

## 2013-02-20 DIAGNOSIS — Z3009 Encounter for other general counseling and advice on contraception: Secondary | ICD-10-CM

## 2013-02-20 DIAGNOSIS — G8929 Other chronic pain: Secondary | ICD-10-CM

## 2013-02-20 LAB — CBC WITH DIFFERENTIAL/PLATELET
Basophils Absolute: 0 10*3/uL (ref 0.0–0.1)
Eosinophils Absolute: 0.1 10*3/uL (ref 0.0–0.7)
Eosinophils Relative: 1 % (ref 0–5)
Lymphocytes Relative: 33 % (ref 12–46)
Lymphs Abs: 2.9 10*3/uL (ref 0.7–4.0)
MCH: 29.2 pg (ref 26.0–34.0)
MCV: 85.1 fL (ref 78.0–100.0)
Monocytes Absolute: 0.6 10*3/uL (ref 0.1–1.0)
Neutrophils Relative %: 60 % (ref 43–77)
Platelets: 323 10*3/uL (ref 150–400)
RBC: 4.55 MIL/uL (ref 3.87–5.11)
RDW: 14.1 % (ref 11.5–15.5)
WBC: 8.8 10*3/uL (ref 4.0–10.5)

## 2013-02-20 LAB — GLUCOSE, POCT (MANUAL RESULT ENTRY): POC Glucose: 98 mg/dl (ref 70–99)

## 2013-02-20 MED ORDER — OMEPRAZOLE 40 MG PO CPDR: 40.0000 mg | DELAYED_RELEASE_CAPSULE | Freq: Every day | ORAL | Status: DC

## 2013-02-20 NOTE — Patient Instructions (Signed)
Esophagitis  Esophagitis is inflammation of the esophagus. It can involve swelling, soreness, and pain in the esophagus. This condition can make it difficult and painful to swallow.  CAUSES   Most causes of esophagitis are not serious. Many different factors can cause esophagitis, including:   Gastroesophageal reflux disease (GERD). This is when acid from your stomach flows up into the esophagus.   Recurrent vomiting.   An allergic-type reaction.   Certain medicines, especially those that come in large pills.   Ingestion of harmful chemicals, such as household cleaning products.   Heavy alcohol use.   An infection of the esophagus.   Radiation treatment for cancer.   Certain diseases such as sarcoidosis, Crohn's disease, and scleroderma. These diseases may cause recurrent esophagitis.  SYMPTOMS    Trouble swallowing.   Painful swallowing.   Chest pain.   Difficulty breathing.   Nausea.   Vomiting.   Abdominal pain.  DIAGNOSIS   Your caregiver will take your history and do a physical exam. Depending upon what your caregiver finds, certain tests may also be done, including:   Barium X-ray. You will drink a solution that coats the esophagus, and X-rays will be taken.   Endoscopy. A lighted tube is put down the esophagus so your caregiver can examine the area.   Allergy tests. These can sometimes be arranged through follow-up visits.  TREATMENT   Treatment will depend on the cause of your esophagitis. In some cases, steroids or other medicines may be given to help relieve your symptoms or to treat the underlying cause of your condition. Medicines that may be recommended include:   Viscous lidocaine, to soothe the esophagus.   Antacids.   Acid reducers.   Proton pump inhibitors.   Antiviral medicines for certain viral infections of the esophagus.   Antifungal medicines for certain fungal infections of the esophagus.   Antibiotic medicines, depending on the cause of the esophagitis.  HOME CARE  INSTRUCTIONS    Avoid foods and drinks that seem to make your symptoms worse.   Eat small, frequent meals instead of large meals.   Avoid eating for the 3 hours prior to your bedtime.   If you have trouble taking pills, use a pill splitter to decrease the size and likelihood of the pill getting stuck or injuring the esophagus on the way down. Drinking water after taking a pill also helps.   Stop smoking if you smoke.   Maintain a healthy weight.   Wear loose-fitting clothing. Do not wear anything tight around your waist that causes pressure on your stomach.   Raise the head of your bed 6 to 8 inches with Madelynne Lasker blocks to help you sleep. Extra pillows will not help.   Only take over-the-counter or prescription medicines as directed by your caregiver.  SEEK IMMEDIATE MEDICAL CARE IF:   You have severe chest pain that radiates into your arm, neck, or jaw.   You feel sweaty, dizzy, or lightheaded.   You have shortness of breath.   You vomit blood.   You have difficulty or pain with swallowing.   You have bloody or black, tarry stools.   You have a fever.   You have a burning sensation in the chest more than 3 times a week for more than 2 weeks.   You cannot swallow, drink, or eat.   You drool because you cannot swallow your saliva.  MAKE SURE YOU:   Understand these instructions.   Will watch your condition.     Will get help right away if you are not doing well or get worse.  Document Released: 03/30/2004 Document Revised: 05/15/2011 Document Reviewed: 10/21/2010  [employer] Patient Information 2014 [employer].

## 2013-02-20 NOTE — Progress Notes (Unsigned)
Subjective:    Patient ID: Bianca Cantrell, female    DOB: August 25, 1985, 27 y.o.   MRN: [id number]  HPI This patient is here today to establish care. Her main concern is acid reflux. She was recently started on Zantac 150 mg once a day and it has helped for a few hours but then wears off. A heartburn started approximately 4 weeks ago. She does number any inciting event that they did start. It is worse after junk food such as hamburgers french fries and pizza and does not occur started with fruits vegetables mother healthy foods. She also notices that she gets a headache when she gets the heartburn it comes on quickly , involves her whole head, and resolves after a little bit of rest in a dark room. She also says that along with the heartburn she gets tingling in her fingers and toes a few minutes after the heartburn. She does admit she feels anxious when she gets the heartburn. Her heartburn is typical for her as she has had in the past. She denies any shortness of breath chest pressure or radiation or diaphoresis with it. She does complain of nausea but has not had vomiting.   Review of Systems 12 point review of systems negative except for as per history of present illness     Objective:   Physical Exam  Nursing note and vitals reviewed. Constitutional: She is oriented to person, place, and time. She appears well-developed and well-nourished.  HENT:  Mouth/Throat: Oropharynx is clear and moist. No oropharyngeal exudate.  Cardiovascular: Normal rate, regular rhythm and normal heart sounds.   Pulmonary/Chest: Effort normal and breath sounds normal.  Abdominal: Soft. Bowel sounds are normal. She exhibits no distension. There is  Tenderness in the epigastrum. There is no rebound.  Lymphadenopathy:    She has no cervical adenopathy.  Neurological: She is alert and oriented to person, place, and time. She has normal reflexes.  Skin: Skin is warm and dry.  Psychiatric: She has a normal mood and  affect. Her behavior is normal.        Assessment & Plan:  Brittany was seen today for establish care and annual exam.  Diagnoses and associated orders for this visit:  Preventative health care - Glucose (CBG)  Abdominal pain, chronic, epigastric - H. pylori antibody, IgG - Comprehensive metabolic panel - CBC with Differential - Urinalysis Dipstick - omeprazole (PRILOSEC) 40 MG capsule; Take 1 capsule (40 mg total) by mouth daily.  For this patient's abdominal pain we'll check the above labs. We'll also change her from Zantac to Prilosec to try to get her some symptomatic relief. I'll plan to see her back in about one month and if symptoms are not resolving we'll refer her to GI for consideration of endoscopy.  Regarding her preventative health care she is due for physical exam. Will do that at her next visit. If she gets worse, has chest pain, vomiting of blood or blood per she will go to the emergency room. She will call with any questions before that time.  We also discussed contraception. She had planned to see her OB/GYN today to have her nexplanonn removed. It was placed in August. She is concerned that the above referenced symptoms are due to her nexplanon. [facility] and he did not think the nexplanon caused her symptoms  I have told her that I also don't think his symptoms are possibly next. In fact for her to stop nexplanon and start another  birth control will probably give her more symptoms for example weight gain with the Depo-Provera she had planned on taking. My suggestion to her is to stay on the nexplanon For now while we sort out what else is going on. She does have a family history of blood clot in her aunt which seems to have been unprovoked. If she does need to stop nexplanon, ParaGard might be something for her to think about but asked her to research that a little bit in the next month.

## 2013-02-20 NOTE — Progress Notes (Unsigned)
Pt here to establish care Concerned with sx tingling in both feet with h/a after eating x 1 mnth Family hx diabetes,htn Quit smoking x 5 dys ago

## 2013-02-21 LAB — COMPREHENSIVE METABOLIC PANEL
AST: 17 U/L (ref 0–37)
Albumin: 4.1 g/dL (ref 3.5–5.2)
Alkaline Phosphatase: 73 U/L (ref 39–117)
BUN: 9 mg/dL (ref 6–23)
Chloride: 104 mEq/L (ref 96–112)
Creat: 0.59 mg/dL (ref 0.50–1.10)
Potassium: 3.7 mEq/L (ref 3.5–5.3)
Total Bilirubin: 0.3 mg/dL (ref 0.3–1.2)

## 2013-02-21 LAB — H. PYLORI ANTIBODY, IGG: H Pylori IgG: >8 {ISR} — ABNORMAL HIGH

## 2013-02-21 NOTE — ED Provider Notes (Signed)
Medical screening examination/treatment/procedure(s) were performed by non-physician practitioner and as supervising physician I was immediately available for consultation/collaboration.  EKG Interpretation   None         Gwyneth Sprout, MD 02/21/13 0825

## 2013-02-24 ENCOUNTER — Ambulatory Visit: Payer: Medicaid Other | Admitting: Obstetrics

## 2013-03-26 ENCOUNTER — Encounter (HOSPITAL_COMMUNITY): Payer: Self-pay | Admitting: Emergency Medicine

## 2013-03-26 ENCOUNTER — Emergency Department (HOSPITAL_COMMUNITY)
Admission: EM | Admit: 2013-03-26 | Discharge: 2013-03-26 | Disposition: A | Discharge status: Home / Self Care | Payer: Medicaid Other | Source: Home / Self Care | Attending: Family Medicine | Admitting: Family Medicine

## 2013-03-26 DIAGNOSIS — A048 Other specified bacterial intestinal infections: Secondary | ICD-10-CM

## 2013-03-26 DIAGNOSIS — R1013 Epigastric pain: Secondary | ICD-10-CM

## 2013-03-26 LAB — COMPREHENSIVE METABOLIC PANEL
ALT: 32 U/L (ref 0–35)
AST: 24 U/L (ref 0–37)
Albumin: 3.7 g/dL (ref 3.5–5.2)
Alkaline Phosphatase: 84 U/L (ref 39–117)
BUN: 8 mg/dL (ref 6–23)
CALCIUM: 9.4 mg/dL (ref 8.4–10.5)
CO2: 23 mEq/L (ref 19–32)
CREATININE: 0.53 mg/dL (ref 0.50–1.10)
Chloride: 103 mEq/L (ref 96–112)
GLUCOSE: 85 mg/dL (ref 70–99)
Potassium: 4 mEq/L (ref 3.7–5.3)
Sodium: 140 mEq/L (ref 137–147)
Total Bilirubin: 0.3 mg/dL (ref 0.3–1.2)
Total Protein: 7.9 g/dL (ref 6.0–8.3)

## 2013-03-26 LAB — CBC
HCT: 38.9 % (ref 36.0–46.0)
HEMOGLOBIN: 13.6 g/dL (ref 12.0–15.0)
MCH: 30.4 pg (ref 26.0–34.0)
MCHC: 35 g/dL (ref 30.0–36.0)
MCV: 87 fL (ref 78.0–100.0)
Platelets: 288 10*3/uL (ref 150–400)
RBC: 4.47 MIL/uL (ref 3.87–5.11)
RDW: 13 % (ref 11.5–15.5)
WBC: 8.9 10*3/uL (ref 4.0–10.5)

## 2013-03-26 LAB — LIPASE, BLOOD: Lipase: 35 U/L (ref 11–59)

## 2013-03-26 MED ORDER — AMOXICILLIN 500 MG PO CAPS: 1000.0000 mg | ORAL_CAPSULE | Freq: Two times a day (BID) | ORAL | Status: DC

## 2013-03-26 MED ORDER — OMEPRAZOLE 20 MG PO CPDR: 20.0000 mg | DELAYED_RELEASE_CAPSULE | Freq: Two times a day (BID) | ORAL | Status: DC

## 2013-03-26 MED ORDER — CLARITHROMYCIN 500 MG PO TABS: 500.0000 mg | ORAL_TABLET | Freq: Two times a day (BID) | ORAL | Status: DC

## 2013-03-26 NOTE — ED Provider Notes (Signed)
Bianca Cantrell is a 28 y.o. female who presents to Urgent Care today for epigastric abdominal pain radiating to the upper back. This is [place] for the last several years. Her symptoms started after she had a laparoscopic cholecystectomy and appendectomy. However her symptoms returned over the past few days. She notes specifically her pain is much worse after she eats a fatty meal. The pain was moderate to severe yesterday. She denies any nausea vomiting or diarrhea. She feels well otherwise with no fevers or chills.  She works at [employer]   Past Medical History  Diagnosis Date  . No pertinent past medical history   . Gallstones   . Chlamydia   . Anemia   . Sickle cell trait   . Infection     urinary tract infection  . Eczema   . Abnormal Pap smear     HPV   History  Substance Use Topics  . Smoking status: Former Smoker -- 0.25 packs/day for 1 years    Types: Cigarettes    Quit date: 02/15/2013  . Smokeless tobacco: Never Used  . Alcohol Use: Yes     Comment: Socially    ROS as above Medications: No current facility-administered medications for this encounter.   Current Outpatient Prescriptions  Medication Sig Dispense Refill  . amoxicillin (AMOXIL) 500 MG capsule Take 2 capsules (1,000 mg total) by mouth 2 (two) times daily.  56 capsule  0  . aspirin EC 81 MG tablet Take 81 mg by mouth every morning.       . clarithromycin (BIAXIN) 500 MG tablet Take 1 tablet (500 mg total) by mouth 2 (two) times daily.  28 tablet  0  . etonogestrel (NEXPLANON) 68 MG IMPL implant Inject 1 each into the skin continuous. 2014      . medroxyPROGESTERone (DEPO-PROVERA) 150 MG/ML injection Inject 1 mL (150 mg total) into the muscle every 3 (three) months.  1 mL  0  . omeprazole (PRILOSEC) 20 MG capsule Take 1 capsule (20 mg total) by mouth 2 (two) times daily.  60 capsule  0  . ranitidine (ZANTAC) 150 MG tablet Take 1 tablet (150 mg total) by mouth 2 (two) times daily.  60 tablet  11     Exam:  BP 121/75  Pulse 88  Temp(Src) 98.4 F (36.9 C) (Oral)  SpO2 98% Gen: Well NAD morbidly obese HEENT: EOMI,  MMM Lungs: Normal work of breathing. CTABL Heart: RRR no MRG Abd: NABS, Soft. NT, ND Exts: Brisk capillary refill, warm and well perfused.   Results for orders placed during the hospital encounter of 03/26/13 (from the past 24 hour(s))  CBC     Status: None   Collection Time    03/26/13  3:38 PM      Result Value Range   WBC 8.9  4.0 - 10.5 K/uL   RBC 4.47  3.87 - 5.11 MIL/uL   Hemoglobin 13.6  12.0 - 15.0 g/dL   HCT 16.1  09.6 - 04.5 %   MCV 87.0  78.0 - 100.0 fL   MCH 30.4  26.0 - 34.0 pg   MCHC 35.0  30.0 - 36.0 g/dL   RDW 40.9  81.1 - 91.4 %   Platelets 288  150 - 400 K/uL  COMPREHENSIVE METABOLIC PANEL     Status: None   Collection Time    03/26/13  3:38 PM      Result Value Range   Sodium 140  137 - 147 mEq/L  Potassium 4.0  3.7 - 5.3 mEq/L   Chloride 103  96 - 112 mEq/L   CO2 23  19 - 32 mEq/L   Glucose, Bld 85  70 - 99 mg/dL   BUN 8  6 - 23 mg/dL   Creatinine, Ser 1.610.53  0.50 - 1.10 mg/dL   Calcium 9.4  8.4 - 09.610.5 mg/dL   Total Protein 7.9  6.0 - 8.3 g/dL   Albumin 3.7  3.5 - 5.2 g/dL   AST 24  0 - 37 U/L   ALT 32  0 - 35 U/L   Alkaline Phosphatase 84  39 - 117 U/L   Total Bilirubin 0.3  0.3 - 1.2 mg/dL   GFR calc non Af Amer >90  >90 mL/min   GFR calc Af Amer >90  >90 mL/min  LIPASE, BLOOD     Status: None   Collection Time    03/26/13  3:38 PM      Result Value Range   Lipase 35  11 - 59 U/L   Positive H. pylori antibody December 2014  No results found.  Assessment and Plan: 28 y.o. female with epigastric abdominal pain of unclear etiology. Patient has a history of exploring has never had treatment. I suspect this is the most likely cause. Will treat with triple therapy of omeprazole amoxicillin and clarithromycin for 2 weeks. Followup with primary care provider.  Discussed warning signs or symptoms. Please see discharge  instructions. Patient expresses understanding.    Rodolph BongEvan S Omauri Boeve, MD 03/26/13 [phone]

## 2013-03-26 NOTE — Discharge Instructions (Signed)
Thank you for coming in today. Take omeprazole, amoxicillin, clarithromycin twice daily for 2 weeks. Followup with your primary care provider If you get dramatically worse go to the emergency room. If your belly pain worsens, or you have high fever, bad vomiting, blood in your stool or black tarry stool go to the Emergency Room.

## 2013-03-26 NOTE — ED Notes (Signed)
Dr. Denyse Amassorey is in the room w/the pt C/o abd/epigastric pain onset yest that radiates to the back Pain increases when she leans forward Hx of GERD, abd surg Alert w/no signs of acute distress.

## 2013-04-15 ENCOUNTER — Emergency Department (HOSPITAL_COMMUNITY)
Admission: EM | Admit: 2013-04-15 | Discharge: 2013-04-15 | Disposition: A | Discharge status: Home / Self Care | Payer: Medicaid Other | Attending: Emergency Medicine | Admitting: Emergency Medicine

## 2013-04-15 ENCOUNTER — Encounter (HOSPITAL_COMMUNITY): Payer: Self-pay | Admitting: Emergency Medicine

## 2013-04-15 ENCOUNTER — Telehealth: Payer: Self-pay | Admitting: Internal Medicine

## 2013-04-15 ENCOUNTER — Emergency Department (HOSPITAL_COMMUNITY): Payer: Medicaid Other

## 2013-04-15 ENCOUNTER — Ambulatory Visit: Payer: Medicaid Other | Admitting: Internal Medicine

## 2013-04-15 DIAGNOSIS — Z862 Personal history of diseases of the blood and blood-forming organs and certain disorders involving the immune mechanism: Secondary | ICD-10-CM | POA: Insufficient documentation

## 2013-04-15 DIAGNOSIS — R630 Anorexia: Secondary | ICD-10-CM | POA: Insufficient documentation

## 2013-04-15 DIAGNOSIS — R1013 Epigastric pain: Secondary | ICD-10-CM | POA: Insufficient documentation

## 2013-04-15 DIAGNOSIS — R072 Precordial pain: Secondary | ICD-10-CM | POA: Insufficient documentation

## 2013-04-15 DIAGNOSIS — M549 Dorsalgia, unspecified: Secondary | ICD-10-CM | POA: Insufficient documentation

## 2013-04-15 DIAGNOSIS — Z872 Personal history of diseases of the skin and subcutaneous tissue: Secondary | ICD-10-CM | POA: Insufficient documentation

## 2013-04-15 DIAGNOSIS — R1310 Dysphagia, unspecified: Secondary | ICD-10-CM | POA: Insufficient documentation

## 2013-04-15 DIAGNOSIS — Z87891 Personal history of nicotine dependence: Secondary | ICD-10-CM | POA: Insufficient documentation

## 2013-04-15 DIAGNOSIS — R110 Nausea: Secondary | ICD-10-CM | POA: Insufficient documentation

## 2013-04-15 DIAGNOSIS — Z79899 Other long term (current) drug therapy: Secondary | ICD-10-CM | POA: Insufficient documentation

## 2013-04-15 DIAGNOSIS — Z8619 Personal history of other infectious and parasitic diseases: Secondary | ICD-10-CM | POA: Insufficient documentation

## 2013-04-15 DIAGNOSIS — Z3202 Encounter for pregnancy test, result negative: Secondary | ICD-10-CM | POA: Insufficient documentation

## 2013-04-15 LAB — URINE MICROSCOPIC-ADD ON

## 2013-04-15 LAB — URINALYSIS, ROUTINE W REFLEX MICROSCOPIC
Bilirubin Urine: NEGATIVE
Glucose, UA: NEGATIVE mg/dL
HGB URINE DIPSTICK: NEGATIVE
Ketones, ur: NEGATIVE mg/dL
Nitrite: NEGATIVE
PROTEIN: NEGATIVE mg/dL
Specific Gravity, Urine: 1.016 (ref 1.005–1.030)
Urobilinogen, UA: 0.2 mg/dL (ref 0.0–1.0)
pH: 7 (ref 5.0–8.0)

## 2013-04-15 LAB — COMPREHENSIVE METABOLIC PANEL
ALT: 31 U/L (ref 0–35)
AST: 22 U/L (ref 0–37)
Albumin: 4.1 g/dL (ref 3.5–5.2)
Alkaline Phosphatase: 79 U/L (ref 39–117)
BUN: 10 mg/dL (ref 6–23)
CO2: 23 meq/L (ref 19–32)
Calcium: 9.6 mg/dL (ref 8.4–10.5)
Chloride: 107 mEq/L (ref 96–112)
Creatinine, Ser: 0.52 mg/dL (ref 0.50–1.10)
GFR calc non Af Amer: >90 mL/min (ref >90)
GLUCOSE: 85 mg/dL (ref 70–99)
POTASSIUM: 4.2 meq/L (ref 3.7–5.3)
Sodium: 144 mEq/L (ref 137–147)
TOTAL PROTEIN: 8.5 g/dL — AB (ref 6.0–8.3)
Total Bilirubin: 0.4 mg/dL (ref 0.3–1.2)

## 2013-04-15 LAB — CBC WITH DIFFERENTIAL/PLATELET
Basophils Absolute: 0 10*3/uL (ref 0.0–0.1)
Basophils Relative: 0 % (ref 0–1)
EOS ABS: 0.1 10*3/uL (ref 0.0–0.7)
Eosinophils Relative: 1 % (ref 0–5)
HCT: 40.6 % (ref 36.0–46.0)
Hemoglobin: 14.1 g/dL (ref 12.0–15.0)
LYMPHS PCT: 29 % (ref 12–46)
Lymphs Abs: 2.6 10*3/uL (ref 0.7–4.0)
MCH: 29.6 pg (ref 26.0–34.0)
MCHC: 34.7 g/dL (ref 30.0–36.0)
MCV: 85.3 fL (ref 78.0–100.0)
Monocytes Absolute: 0.6 10*3/uL (ref 0.1–1.0)
Monocytes Relative: 7 % (ref 3–12)
NEUTROS PCT: 63 % (ref 43–77)
Neutro Abs: 5.5 10*3/uL (ref 1.7–7.7)
PLATELETS: 275 10*3/uL (ref 150–400)
RBC: 4.76 MIL/uL (ref 3.87–5.11)
RDW: 12.9 % (ref 11.5–15.5)
WBC: 8.8 10*3/uL (ref 4.0–10.5)

## 2013-04-15 LAB — LIPASE, BLOOD: Lipase: 39 U/L (ref 11–59)

## 2013-04-15 LAB — PREGNANCY, URINE: Preg Test, Ur: NEGATIVE

## 2013-04-15 MED ORDER — GI COCKTAIL ~~LOC~~
30.0000 mL | Freq: Once | ORAL | Status: AC
  Administered 2013-04-15: 30 mL via ORAL
  Filled 2013-04-15: qty 30

## 2013-04-15 MED ORDER — IOHEXOL 300 MG/ML  SOLN
25.0000 mL | INTRAMUSCULAR | Status: DC
  Administered 2013-04-15: 25 mL via ORAL

## 2013-04-15 MED ORDER — IOHEXOL 300 MG/ML  SOLN
100.0000 mL | Freq: Once | INTRAMUSCULAR | Status: AC | PRN
  Administered 2013-04-15: 100 mL via INTRAVENOUS

## 2013-04-15 NOTE — ED Provider Notes (Signed)
CSN: [id number]     Arrival date & time 04/15/13  1058 History   First MD Initiated Contact with Patient 04/15/13 1132     Chief Complaint  Patient presents with  . Chest Pain  . Abdominal Pain    HPI  Bianca Cantrell is a 28 y.o. female with a PMH of gallstones s/p cholecystectomy, chlamydia, anemia, sickle cell trait, UTI, eczema, and HPV who presents to the ED for evaluation of chest and abdominal pain.  History was provided by the patient.  Patient states that she has had epigastric abdominal/chest pain for a few years. She states that she noticed she has a "lump" between her upper breasts which is painful. She reports increased pain with eating and difficulty swallowing solid foods. She describes her pain as a burning sensation, which radiates sometimes to her middle back and around her rib cage. She intermittently has nausea with no vomiting. She denies any diarrhea, however has had increased bowel movements and flatulence. Patient recently was seen in the emergency department on 03/26/13 for similar symptoms. She was prescribed omeprazole, amoxicillin and clarithromycin which she completed for a H Pylori infection. She states that last week she had bilateral lower back pain, which has resolved. She denies any lower abdominal pain. She denies any dysuria, hematuria, vaginal discharge, or vaginal bleeding. She is currently on Implanon. No fevers, chills, SOB, cough, dyspnea, headaches, lightheadedness, dizziness, or leg edema. Previous surgeries include a cholecystectomy and appendectomy.   Past Medical History  Diagnosis Date  . No pertinent past medical history   . Gallstones   . Chlamydia   . Anemia   . Sickle cell trait   . Infection     urinary tract infection  . Eczema   . Abnormal Pap smear     HPV   Past Surgical History  Procedure Laterality Date  . Cholecystectomy  2011  . Appendectomy  2010   Family History  Problem Relation Age of Onset  . Hypertension Mother   .  Heart disease Mother   . Hypertension Maternal Grandmother   . Heart disease Maternal Grandmother   . Other Neg Hx    History  Substance Use Topics  . Smoking status: Former Smoker -- 0.25 packs/day for 1 years    Types: Cigarettes    Quit date: 02/15/2013  . Smokeless tobacco: Never Used  . Alcohol Use: Yes     Comment: Socially    OB History   Grav Para Term Preterm Abortions TAB SAB Ect Mult Living   4 4 4  0 0 0 0 0 0 4     Review of Systems  Constitutional: Positive for appetite change. Negative for fever, chills, diaphoresis, activity change and fatigue.  HENT: Positive for trouble swallowing. Negative for congestion, rhinorrhea, sore throat and voice change.   Respiratory: Negative for cough, shortness of breath and wheezing.   Cardiovascular: Positive for chest pain (epigastric - lower mid-sternal). Negative for leg swelling.  Gastrointestinal: Positive for nausea and abdominal pain. Negative for vomiting, diarrhea, constipation, blood in stool and rectal pain.  Genitourinary: Negative for dysuria, hematuria, vaginal bleeding, vaginal discharge, difficulty urinating, vaginal pain and pelvic pain.  Musculoskeletal: Positive for back pain. Negative for myalgias.  Skin: Negative for color change and wound.  Neurological: Negative for dizziness, weakness, light-headedness and headaches.    Allergies  Sulfa antibiotics; Sulfur; and Other  Home Medications   Current Outpatient Rx  Name  Route  Sig  Dispense  Refill  .  etonogestrel (NEXPLANON) 68 MG IMPL implant   Subcutaneous   Inject 1 each into the skin continuous. 2014         . omeprazole (PRILOSEC) 20 MG capsule   Oral   Take 1 capsule (20 mg total) by mouth 2 (two) times daily.   60 capsule   0    BP 131/78  Pulse 88  Temp(Src) 98.1 F (36.7 C) (Oral)  Resp 22  SpO2 97%  Filed Vitals:   04/15/13 1106 04/15/13 1454 04/15/13 1500  BP: 131/78 135/85 113/68  Pulse: 88 80 78  Temp: 98.1 F (36.7 C)     TempSrc: Oral    Resp: 22    SpO2: 97% 100% 98%    Physical Exam  Nursing note and vitals reviewed. Constitutional: She is oriented to person, place, and time. She appears well-developed and well-nourished. No distress.  HENT:  Head: Normocephalic and atraumatic.  Right Ear: External ear normal.  Left Ear: External ear normal.  Nose: Nose normal.  Mouth/Throat: Oropharynx is clear and moist.  Eyes: Conjunctivae are normal. Right eye exhibits no discharge. Left eye exhibits no discharge.  Neck: Normal range of motion. Neck supple.  Cardiovascular: Normal rate, regular rhythm and normal heart sounds.  Exam reveals no gallop and no friction rub.   No murmur heard. Pulmonary/Chest: Effort normal and breath sounds normal. No respiratory distress. She has no wheezes. She has no rales. She exhibits tenderness.    Tenderness to palpation to the epigastric and lower middle-sternal region   Abdominal: Soft. She exhibits no distension and no mass. There is tenderness. There is no rebound and no guarding.  Musculoskeletal: Normal range of motion. She exhibits no edema and no tenderness.  No tenderness to palpation to the thoracic or lumbar spinous processes throughout.  No tenderness to palpation to the paraspinal muscles throughout. No LE edema or calf tenderness bilaterally  Neurological: She is alert and oriented to person, place, and time.  Skin: Skin is warm and dry. She is not diaphoretic.    ED Course  Procedures (including critical care time) Labs Review Labs Reviewed - No data to display Imaging Review No results found.  EKG Interpretation    Date/Time:  Tuesday April 15 2013 11:06:10 EST Ventricular Rate:  83 PR Interval:  166 QRS Duration: 78 QT Interval:  360 QTC Calculation: 423 R Axis:   40 Text Interpretation:  Normal sinus rhythm Normal ECG No significant change since last tracing Confirmed by KNAPP  MD-J, JON ([id number]) on 04/15/2013 12:09:58 PM            Results for orders placed during the hospital encounter of 04/15/13  CBC WITH DIFFERENTIAL      Result Value Range   WBC 8.8  4.0 - 10.5 K/uL   RBC 4.76  3.87 - 5.11 MIL/uL   Hemoglobin 14.1  12.0 - 15.0 g/dL   HCT 40.9  81.1 - 91.4 %   MCV 85.3  78.0 - 100.0 fL   MCH 29.6  26.0 - 34.0 pg   MCHC 34.7  30.0 - 36.0 g/dL   RDW 78.2  95.6 - 21.3 %   Platelets 275  150 - 400 K/uL   Neutrophils Relative % 63  43 - 77 %   Neutro Abs 5.5  1.7 - 7.7 K/uL   Lymphocytes Relative 29  12 - 46 %   Lymphs Abs 2.6  0.7 - 4.0 K/uL   Monocytes Relative 7  3 - 12 %  Monocytes Absolute 0.6  0.1 - 1.0 K/uL   Eosinophils Relative 1  0 - 5 %   Eosinophils Absolute 0.1  0.0 - 0.7 K/uL   Basophils Relative 0  0 - 1 %   Basophils Absolute 0.0  0.0 - 0.1 K/uL  COMPREHENSIVE METABOLIC PANEL      Result Value Range   Sodium 144  137 - 147 mEq/L   Potassium 4.2  3.7 - 5.3 mEq/L   Chloride 107  96 - 112 mEq/L   CO2 23  19 - 32 mEq/L   Glucose, Bld 85  70 - 99 mg/dL   BUN 10  6 - 23 mg/dL   Creatinine, Ser 1.610.52  0.50 - 1.10 mg/dL   Calcium 9.6  8.4 - 09.610.5 mg/dL   Total Protein 8.5 (*) 6.0 - 8.3 g/dL   Albumin 4.1  3.5 - 5.2 g/dL   AST 22  0 - 37 U/L   ALT 31  0 - 35 U/L   Alkaline Phosphatase 79  39 - 117 U/L   Total Bilirubin 0.4  0.3 - 1.2 mg/dL   GFR calc non Af Amer >90  >90 mL/min   GFR calc Af Amer >90  >90 mL/min  LIPASE, BLOOD      Result Value Range   Lipase 39  11 - 59 U/L  PREGNANCY, URINE      Result Value Range   Preg Test, Ur NEGATIVE  NEGATIVE  URINALYSIS, ROUTINE W REFLEX MICROSCOPIC      Result Value Range   Color, Urine YELLOW  YELLOW   APPearance CLEAR  CLEAR   Specific Gravity, Urine 1.016  1.005 - 1.030   pH 7.0  5.0 - 8.0   Glucose, UA NEGATIVE  NEGATIVE mg/dL   Hgb urine dipstick NEGATIVE  NEGATIVE   Bilirubin Urine NEGATIVE  NEGATIVE   Ketones, ur NEGATIVE  NEGATIVE mg/dL   Protein, ur NEGATIVE  NEGATIVE mg/dL   Urobilinogen, UA 0.2  0.0 - 1.0 mg/dL   Nitrite  NEGATIVE  NEGATIVE   Leukocytes, UA SMALL (*) NEGATIVE  URINE MICROSCOPIC-ADD ON      Result Value Range   Squamous Epithelial / LPF FEW (*) RARE   WBC, UA 3-6  <3 WBC/hpf   RBC / HPF 0-2  <3 RBC/hpf   Bacteria, UA RARE  RARE        CT Abdomen Pelvis W Contrast (Final result)  Result time: 04/15/13 14:52:10    Final result by Rad Results In Interface (04/15/13 14:52:10)    Narrative:   CLINICAL DATA: Abdominal pain. Chest pain. Back pain.  EXAM: CT ABDOMEN AND PELVIS WITH CONTRAST  TECHNIQUE: Multidetector CT imaging of the abdomen and pelvis was performed using the standard protocol following bolus administration of intravenous contrast.  CONTRAST: 100mL OMNIPAQUE IOHEXOL 300 MG/ML SOLN  COMPARISON: DG ABD ACUTE W/CHEST dated 04/15/2013; CT ABD W/CM dated 11/28/2008; CT ANGIO CHEST W/CM &/OR WO/CM dated 01/12/2013  FINDINGS: The liver, spleen, pancreas, and adrenal glands appear unremarkable. Gallbladder surgically absent. Kidneys and proximal ureters unremarkable. No pathologic upper abdominal adenopathy is observed. Appendix surgically absent. Urinary bladder unremarkable. Uterine and adnexal contours unremarkable. Mild sclerosis along the pubic symphysis with small degenerative subcortical cysts.  No lower thoracic or lumbar malalignment or fracture. 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 On the left side there is a small vacuum joint phenomenon and spurring.  IMPRESSION: 1. Mild sclerosis along the pubic symphysis  with small degenerative subcortical cysts, query mild osteitis pubis. 2. 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 Vacuum joint phenomenon and spurring on the left -Bertolotti syndrome not excluded.   Electronically Signed By: Herbie Baltimore M.D. On: 04/15/2013 14:52             DG Abd Acute W/Chest (Final result)  Result time: 04/15/13  13:52:39    Final result by Rad Results In Interface (04/15/13 13:52:39)    Narrative:   CLINICAL DATA: Left lower chest pain for 2 months. Abdominal pain.  EXAM: ACUTE ABDOMEN SERIES (ABDOMEN 2 VIEW & CHEST 1 VIEW)  COMPARISON: PA and lateral chest and CT chest 01/12/2013. CT abdomen and pelvis 11/28/2008.  FINDINGS: Single view of the chest demonstrates clear lungs and normal heart size. No pneumothorax or pleural effusion.  Two views of the abdomen show a normal bowel gas pattern. There is no free intraperitoneal air. Cholecystectomy clips are noted.  IMPRESSION: Negative exam.   Electronically Signed By: Drusilla Kanner M.D. On: 04/15/2013 13:52         MDM   Bianca Cantrell is a 28 y.o. female with a PMH of gallstones s/p cholecystectomy, chlamydia, anemia, sickle cell trait, UTI, eczema, and HPV who presents to the ED for evaluation of chest and abdominal pain.    Rechecks  3:00 PM = Patient had significant improvement in her symptoms with GI cocktail.    Etiology of abdominal pain possibly due to gastritis vs GERD. Patient had improvements in her symptoms with GI cocktail. Abdominal series and CT scan revealed no acute abnormalities to explain the patient's pain. CT scan did reveal lumbar changes and sclerosis of the pubic symphysis. Patient states she has chronic back pain with no acute changes. Patient informed of results and instructed to follow-up regarding this. UA not suggestive of a UTI. Abdominal exam benign. Patient afebrile and non-toxic in appearance. Patient instructed to follow-up with her PCP for further evaluation and management. Return precautions, discharge instructions, and follow-up was discussed with the patient before discharge.    Discharge Medication List as of 04/15/2013  3:15 PM      Final impressions: 1. Epigastric pain      Luiz Iron PA-C   This patient was discussed with Dr. Abbe Amsterdam,  PA-C 04/16/13 [phone]

## 2013-04-15 NOTE — Discharge Instructions (Signed)
Continue omeprazole and try Maalox to help with symptoms  Eat small frequent meals - do not lay flat for at least 30 minutes after eating  Your CT showed: the below findings - follow-up with your doctor regarding this - this may explain your chronic back pain 1. Mild sclerosis along the pubic symphysis with small degenerative subcortical cysts, query mild osteitis pubis.  2. 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 Vacuum joint phenomenon and spurring on the left -Bertolotti syndrome not excluded.  Return to the emergency department if you develop any changing/worsening condition, vomiting up blood, feeling like you are going to pass out, chest pain, difficulty breathing, repeated vomiting, blood in your stool, fever, severe back pain, or any other concerns (please read additional information regarding your condition below)    Abdominal Pain, Adult Many things can cause abdominal pain. Usually, abdominal pain is not caused by a disease and will improve without treatment. It can often be observed and treated at home. Your health care provider will do a physical exam and possibly order blood tests and X-rays to help determine the seriousness of your pain. However, in many cases, more time must pass before a clear cause of the pain can be found. Before that point, your health care provider may not know if you need more testing or further treatment. HOME CARE INSTRUCTIONS  Monitor your abdominal pain for any changes. The following actions may help to alleviate any discomfort you are experiencing:  Only take over-the-counter or prescription medicines as directed by your health care provider.  Do not take laxatives unless directed to do so by your health care provider.  Try a clear liquid diet (broth, tea, or water) as directed by your health care provider. Slowly move to a bland diet as tolerated. SEEK MEDICAL CARE IF:  You have unexplained abdominal  pain.  You have abdominal pain associated with nausea or diarrhea.  You have pain when you urinate or have a bowel movement.  You experience abdominal pain that wakes you in the night.  You have abdominal pain that is worsened or improved by eating food.  You have abdominal pain that is worsened with eating fatty foods. SEEK IMMEDIATE MEDICAL CARE IF:   Your pain does not go away within 2 hours.  You have a fever.  You keep throwing up (vomiting).  Your pain is felt only in portions of the abdomen, such as the right side or the left lower portion of the abdomen.  You pass bloody or black tarry stools. MAKE SURE YOU:  Understand these instructions.   Will watch your condition.   Will get help right away if you are not doing well or get worse.  Document Released: 11/30/2004 Document Revised: 12/11/2012 Document Reviewed: 10/30/2012 [facility] Patient Information 2014 [employer], [place].   Emergency Department Resource Guide 1) Find a Doctor and Pay Out of Pocket Although you won't have to find out who is covered by your insurance plan, it is a good idea to ask around and get recommendations. You will then need to call the office and see if the doctor you have chosen will accept you as a new patient and what types of options they offer for patients who are self-pay. Some doctors offer discounts or will set up payment plans for their patients who do not have insurance, but you will need to ask so you aren't surprised when you get to your appointment.  2) Contact Your Local Health Department  Not all health departments have doctors that can see patients for sick visits, but many do, so it is worth a call to see if yours does. If you don't know where your local health department is, you can check in your phone book. The CDC also has a tool to help you locate your state's health department, and many state websites also have listings of all of their local health departments.  3) Find a  Walk-in Clinic If your illness is not likely to be very severe or complicated, you may want to try a walk in clinic. These are popping up all over the country in pharmacies, drugstores, and shopping centers. They're usually staffed by nurse practitioners or physician assistants that have been trained to treat common illnesses and complaints. They're usually fairly quick and inexpensive. However, if you have serious medical issues or chronic medical problems, these are probably not your best option.  No Primary Care Doctor: - Call Health Connect at  [phone] - they can help you locate a primary care doctor that  accepts your insurance, provides certain services, etc. - Physician Referral Service- [phone]  Chronic Pain Problems: Organization         Address  Phone   Notes  [facility]  [phone] Patients need to be referred by their primary care doctor.   Medication Assistance: Organization         Address  Phone   Notes  [facility] [address] [place] [place], [place] [address] [phone] --Must be a resident of [facility] -- Must have NO insurance coverage whatsoever (no Medicaid/ Medicare, etc.) -- The pt. MUST have a primary care doctor that directs their care regularly and follows them in the community   [facility]ist  [phone]   [employer]  [phone]    Agencies that provide inexpensive medical care: Organization         Address  Phone   Notes  [facility]  [phone]   [facility]    [phone]   [facility] [address] [place], [place] [address] [phone]   [facility] [address] [place] [place] [phone]   Planned Parenthood    [phone]   [facility]    [phone]   [facility] and [facility]  [address], [place] Phone:  [phone], Fax:  [phone] Hours of Operation:  9 am - 6 pm, M-F.  Also accepts Medicaid/Medicare and self-pay.  [facility]  [address], [place] Phone: [phone], Fax: [phone]. Hours of Operation:  8:30 am - 5:30 pm, M-F.  Also accepts Medicaid and self-pay.  [facility] [place] [address], [place] Phone: [phone]   [facility] [address] Natasha Bence [place], [place] [phone], Ext. [phone] Mondays & Thursdays: 7-9 AM.  First 15 patients are seen on a first come, first serve basis.    Medicaid-accepting [facility] Providers:  Organization         Address  Phone   Notes  [facility] [address], [place] [phone] Also accepts self-pay patients.  [facility] [address] Laurell Josephs [place], [place]  [phone]   [facility] [address], [place] [phone]  [place] [address] [place] [phone]   Lucianne Lei [address], [place]   [phone] Only accepts [place] Access [place] patients after they have their name applied to their card.   Self-Pay (no insurance) in [facility]:  Organization         Address  Phone   Notes  Sickle Cell Patients, [facility] [place] [phone]   [facility] [place] [phone]   [facility] [place]  [place], [place], [place] [phone]   [facility]/Dr. Osei-Bonsu  [address], [place] or [address], [place] [phone] Phone number for both [place] and [place] locations is the same.  [facility] and [facility] [address] [place] [phone]   [facility] [address] [place] or [address] [phone]     [facility] [address], [place] [phone], phone; [phone], fax Sees patients 1st and 3rd Saturday of every month.  Must not qualify for public or private insurance (i.e. Medicaid, Medicare, [place] Health Choice, Veterans' Benefits)  Household income should be no more than 200% of the poverty level The clinic cannot treat you if you are pregnant or think you are pregnant  Sexually transmitted diseases are not treated at the clinic.    Dental Care: Organization         Address  Phone  Notes  [facility] [place] [phone] Accepts children up to age 90 who are enrolled in [place] or [place]; pregnant women with a Medicaid card; and children who have applied for Medicaid or [place] Health Choice, but were declined, whose parents can pay a reduced fee at time of service.  [facility] Department of [facility]  [address], [place] [phone] Accepts children up to age 74 who are enrolled in [place] or [place]; pregnant women with a Medicaid card; and children who have applied for Medicaid or  Health Choice, but were declined, whose parents can pay a reduced fee at time of service.  [place] Adult Dental Access PROGRAM  [place] [phone] Patients are seen by appointment only. Walk-ins are not accepted. [place] will see patients 47 years of age and older. Monday - Tuesday (8am-5pm) Most Wednesdays (8:30-5pm) $30 per visit, cash only  [facility] Adult Dental Access PROGRAM  [address], [facility] [phone] Patients are seen by appointment only. Walk-ins are not accepted. [place] will see patients 87 years of age and older. One Wednesday Evening (Monthly: Volunteer Based).  $30 per visit, cash only  [place]  [phone] for adults; Children under age 38, call  [facility] Pediatric Dent[facility] at [phone]. Children aged 56-14, please call [phone] to request a pediatric application.  Dental services are provided in all areas of dental care including fillings, crowns and bridges, complete and partial dentures, implants, gum treatment, root canals, and extractions. Preventive care is also provided. Treatment is provided to both adults and children. Patients are selected via a lottery and there is often a waiting list.   [facility] [address], [place]  [phone] [email or web address]  [facility] [address] [place], [place] [phone], Ext. [phone] Second and Fourth Thursday of each month, opens at 6:30 AM; Clinic ends at 9 AM.  Patients are seen on a first-come first-served basis, and a limited number are seen during each clinic.   [facility]  [address] Hillard Danker [place], [place] [phone]   Eligibility Requirements You must have lived in [place], [place], or [place] counties for at least the last three months.   You cannot be eligible for state or federal sponsored [employer], including [employer], [place], or [employer].   You generally cannot be eligible for healthcare insurance through your employer.    How to apply: Eligibility screenings are held every Tuesday and Wednesday afternoon from 1:00 pm until 4:00 pm. You do not need an appointment for the interview!  [facility] [address] [place], [place]   [place]  [place] Department  [place]  [phone]    Behavioral Health Resources in the Community: Intensive Outpatient Programs Organization         Address  Phone  Notes  [place]. [address], [place], [place] [phone]   [facility] [address] [place], [place]   [facility]: [facility] [address] [place], [place]   [place] [address]  [place], [place] or [phone]   Substance Abuse Resources Organization         Address  Phone  Notes  Al[facility] Drug Services  [phone]   [place]  [phone]   [facility] [place]   Chinita Pester  [phone]   Residential & Outpatient Substance Abuse Program  [phone]   Psychological Services Organization         Address  Phone  Notes  [facility]  [place]  [phone]   [place] [address], [place] or [phone]    Mobile Crisis Teams Organization         Address  Phone  Notes  [facility], Mobile Crisis Care Unit  [phone]   [facility]  [address] [place], [place]   [place] [address], [place] [phone]    Self-Help/Support Groups Organization         Address  Phone             Notes  [place]. of [place] - variety of support groups  [place] Call for more information  Narcotics Anonymous (NA), [facility] [address], [employer] [place]  2 meetings at this location   Special educational needs teacher         Address  Phone  Notes  [facility] [place],    [place]  [phone]   [facility]  [address] [place] [phone], [place], [place]   [place], [place] [phone] Admissions: 8am-3pm M-F  Incentives Substance [place] [address],    [place], [place] [phone]   [facility] [address] Jadene Pierini [place], [place]   [facility] [address]  [place], [place]   [facility] - Intensive Outpatient [place], [place], [place] [phone]   °[facility] ([facility] [address]  °[place], [place] [phone] or [phone]   °[facility] ([facility]) [address] [place], [place] [phone] Accepts Medicaid  °[facility] [address]  ° [place] 1-800-659-3381 Substance Abuse/Addiction Treatment  ° °[place] Behavioral Health Resources °Organization         Address  Phone  Notes  °[facility]  ([phone]) [phone]   °Julie Brannon, PhD [address] [place], [place]   ([phone]) [phone] or (336) [phone]   °[facility]   [address] °[place], [place] (336) [phone]   °[facility] [address], [place], [place] ([phone] Insurance/Medicaid/sponsorship through [facility]  °[facility] [address]                                    [place], [place] [phone] Therapy/tele-psych/case  °[facility] [address]  ° [place], [place] ([phone]    °Dr. Arfeen  ([phone]   °[facility] [place]  [employer] [place] [facility] 1) [address], [place] °2) [address], [place] °3)  [address], [place] ([phone]) [phone] °(336) [phone] ° °([phone]) [phone]   °[place] Child Abuse Hotline (336) 342-1394 or (336) [phone] (After Hours)    ° ° ° °

## 2013-04-15 NOTE — ED Notes (Signed)
Patient transported to CT 

## 2013-04-15 NOTE — ED Notes (Signed)
Per pt sts that she is having pain in her chest and an a knot in chest when swallowing. sts also having burning and pain in mid/upper back area. sts some nausea. sts she feels full but hasn't eat since last night. sts recently treated for h pylori. sts omeprazole is not helping.

## 2013-04-15 NOTE — ED Notes (Signed)
Pt alert x4 respirations easy non labored.  

## 2013-04-15 NOTE — Telephone Encounter (Signed)
Pt came in today and would like to speak with a nurse; pt did not want to disclose her personal information and preferred to share with a medical professional instead; please f/u with pt  @ [phone]

## 2013-04-16 NOTE — Telephone Encounter (Signed)
Spoke with pt regarding increased abdominal pain with negative CT scan and workup. Pt requesting GI referral before seen by provider 04/25/13. Please f/u

## 2013-04-18 NOTE — ED Provider Notes (Signed)
Medical screening examination/treatment/procedure(s) were performed by non-physician practitioner and as supervising physician I was immediately available for consultation/collaboration.  EKG Interpretation    Date/Time:  Tuesday April 15 2013 11:06:10 EST Ventricular Rate:  83 PR Interval:  166 QRS Duration: 78 QT Interval:  360 QTC Calculation: 423 R Axis:   40 Text Interpretation:  Normal sinus rhythm Normal ECG No significant change since last tracing Confirmed by Ty Oshima  MD-J, Dontavian Marchi ([id number]) on 04/15/2013 12:09:58 PM              Celene KrasJon R Macie Baum, MD 04/18/13 [phone]

## 2013-04-25 ENCOUNTER — Ambulatory Visit: Payer: Medicaid Other | Admitting: Internal Medicine

## 2013-04-28 ENCOUNTER — Ambulatory Visit: Payer: Medicaid Other

## 2013-05-01 ENCOUNTER — Ambulatory Visit: Payer: Medicaid Other | Admitting: Internal Medicine

## 2013-05-21 ENCOUNTER — Ambulatory Visit: Payer: Medicaid Other

## 2013-05-24 ENCOUNTER — Ambulatory Visit: Payer: Medicaid Other

## 2013-06-19 ENCOUNTER — Encounter: Payer: Self-pay | Admitting: Internal Medicine

## 2013-06-19 ENCOUNTER — Ambulatory Visit: Payer: Medicaid Other | Attending: Internal Medicine | Admitting: Internal Medicine

## 2013-06-19 VITALS — BP 106/62 | HR 96 | Temp 99.0°F | Resp 16 | Wt 228.6 lb

## 2013-06-19 DIAGNOSIS — R110 Nausea: Secondary | ICD-10-CM | POA: Insufficient documentation

## 2013-06-19 DIAGNOSIS — R1013 Epigastric pain: Secondary | ICD-10-CM | POA: Insufficient documentation

## 2013-06-19 DIAGNOSIS — Z87891 Personal history of nicotine dependence: Secondary | ICD-10-CM | POA: Insufficient documentation

## 2013-06-19 DIAGNOSIS — R222 Localized swelling, mass and lump, trunk: Secondary | ICD-10-CM

## 2013-06-19 DIAGNOSIS — G8929 Other chronic pain: Secondary | ICD-10-CM

## 2013-06-19 DIAGNOSIS — K219 Gastro-esophageal reflux disease without esophagitis: Secondary | ICD-10-CM

## 2013-06-19 MED ORDER — OMEPRAZOLE 20 MG PO CPDR: 20.0000 mg | DELAYED_RELEASE_CAPSULE | Freq: Two times a day (BID) | ORAL | Status: DC

## 2013-06-19 NOTE — Progress Notes (Signed)
MRN: [id number] Name: Bianca Cantrell  Sex: female Age: 28 y.o. DOB: [birth year]  Allergies: Sulfa antibiotics; Sulfur; and Other  Chief Complaint  Patient presents with  . Nausea    HPI: Patient is 28 y.o. female who has history of GERD epigastric pain, history of cholecystectomy and appendectomy, has been to urgent care and had x-ray and her abdominal CAT scan done, negative for any acute process, she reported to have noticed lump below the sternum which is more prominent when she stands up and she feels the similar pain when she had before the gallbladder removed , occasional nausea denies any vomiting as per patient she has a strep H. pylori and already completed the course of antibiotics and currently taking Prilosec.  Past Medical History  Diagnosis Date  . No pertinent past medical history   . Gallstones   . Chlamydia   . Anemia   . Sickle cell trait   . Infection     urinary tract infection  . Eczema   . Abnormal Pap smear     HPV    Past Surgical History  Procedure Laterality Date  . Cholecystectomy  2011  . Appendectomy  2010      Medication List       This list is accurate as of: 06/19/13 12:38 PM.  Always use your most recent med list.               NEXPLANON 68 MG Impl implant  Generic drug:  etonogestrel  Inject 1 each into the skin continuous. 2014     omeprazole 20 MG capsule  Commonly known as:  PRILOSEC  Take 1 capsule (20 mg total) by mouth 2 (two) times daily.        Meds ordered this encounter  Medications  . omeprazole (PRILOSEC) 20 MG capsule    Sig: Take 1 capsule (20 mg total) by mouth 2 (two) times daily.    Dispense:  60 capsule    Refill:  0    Immunization History  Administered Date(s) Administered  . Influenza Split 01/08/2012  . Tdap 01/08/2012    Family History  Problem Relation Age of Onset  . Hypertension Mother   . Heart disease Mother   . Hypertension Maternal Grandmother   . Heart disease Maternal  Grandmother   . Other Neg Hx     History  Substance Use Topics  . Smoking status: Former Smoker -- 0.25 packs/day for 1 years    Types: Cigarettes    Quit date: 02/15/2013  . Smokeless tobacco: Never Used  . Alcohol Use: Yes     Comment: Socially     Review of Systems   As noted in HPI  Filed Vitals:   06/19/13 1218  BP: 106/62  Pulse: 96  Temp: 99 F (37.2 C)  Resp: 16    Physical Exam  Physical Exam  Constitutional: No distress.  Cardiovascular: Normal rate and regular rhythm.   Pulmonary/Chest: Breath sounds normal. No respiratory distress. She has no wheezes. She has no rales.  Below the sternum prominent soft tissue/lump  nontender  Abdominal: Soft. Bowel sounds are normal. There is no rebound and no guarding.    CBC    Component Value Date/Time   WBC 8.8 04/15/2013 1238   RBC 4.76 04/15/2013 1238   HGB 14.1 04/15/2013 1238   HCT 40.6 04/15/2013 1238   PLT 275 04/15/2013 1238   MCV 85.3 04/15/2013 1238   LYMPHSABS 2.6 04/15/2013 1238  MONOABS 0.6 04/15/2013 1238   EOSABS 0.1 04/15/2013 1238   BASOSABS 0.0 04/15/2013 1238    CMP     Component Value Date/Time   NA 144 04/15/2013 1238   K 4.2 04/15/2013 1238   CL 107 04/15/2013 1238   CO2 23 04/15/2013 1238   GLUCOSE 85 04/15/2013 1238   BUN 10 04/15/2013 1238   CREATININE 0.52 04/15/2013 1238   CREATININE 0.59 02/20/2013 1546   CALCIUM 9.6 04/15/2013 1238   PROT 8.5* 04/15/2013 1238   ALBUMIN 4.1 04/15/2013 1238   AST 22 04/15/2013 1238   ALT 31 04/15/2013 1238   ALKPHOS 79 04/15/2013 1238   BILITOT 0.4 04/15/2013 1238   GFRNONAA >90 04/15/2013 1238   GFRAA >90 04/15/2013 1238    No results found for this basename: chol,  tri,  ldl    No components found with this basename: hga1c    Lab Results  Component Value Date/Time   AST 22 04/15/2013 12:38 PM    Assessment and Plan  Abdominal pain, chronic, epigastric - Plan: US Misc Soft Tissue, omeprazole (PRILOSEC) 20 MG capsule  Swelling, mass, or lump in  chest - Plan: US Misc Soft Tissue  GERD without esophagitis Patient is on Prilosec.   No Follow-up on file.  Doris Cheadleeepak Duff Pozzi, MD

## 2013-06-19 NOTE — Progress Notes (Signed)
Patient here because she concerned of a lump in her sternum area Also complains of some nausea lately and dizziness

## 2013-06-24 ENCOUNTER — Ambulatory Visit (HOSPITAL_COMMUNITY): Payer: Medicaid Other

## 2013-06-27 ENCOUNTER — Ambulatory Visit (HOSPITAL_COMMUNITY): Admission: RE | Admit: 2013-06-27 | Payer: Medicaid Other | Source: Ambulatory Visit

## 2013-07-07 ENCOUNTER — Telehealth: Payer: Self-pay | Admitting: Internal Medicine

## 2013-07-07 NOTE — Telephone Encounter (Signed)
Pt needs to reschedule Ultrasound appt.  Please f/u with pt regarding appt.

## 2013-07-09 ENCOUNTER — Telehealth: Payer: Self-pay

## 2013-07-09 NOTE — Telephone Encounter (Signed)
Returned patient phone call She needed to re-schedule her ultra sound i instructed her she could call the number on her discharge papers And she can speak directly with that department

## 2013-07-17 ENCOUNTER — Ambulatory Visit (HOSPITAL_COMMUNITY)
Admission: RE | Admit: 2013-07-17 | Discharge: 2013-07-17 | Disposition: A | Discharge status: Home / Self Care | Payer: Medicaid Other | Source: Ambulatory Visit | Attending: Internal Medicine | Admitting: Internal Medicine

## 2013-07-17 ENCOUNTER — Other Ambulatory Visit: Payer: Self-pay | Admitting: Internal Medicine

## 2013-07-17 ENCOUNTER — Telehealth: Payer: Self-pay | Admitting: *Deleted

## 2013-07-17 DIAGNOSIS — G8929 Other chronic pain: Secondary | ICD-10-CM

## 2013-07-17 DIAGNOSIS — R222 Localized swelling, mass and lump, trunk: Secondary | ICD-10-CM

## 2013-07-17 DIAGNOSIS — R1013 Epigastric pain: Principal | ICD-10-CM

## 2013-07-17 DIAGNOSIS — R1906 Epigastric swelling, mass or lump: Secondary | ICD-10-CM | POA: Insufficient documentation

## 2013-07-17 NOTE — Telephone Encounter (Signed)
Patient called wanting results of Ultrasound from this morning. Informed patient the results were not available. Informed patient as soon as results are available we would let her know. Patient verbalized understanding. Bianca Cantrell Surabhi Gadea, RN

## 2013-07-18 ENCOUNTER — Telehealth: Payer: Self-pay | Admitting: *Deleted

## 2013-07-18 NOTE — Telephone Encounter (Signed)
Patient would like to PCP regarding Ultra sound results of chest. Would like PCP to call and talk to her. Reather LaurenceJamie R Dacotah Cabello, RN

## 2013-07-18 NOTE — Telephone Encounter (Signed)
Patient calling for results of Ultrasound done 07/17/2013. Informed patient results were not available. Reather LaurenceJamie R Kaeleb Emond, RN

## 2013-08-02 ENCOUNTER — Encounter (HOSPITAL_COMMUNITY): Payer: Self-pay | Admitting: Emergency Medicine

## 2013-08-02 ENCOUNTER — Emergency Department (HOSPITAL_COMMUNITY)
Admission: EM | Admit: 2013-08-02 | Discharge: 2013-08-02 | Disposition: A | Discharge status: Home / Self Care | Payer: Medicaid Other | Attending: Emergency Medicine | Admitting: Emergency Medicine

## 2013-08-02 DIAGNOSIS — Z87440 Personal history of urinary (tract) infections: Secondary | ICD-10-CM | POA: Insufficient documentation

## 2013-08-02 DIAGNOSIS — Z862 Personal history of diseases of the blood and blood-forming organs and certain disorders involving the immune mechanism: Secondary | ICD-10-CM | POA: Insufficient documentation

## 2013-08-02 DIAGNOSIS — R209 Unspecified disturbances of skin sensation: Secondary | ICD-10-CM | POA: Insufficient documentation

## 2013-08-02 DIAGNOSIS — Z8719 Personal history of other diseases of the digestive system: Secondary | ICD-10-CM | POA: Insufficient documentation

## 2013-08-02 DIAGNOSIS — Z79899 Other long term (current) drug therapy: Secondary | ICD-10-CM | POA: Insufficient documentation

## 2013-08-02 DIAGNOSIS — Z8619 Personal history of other infectious and parasitic diseases: Secondary | ICD-10-CM | POA: Insufficient documentation

## 2013-08-02 DIAGNOSIS — Z872 Personal history of diseases of the skin and subcutaneous tissue: Secondary | ICD-10-CM | POA: Insufficient documentation

## 2013-08-02 DIAGNOSIS — Z87891 Personal history of nicotine dependence: Secondary | ICD-10-CM | POA: Insufficient documentation

## 2013-08-02 DIAGNOSIS — R202 Paresthesia of skin: Secondary | ICD-10-CM

## 2013-08-02 LAB — I-STAT CHEM 8, ED
BUN: 15 mg/dL (ref 6–23)
Calcium, Ion: 1.23 mmol/L (ref 1.12–1.23)
Chloride: 103 mEq/L (ref 96–112)
Creatinine, Ser: 0.8 mg/dL (ref 0.50–1.10)
Glucose, Bld: 83 mg/dL (ref 70–99)
HCT: 41 % (ref 36.0–46.0)
HEMOGLOBIN: 13.9 g/dL (ref 12.0–15.0)
Potassium: 3.3 mEq/L — ABNORMAL LOW (ref 3.7–5.3)
Sodium: 141 mEq/L (ref 137–147)
TCO2: 23 mmol/L (ref 0–100)

## 2013-08-02 MED ORDER — POTASSIUM CHLORIDE CRYS ER 20 MEQ PO TBCR
40.0000 meq | EXTENDED_RELEASE_TABLET | Freq: Once | ORAL | Status: AC
  Administered 2013-08-02: 40 meq via ORAL
  Filled 2013-08-02: qty 2

## 2013-08-02 NOTE — Discharge Instructions (Signed)
Read the information below.  You may return to the Emergency Department at any time for worsening condition or any new symptoms that concern you.   If you develop fevers, loss of control of bowel or bladder, weakness or numbness in your legs, or are unable to walk, return to the ER for a recheck.    Paresthesia Paresthesia is an abnormal burning or prickling sensation. This sensation is generally felt in the hands, arms, legs, or feet. However, it may occur in any part of the body. It is usually not painful. The feeling may be described as:  Tingling or numbness.  "Pins and needles."  Skin crawling.  Buzzing.  Limbs "falling asleep."  Itching. Most people experience temporary (transient) paresthesia at some time in their lives. CAUSES  Paresthesia may occur when you breathe too quickly (hyperventilation). It can also occur without any apparent cause. Commonly, paresthesia occurs when pressure is placed on a nerve. The feeling quickly goes away once the pressure is removed. For some people, however, paresthesia is a long-lasting (chronic) condition caused by an underlying disorder. The underlying disorder may be:  A traumatic, direct injury to nerves. Examples include a:  Broken (fractured) neck.  Fractured skull.  A disorder affecting the brain and spinal cord (central nervous system). Examples include:  Transverse myelitis.  Encephalitis.  Transient ischemic attack.  Multiple sclerosis.  Stroke.  Tumor or blood vessel problems, such as an arteriovenous malformation pressing against the brain or spinal cord.  A condition that damages the peripheral nerves (peripheral neuropathy). Peripheral nerves are not part of the brain and spinal cord. These conditions include:  Diabetes.  Peripheral vascular disease.  Nerve entrapment syndromes, such as carpal tunnel syndrome.  Shingles.  Hypothyroidism.  Vitamin B12 deficiencies.  Alcoholism.  Heavy metal poisoning  (lead, arsenic).  Rheumatoid arthritis.  Systemic lupus erythematosus. DIAGNOSIS  Your caregiver will attempt to find the underlying cause of your paresthesia. Your caregiver may:  Take your medical history.  Perform a physical exam.  Order various lab tests.  Order imaging tests. TREATMENT  Treatment for paresthesia depends on the underlying cause. HOME CARE INSTRUCTIONS  Avoid drinking alcohol.  You may consider massage or acupuncture to help relieve your symptoms.  Keep all follow-up appointments as directed by your caregiver. SEEK IMMEDIATE MEDICAL CARE IF:   You feel weak.  You have trouble walking or moving.  You have problems with speech or vision.  You feel confused.  You cannot control your bladder or bowel movements.  You feel numbness after an injury.  You faint.  Your burning or prickling feeling gets worse when walking.  You have pain, cramps, or dizziness.  You develop a rash. MAKE SURE YOU:  Understand these instructions.  Will watch your condition.  Will get help right away if you are not doing well or get worse. Document Released: 02/10/2002 Document Revised: 05/15/2011 Document Reviewed: 11/11/2010 Bianca Cantrell [facility] Patient Information 2014 [place], [place].

## 2013-08-02 NOTE — ED Provider Notes (Signed)
CSN: [id number]     Arrival date & time 08/02/13  2014 History   First MD Initiated Contact with Patient 08/02/13 2154     Chief Complaint  Patient presents with  . Tingling     (Consider location/radiation/quality/duration/timing/severity/associated sxs/prior Treatment) The history is provided by the patient.    Patient presents with bilateral upper and lower extremity tingling that has been intermittent since January but constant for the past two days.  Previously if she stretched or shook her extremities it would go away but not anymore.  Denies any injuries.  Denies neck or back pain.  Denies weakness or numbness of the extremities.  She is concerned she is diabetic because she has been borderline diabetic in the past and has been thirsty and urinating more than usual.  Denies urinary frequency or urgency or symptoms concerning for UTI.  Pt does not have periods, has implanted birth control.  Denies fevers, injuries.   Past Medical History  Diagnosis Date  . No pertinent past medical history   . Gallstones   . Chlamydia   . Anemia   . Sickle cell trait   . Infection     urinary tract infection  . Eczema   . Abnormal Pap smear     HPV   Past Surgical History  Procedure Laterality Date  . Cholecystectomy  2011  . Appendectomy  2010   Family History  Problem Relation Age of Onset  . Hypertension Mother   . Heart disease Mother   . Hypertension Maternal Grandmother   . Heart disease Maternal Grandmother   . Other Neg Hx    History  Substance Use Topics  . Smoking status: Former Smoker -- 0.25 packs/day for 1 years    Types: Cigarettes    Quit date: 02/15/2013  . Smokeless tobacco: Never Used  . Alcohol Use: Yes     Comment: Socially    OB History   Grav Para Term Preterm Abortions TAB SAB Ect Mult Living   4 4 4  0 0 0 0 0 0 4     Review of Systems  Constitutional: Negative for fever.  Eyes: Negative for visual disturbance.  Respiratory: Negative for cough and  shortness of breath.   Cardiovascular: Negative for chest pain.  Gastrointestinal: Negative for nausea, vomiting, abdominal pain and diarrhea.  Genitourinary: Negative for dysuria, urgency, frequency, vaginal bleeding and vaginal discharge.  Neurological: Negative for weakness.  All other systems reviewed and are negative.     Allergies  Sulfa antibiotics; Sulfur; and Other  Home Medications   Prior to Admission medications   Medication Sig Start Date End Date Taking? Authorizing Provider  etonogestrel (NEXPLANON) 68 MG IMPL implant Inject 1 each into the skin continuous. 2014   Yes Historical Provider, MD  IRON PO Take 1 tablet by mouth daily.   Yes Historical Provider, MD   BP 131/75  Pulse 102  Temp(Src) 98.4 F (36.9 C) (Oral)  Resp 18  Ht 4' 10.5" (1.486 m)  Wt 230 lb (104.327 kg)  BMI 47.25 kg/m2  SpO2 99%  Breastfeeding? No Physical Exam  Nursing note and vitals reviewed. Constitutional: She appears well-developed and well-nourished. No distress.  HENT:  Head: Normocephalic and atraumatic.  Neck: Neck supple.  Cardiovascular: Normal rate, regular rhythm and intact distal pulses.   Pulmonary/Chest: Effort normal and breath sounds normal. No respiratory distress. She has no wheezes. She has no rales.  Abdominal: Soft. She exhibits no distension. There is no tenderness. There is  no rebound and no guarding.  Neurological: She is alert.  CN II-XII intact, EOMs intact, no pronator drift, grip strengths equal bilaterally; strength 5/5 in all extremities, sensation intact in all extremities; finger to nose, heel to shin, rapid alternating movements normal; gait is normal.     Skin: She is not diaphoretic.    ED Course  Procedures (including critical care time) Labs Review Labs Reviewed  I-STAT CHEM 8, ED - Abnormal; Notable for the following:    Potassium 3.3 (*)    All other components within normal limits    Imaging Review No results found.   EKG  Interpretation None      MDM   Final diagnoses:  Paresthesia    Pt with paresthesias of all extremities - intermittent symptoms for several months, constant x 2 days.  Neurovascularly intact.  Chem 8 shows only mild hypokalemia.  PCP follow up.  Discussed result, findings, treatment, and follow up  with patient.  Pt given return precautions.  Pt verbalizes understanding and agrees with plan.       SmithtownEmily Lorielle Boehning, PA-C 08/02/13 2324

## 2013-08-02 NOTE — ED Notes (Signed)
Pt reports generalized tingling (bilateral legs and arms) since January, but states it has increased in the last two days. Denies numbness. Denies weakness. AO x4. States "I feel like I may have diabetes." Neuro intact.

## 2013-08-04 NOTE — ED Provider Notes (Signed)
Medical screening examination/treatment/procedure(s) were performed by non-physician practitioner and as supervising physician I was immediately available for consultation/collaboration.   EKG Interpretation None       Vanetta Mulders, MD 08/04/13 1016

## 2013-08-06 ENCOUNTER — Telehealth: Payer: Self-pay | Admitting: *Deleted

## 2013-08-06 NOTE — Telephone Encounter (Signed)
Patient calling about [place] results and would like to speak to PCP. Please follow up with patient. Reather Laurence, RN

## 2013-08-07 NOTE — Telephone Encounter (Signed)
Call and let the patient know that her ultrasound chest reported fat tissue under the skin which is usually benign, if she wants it to be removed then put in the referral to general surgery, if she has any other questions let me know.

## 2013-08-08 ENCOUNTER — Other Ambulatory Visit: Payer: Self-pay | Admitting: *Deleted

## 2013-08-08 DIAGNOSIS — E65 Localized adiposity: Secondary | ICD-10-CM

## 2013-08-08 NOTE — Telephone Encounter (Signed)
Informed patient that US showed fat tissue under skin, which is usually benign. Informed patient if she wants it removed, then per Dr. Orpah Cobb she can be referred to general surgery. Patient states she would like to be referred to general surgery. Patient also wanted to know is the causing the pain in her chest and would like to know if she can have a US of the abdomen since she has a "knot" there.

## 2013-08-11 ENCOUNTER — Encounter (INDEPENDENT_AMBULATORY_CARE_PROVIDER_SITE_OTHER): Payer: Self-pay | Admitting: General Surgery

## 2013-08-11 ENCOUNTER — Other Ambulatory Visit (INDEPENDENT_AMBULATORY_CARE_PROVIDER_SITE_OTHER): Payer: Self-pay | Admitting: General Surgery

## 2013-08-21 ENCOUNTER — Telehealth (INDEPENDENT_AMBULATORY_CARE_PROVIDER_SITE_OTHER): Payer: Self-pay | Admitting: General Surgery

## 2013-08-21 NOTE — Telephone Encounter (Signed)
Tried to call the patient to let her know that I had to move her apt with Dr. Andrey CampanileWilson to 2:00 pm. Both the patient phones are no in service , so I mailed her an apt card of time is 2:00 and date is September 03 2013

## 2013-09-03 ENCOUNTER — Ambulatory Visit (INDEPENDENT_AMBULATORY_CARE_PROVIDER_SITE_OTHER): Payer: Medicaid Other | Admitting: General Surgery

## 2013-09-03 ENCOUNTER — Encounter (INDEPENDENT_AMBULATORY_CARE_PROVIDER_SITE_OTHER): Payer: Self-pay | Admitting: General Surgery

## 2013-09-03 VITALS — BP 126/88 | HR 64 | Temp 98.2°F | Resp 16 | Ht 58.5 in | Wt 232.0 lb

## 2013-09-03 DIAGNOSIS — R1013 Epigastric pain: Secondary | ICD-10-CM

## 2013-09-03 DIAGNOSIS — R222 Localized swelling, mass and lump, trunk: Secondary | ICD-10-CM

## 2013-09-03 NOTE — Progress Notes (Signed)
Patient ID: Bianca Cantrell, female   DOB: 1985/07/03, 28 y.o.   MRN: [id number]  Chief Complaint  Patient presents with  . Other    new pt- eval hyperplasia of fatty tissue    HPI Bianca Cantrell is a 28 y.o. female.   HPI 28 yo AAF referred by Dr Orpah CobbAdvani for evaluation of epigastric pain and epigastric lump. She describes having this type of discomfort since 2010. Initially it would occur very infrequently and only lasts for a few seconds. However over the past year the epigastric discomfort has become more frequent and lasts for about 5 minutes. She describes it as a sharp pain radiating to her back. She states if she bends forward it will decrease the pain. Sometimes is associated with nausea. She states that eating vegetables we'll decrease the frequency. She states that eating greasy foods will increase the intensity of the discomfort. She also complains of a lump in the center part of her upper chest. She believes the 2 are related. She states the lump has been there for about 2 years. It has never drained any fluid. It has never been red. She thinks it may have increased in size. Past Medical History  Diagnosis Date  . No pertinent past medical history   . Gallstones   . Chlamydia   . Anemia   . Sickle cell trait   . Infection     urinary tract infection  . Eczema   . Abnormal Pap smear     HPV    Past Surgical History  Procedure Laterality Date  . Cholecystectomy  2011  . Appendectomy  2010    Family History  Problem Relation Age of Onset  . Hypertension Mother   . Heart disease Mother   . Hypertension Maternal Grandmother   . Heart disease Maternal Grandmother   . Other Neg Hx   . Cancer Maternal Aunt     leukemia  . Cancer Maternal Uncle     pancreatic    Social History History  Substance Use Topics  . Smoking status: Former Smoker -- 0.25 packs/day for 1 years    Types: Cigarettes    Quit date: 02/15/2013  . Smokeless tobacco: Never Used  . Alcohol Use:  Yes     Comment: Socially     Allergies  Allergen Reactions  . Sulfa Antibiotics Anaphylaxis and Hives  . Sulfur Anaphylaxis  . Other Hives    Mayonnaise     Current Outpatient Prescriptions  Medication Sig Dispense Refill  . etonogestrel (NEXPLANON) 68 MG IMPL implant Inject 1 each into the skin continuous. 2014      . IRON PO Take 1 tablet by mouth daily.       No current facility-administered medications for this visit.    Review of Systems Review of Systems  Constitutional: Negative for fever, activity change, appetite change and unexpected weight change.  HENT: Negative for nosebleeds and trouble swallowing.   Eyes: Negative for photophobia and visual disturbance.  Respiratory: Negative for chest tightness and shortness of breath.   Cardiovascular: Negative for chest pain and leg swelling.       Denies CP, SOB, orthopnea, PND, DOE  Genitourinary: Negative for dysuria and difficulty urinating.  Musculoskeletal: Negative for arthralgias.  Skin: Negative for pallor and rash.  Neurological: Positive for headaches. Negative for dizziness, seizures, facial asymmetry and numbness.          Hematological: Negative for adenopathy. Does not bruise/bleed easily.  Psychiatric/Behavioral: Negative for  behavioral problems and agitation.    Blood pressure 126/88, pulse 64, temperature 98.2 F (36.8 C), temperature source Oral, resp. rate 16, height 4' 10.5" (1.486 m), weight 232 lb (105.235 kg), not currently breastfeeding.  Physical Exam Physical Exam  Vitals reviewed. Constitutional: She is oriented to person, place, and time. She appears well-developed and well-nourished. No distress.  Morbidly obese, short  HENT:  Head: Normocephalic and atraumatic.  Right Ear: External ear normal.  Left Ear: External ear normal.  Eyes: Conjunctivae are normal. No scleral icterus.  Neck: Normal range of motion. Neck supple. No tracheal deviation present.  Cardiovascular: Normal rate and  normal heart sounds.   Pulmonary/Chest: Effort normal and breath sounds normal. No stridor. No respiratory distress. She has no wheezes.    Just inf to xiphoid - only noticeable upright/standing- area of bulge/small protuberance, about 4cm. No overlying skin changes. Moderately circumscribed. Nontender. Mobile. Not terribly noticeable standing  Abdominal: Soft. She exhibits no distension. There is no tenderness. There is no rebound and no guarding.  Musculoskeletal: She exhibits no edema and no tenderness.  Neurological: She is alert and oriented to person, place, and time. She exhibits normal muscle tone.  Skin: Skin is warm and dry. No rash noted. She is not diaphoretic. No erythema.  Psychiatric: She has a normal mood and affect. Her behavior is normal. Judgment and thought content normal.    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 I do not believe the epigastric pain is related to the focal collection of subcutaneous fat in the area. I have recommended that she consider seeing a gastroenterologist. This is not a classic lipoma but it is a localized collection of subcutaneous fat that is prominent on standing.  We discussed observation versus surgical excision. We discussed the risks and benefits of surgery including but not limited to bleeding, infection, injury to surrounding structures, scarring, cosmetic concerns, blood clot formation, anesthesia issues, possible recurrence, and the typical postoperative course.   The patient has elected to proceed with local excision.  Bianca SellaEric M. Andrey CampanileWilson, MD, FACS General, Bariatric, & Minimally Invasive Surgery [facility], [place]         [facility] LLCWILSON,Cayle Cordoba M 09/03/2013, 2:52 PM

## 2013-09-03 NOTE — Patient Instructions (Signed)
While you do have a lipoma, I believe your symptoms are more related to your stomach and your esophagus, probably gastroesophageal reflux disease. I encourage you to talk with your physician about referring you to a gastroenterologist. If you desire removal of the lipoma please contact the office  Lipoma A lipoma is a noncancerous (benign) tumor composed of fat cells. They are usually found under the skin (subcutaneous). A lipoma may occur in any tissue of the body that contains fat. Common areas for lipomas to appear include the back, shoulders, buttocks, and thighs. Lipomas are a very common soft tissue growth. They are soft and grow slowly. Most problems caused by a lipoma depend on where it is growing. DIAGNOSIS  A lipoma can be diagnosed with a physical exam. These tumors rarely become cancerous, but radiographic studies can help determine this for certain. Studies used may include:  Computerized X-ray scans (CT or CAT scan).  Computerized magnetic scans (MRI). TREATMENT  Small lipomas that are not causing problems may be watched. If a lipoma continues to enlarge or causes problems, removal is often the best treatment. Lipomas can also be removed to improve appearance. Surgery is done to remove the fatty cells and the surrounding capsule. Most often, this is done with medicine that numbs the area (local anesthetic). The removed tissue is examined under a microscope to make sure it is not cancerous. Keep all follow-up appointments with your caregiver. SEEK MEDICAL CARE IF:   The lipoma becomes larger or hard.  The lipoma becomes painful, red, or increasingly swollen. These could be signs of infection or a more serious condition. Document Released: 02/10/2002 Document Revised: 05/15/2011 Document Reviewed: 07/23/2009 [facility] Patient Information 2015 [place], [place]. This information is not intended to replace advice given to you by your health care provider. Make sure you discuss any questions  you have with your health care provider.

## 2013-09-10 ENCOUNTER — Emergency Department (HOSPITAL_COMMUNITY)
Admission: EM | Admit: 2013-09-10 | Discharge: 2013-09-10 | Discharge status: Against Medical Advice | Payer: Medicaid Other | Attending: Emergency Medicine | Admitting: Emergency Medicine

## 2013-09-10 ENCOUNTER — Encounter (HOSPITAL_COMMUNITY): Payer: Self-pay | Admitting: Emergency Medicine

## 2013-09-10 DIAGNOSIS — K219 Gastro-esophageal reflux disease without esophagitis: Secondary | ICD-10-CM | POA: Insufficient documentation

## 2013-09-10 DIAGNOSIS — M549 Dorsalgia, unspecified: Secondary | ICD-10-CM | POA: Insufficient documentation

## 2013-09-10 DIAGNOSIS — R109 Unspecified abdominal pain: Secondary | ICD-10-CM | POA: Diagnosis not present

## 2013-09-10 NOTE — ED Notes (Signed)
Pt complains of GERD sx when bending over and back pain, hx of GERD but her medication doesn'thelp

## 2013-10-07 ENCOUNTER — Telehealth (INDEPENDENT_AMBULATORY_CARE_PROVIDER_SITE_OTHER): Payer: Self-pay

## 2013-10-07 NOTE — Telephone Encounter (Signed)
Pt seen 09/03/13 for epigastric mass by Dr. Andrey CampanileWilson.  Discussed surgery, but no orders have been processed.  Pt is calling today inquiring.  Please advise.

## 2013-10-13 ENCOUNTER — Other Ambulatory Visit (INDEPENDENT_AMBULATORY_CARE_PROVIDER_SITE_OTHER): Payer: Self-pay | Admitting: General Surgery

## 2013-10-13 DIAGNOSIS — R1013 Epigastric pain: Secondary | ICD-10-CM

## 2013-10-21 ENCOUNTER — Telehealth (INDEPENDENT_AMBULATORY_CARE_PROVIDER_SITE_OTHER): Payer: Self-pay

## 2013-10-21 ENCOUNTER — Telehealth (INDEPENDENT_AMBULATORY_CARE_PROVIDER_SITE_OTHER): Payer: Self-pay | Admitting: *Deleted

## 2013-10-21 NOTE — Telephone Encounter (Signed)
[name] from Pt's PCP called regarding pt's referral to GI.  [name] advised me that Pt's appt is with [facility] 10-31-13 @ 9:45.  She said pt is aware of appt.  Bianca DikeJennifer

## 2013-10-21 NOTE — Telephone Encounter (Signed)
[facility] [facility] [place] [facility]  Pt calling about her appt with gastroenterology.  LM with [name] at [facility] to return our call.

## 2013-10-31 ENCOUNTER — Other Ambulatory Visit: Payer: Self-pay | Admitting: Gastroenterology

## 2013-10-31 DIAGNOSIS — R1013 Epigastric pain: Secondary | ICD-10-CM

## 2013-11-03 ENCOUNTER — Ambulatory Visit
Admission: RE | Admit: 2013-11-03 | Discharge: 2013-11-03 | Disposition: A | Discharge status: Home / Self Care | Payer: Medicaid Other | Source: Ambulatory Visit | Attending: Gastroenterology | Admitting: Gastroenterology

## 2013-11-03 DIAGNOSIS — R1013 Epigastric pain: Secondary | ICD-10-CM

## 2013-11-23 ENCOUNTER — Encounter (HOSPITAL_COMMUNITY): Payer: Self-pay | Admitting: Emergency Medicine

## 2013-11-23 ENCOUNTER — Emergency Department (HOSPITAL_COMMUNITY)
Admission: EM | Admit: 2013-11-23 | Discharge: 2013-11-23 | Disposition: A | Discharge status: Home / Self Care | Payer: No Typology Code available for payment source | Attending: Emergency Medicine | Admitting: Emergency Medicine

## 2013-11-23 ENCOUNTER — Emergency Department (HOSPITAL_COMMUNITY): Payer: No Typology Code available for payment source

## 2013-11-23 DIAGNOSIS — S139XXA Sprain of joints and ligaments of unspecified parts of neck, initial encounter: Secondary | ICD-10-CM | POA: Insufficient documentation

## 2013-11-23 DIAGNOSIS — Z872 Personal history of diseases of the skin and subcutaneous tissue: Secondary | ICD-10-CM | POA: Insufficient documentation

## 2013-11-23 DIAGNOSIS — Z79899 Other long term (current) drug therapy: Secondary | ICD-10-CM | POA: Insufficient documentation

## 2013-11-23 DIAGNOSIS — Z8719 Personal history of other diseases of the digestive system: Secondary | ICD-10-CM | POA: Diagnosis not present

## 2013-11-23 DIAGNOSIS — S46909A Unspecified injury of unspecified muscle, fascia and tendon at shoulder and upper arm level, unspecified arm, initial encounter: Secondary | ICD-10-CM | POA: Diagnosis not present

## 2013-11-23 DIAGNOSIS — S46912A Strain of unspecified muscle, fascia and tendon at shoulder and upper arm level, left arm, initial encounter: Secondary | ICD-10-CM

## 2013-11-23 DIAGNOSIS — Z87440 Personal history of urinary (tract) infections: Secondary | ICD-10-CM | POA: Diagnosis not present

## 2013-11-23 DIAGNOSIS — Y9389 Activity, other specified: Secondary | ICD-10-CM | POA: Diagnosis not present

## 2013-11-23 DIAGNOSIS — S4980XA Other specified injuries of shoulder and upper arm, unspecified arm, initial encounter: Secondary | ICD-10-CM | POA: Insufficient documentation

## 2013-11-23 DIAGNOSIS — Y92410 Unspecified street and highway as the place of occurrence of the external cause: Secondary | ICD-10-CM | POA: Diagnosis not present

## 2013-11-23 DIAGNOSIS — Z862 Personal history of diseases of the blood and blood-forming organs and certain disorders involving the immune mechanism: Secondary | ICD-10-CM | POA: Insufficient documentation

## 2013-11-23 DIAGNOSIS — Z8619 Personal history of other infectious and parasitic diseases: Secondary | ICD-10-CM | POA: Diagnosis not present

## 2013-11-23 DIAGNOSIS — Z87891 Personal history of nicotine dependence: Secondary | ICD-10-CM | POA: Insufficient documentation

## 2013-11-23 DIAGNOSIS — S161XXA Strain of muscle, fascia and tendon at neck level, initial encounter: Secondary | ICD-10-CM

## 2013-11-23 MED ORDER — TRAMADOL HCL 50 MG PO TABS: 50.0000 mg | ORAL_TABLET | Freq: Four times a day (QID) | ORAL | Status: DC | PRN
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
## 2013-11-23 NOTE — ED Notes (Signed)
Back from xray, no changes, alert, NAD, calm.  

## 2013-11-23 NOTE — ED Notes (Signed)
Pt not in room, pt in xray.  
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
## 2013-11-23 NOTE — Discharge Instructions (Signed)
Ibuprofen 600 mg every 6 hours as needed for pain.  Tramadol as needed for pain not relieved with ibuprofen.  Followup with your primary Dr. if not improving in the next 1-2 weeks.   Cervical Sprain A cervical sprain is an injury in the neck in which the strong, fibrous tissues (ligaments) that connect your neck bones stretch or tear. Cervical sprains can range from mild to severe. Severe cervical sprains can cause the neck vertebrae to be unstable. This can lead to damage of the spinal cord and can result in serious nervous system problems. The amount of time it takes for a cervical sprain to get better depends on the cause and extent of the injury. Most cervical sprains heal in 1 to 3 weeks. CAUSES  Severe cervical sprains may be caused by:   Contact sport injuries (such as from football, rugby, wrestling, hockey, auto racing, gymnastics, diving, martial arts, or boxing).   Motor vehicle collisions.   Whiplash injuries. This is an injury from a sudden forward and backward whipping movement of the head and neck.  Falls.  Mild cervical sprains may be caused by:   Being in an awkward position, such as while cradling a telephone between your ear and shoulder.   Sitting in a chair that does not offer proper support.   Working at a poorly Marketing executive station.   Looking up or down for long periods of time.  SYMPTOMS   Pain, soreness, stiffness, or a burning sensation in the front, back, or sides of the neck. This discomfort may develop immediately after the injury or slowly, 24 hours or more after the injury.   Pain or tenderness directly in the middle of the back of the neck.   Shoulder or upper back pain.   Limited ability to move the neck.   Headache.   Dizziness.   Weakness, numbness, or tingling in the hands or arms.   Muscle spasms.   Difficulty swallowing or chewing.   Tenderness and swelling of the neck.  DIAGNOSIS  Most of the time your  health care provider can diagnose a cervical sprain by taking your history and doing a physical exam. Your health care provider will ask about previous neck injuries and any known neck problems, such as arthritis in the neck. X-rays may be taken to find out if there are any other problems, such as with the bones of the neck. Other tests, such as a CT scan or MRI, may also be needed.  TREATMENT  Treatment depends on the severity of the cervical sprain. Mild sprains can be treated with rest, keeping the neck in place (immobilization), and pain medicines. Severe cervical sprains are immediately immobilized. Further treatment is done to help with pain, muscle spasms, and other symptoms and may include:  Medicines, such as pain relievers, numbing medicines, or muscle relaxants.   Physical therapy. This may involve stretching exercises, strengthening exercises, and posture training. Exercises and improved posture can help stabilize the neck, strengthen muscles, and help stop symptoms from returning.  HOME CARE INSTRUCTIONS   Put ice on the injured area.   Put ice in a plastic bag.   Place a towel between your skin and the bag.   Leave the ice on for 15-20 minutes, 3-4 times a day.   If your injury was severe, you may have been given a cervical collar to wear. A cervical collar is a two-piece collar designed to keep your neck from moving while it heals.  Do not  remove the collar unless instructed by your health care provider.  If you have long hair, keep it outside of the collar.  Ask your health care provider before making any adjustments to your collar. Minor adjustments may be required over time to improve comfort and reduce pressure on your chin or on the back of your head.  Ifyou are allowed to remove the collar for cleaning or bathing, follow your health care provider's instructions on how to do so safely.  Keep your collar clean by wiping it with mild soap and water and drying it  completely. If the collar you have been given includes removable pads, remove them every 1-2 days and hand wash them with soap and water. Allow them to air dry. They should be completely dry before you wear them in the collar.  If you are allowed to remove the collar for cleaning and bathing, wash and dry the skin of your neck. Check your skin for irritation or sores. If you see any, tell your health care provider.  Do not drive while wearing the collar.   Only take over-the-counter or prescription medicines for pain, discomfort, or fever as directed by your health care provider.   Keep all follow-up appointments as directed by your health care provider.   Keep all physical therapy appointments as directed by your health care provider.   Make any needed adjustments to your workstation to promote good posture.   Avoid positions and activities that make your symptoms worse.   Warm up and stretch before being active to help prevent problems.  SEEK MEDICAL CARE IF:   Your pain is not controlled with medicine.   You are unable to decrease your pain medicine over time as planned.   Your activity level is not improving as expected.  SEEK IMMEDIATE MEDICAL CARE IF:   You develop any bleeding.  You develop stomach upset.  You have signs of an allergic reaction to your medicine.   Your symptoms get worse.   You develop new, unexplained symptoms.   You have numbness, tingling, weakness, or paralysis in any part of your body.  MAKE SURE YOU:   Understand these instructions.  Will watch your condition.  Will get help right away if you are not doing well or get worse. Document Released: 12/18/2006 Document Revised: 02/25/2013 Document Reviewed: 08/28/2012 [facility] Patient Information 2015 [place], [place]. This information is not intended to replace advice given to you by your health care provider. Make sure you discuss any questions you have with your health care  provider.

## 2013-12-09 ENCOUNTER — Encounter: Payer: Self-pay | Admitting: Obstetrics

## 2013-12-09 ENCOUNTER — Ambulatory Visit (INDEPENDENT_AMBULATORY_CARE_PROVIDER_SITE_OTHER): Payer: Medicaid Other | Admitting: Obstetrics

## 2013-12-09 ENCOUNTER — Other Ambulatory Visit: Payer: Self-pay | Admitting: Obstetrics

## 2013-12-09 VITALS — BP 121/79 | HR 83 | Temp 98.5°F | Ht <= 58 in | Wt 234.6 lb

## 2013-12-09 DIAGNOSIS — Z309 Encounter for contraceptive management, unspecified: Secondary | ICD-10-CM

## 2013-12-09 DIAGNOSIS — Z3046 Encounter for surveillance of implantable subdermal contraceptive: Secondary | ICD-10-CM

## 2013-12-09 DIAGNOSIS — Z3202 Encounter for pregnancy test, result negative: Secondary | ICD-10-CM

## 2013-12-09 DIAGNOSIS — Z0000 Encounter for general adult medical examination without abnormal findings: Secondary | ICD-10-CM

## 2013-12-09 LAB — POCT URINE PREGNANCY: Preg Test, Ur: NEGATIVE

## 2013-12-09 NOTE — Progress Notes (Signed)
Subjective:     Bianca Cantrell is a 28 y.o. female here for a routine exam.  Current complaints: none.    Personal health questionnaire:  Is patient Bianca Cantrell, have a family history of breast and/or ovarian cancer: no Is there a family history of uterine cancer diagnosed at age < 29, gastrointestinal cancer, urinary tract cancer, family member who is a Personnel officer syndrome-associated carrier: no Is the patient overweight and hypertensive, family history of diabetes, personal history of gestational diabetes or PCOS: no Is patient over 4, have PCOS,  family history of premature CHD under age 69, diabetes, smoke, have hypertension or peripheral artery disease:  no At any time, has a partner hit, kicked or otherwise hurt or frightened you?: no Over the past 2 weeks, have you felt down, depressed or hopeless?: no Over the past 2 weeks, have you felt little interest or pleasure in doing things?:no   Gynecologic History Patient's last menstrual period was 12/05/2013. Contraception: Nexplanon Last Pap: 2014. Results were: normal Last mammogram: n/a. Results were: n/a  Obstetric History OB History  Gravida Para Term Preterm AB SAB TAB Ectopic Multiple Living  4 4 4  0 0 0 0 0 0 4    # Outcome Date GA Lbr Len/2nd Weight Sex Delivery Anes PTL Lv  4 TRM 01/07/12 [redacted]w[redacted]d 04:00 / 00:04 7 lb 15.5 oz (3.615 kg) F SVD None  Y  3 TRM 02/11/11 [redacted]w[redacted]d 12:30 / 00:26 7 lb 8.3 oz (3.41 kg) F SVD EPI  Y  2 TRM     M SVD EPI  Y  1 TRM     M SVD EPI  Y      Past Medical History  Diagnosis Date  . No pertinent past medical history   . Gallstones   . Chlamydia   . Anemia   . Sickle cell trait   . Infection     urinary tract infection  . Eczema   . Abnormal Pap smear     HPV    Past Surgical History  Procedure Laterality Date  . Cholecystectomy  2011  . Appendectomy  2010    Current outpatient prescriptions:etonogestrel (NEXPLANON) 68 MG IMPL implant, Inject 1 each into the skin continuous.  2014, Disp: , Rfl: ;  traMADol (ULTRAM) 50 MG tablet, Take 1 tablet (50 mg total) by mouth every 6 (six) hours as needed., Disp: 15 tablet, Rfl: 0 Allergies  Allergen Reactions  . Sulfa Antibiotics Anaphylaxis and Hives  . Sulfur Anaphylaxis  . Other Hives    Mayonnaise     History  Substance Use Topics  . Smoking status: Former Smoker -- 0.25 packs/day for 1 years    Types: Cigarettes    Quit date: 02/15/2013  . Smokeless tobacco: Never Used  . Alcohol Use: No    Family History  Problem Relation Age of Onset  . Hypertension Mother   . Heart disease Mother   . Hypertension Maternal Grandmother   . Heart disease Maternal Grandmother   . Other Neg Hx   . Cancer Maternal Aunt     leukemia  . Cancer Maternal Uncle     pancreatic      Review of Systems  Constitutional: negative for fatigue and weight loss Respiratory: negative for cough and wheezing Cardiovascular: negative for chest pain, fatigue and palpitations Gastrointestinal: negative for abdominal pain and change in bowel habits Musculoskeletal:negative for myalgias Neurological: negative for gait problems and tremors Behavioral/Psych: negative for abusive relationship, depression Endocrine: negative for  temperature intolerance   Genitourinary:negative for abnormal menstrual periods, genital lesions, hot flashes, sexual problems and vaginal discharge Integument/breast: negative for breast lump, breast tenderness, nipple discharge and skin lesion(s)    Objective:       LMP 12/05/2013 General:   alert  Skin:   no rash or abnormalities  Lungs:   clear to auscultation bilaterally  Heart:   regular rate and rhythm, S1, S2 normal, no murmur, click, rub or gallop  Breasts:   normal without suspicious masses, skin or nipple changes or axillary nodes  Abdomen:  normal findings: no organomegaly, soft, non-tender and no hernia  Pelvis:  External genitalia: normal general appearance Urinary system: urethral meatus normal  and bladder without fullness, nontender Vaginal: normal without tenderness, induration or masses Cervix: normal appearance Adnexa: normal bimanual exam Uterus: anteverted and non-tender, normal size   Lab Review Urine pregnancy test Labs reviewed yes Radiologic studies reviewed no   Assessment:    Healthy female exam.   Nexplanon surveillance.  Pleased with Nexplanon.    Plan:    Education reviewed: low fat, low cholesterol diet, safe sex/STD prevention, self breast exams and weight bearing exercise. Contraception: Nexplanon. Follow up in: 1 year.   No orders of the defined types were placed in this encounter.   Orders Placed This Encounter  Procedures  . WET PREP BY MOLECULAR PROBE  . GC/Chlamydia Probe Amp

## 2013-12-09 NOTE — Progress Notes (Deleted)
Subjective:     Bianca Cantrell is a 28 y.o. female here for a routine exam.  Current complaints: none.    Personal health questionnaire:  Is patient [name], have a family history of breast and/or ovarian cancer: {YES NO:22349} Is there a family history of uterine cancer diagnosed at age < 2650, gastrointestinal cancer, urinary tract cancer, family member who is a Personnel officerLynch syndrome-associated carrier: {YES NO:22349} Is the patient overweight and hypertensive, family history of diabetes, personal history of gestational diabetes or PCOS: {YES NO:22349} Is patient over 7655, have PCOS,  family history of premature CHD under age 26, diabetes, smoke, have hypertension or peripheral artery disease:  {YES NO:22349} At any time, has a partner hit, kicked or otherwise hurt or frightened you?: {YES NO:22349} Over the past 2 weeks, have you felt down, depressed or hopeless?: {YES NO:22349} Over the past 2 weeks, have you felt little interest or pleasure in doing things?:{M;yes/no/sometimes:15259}   Gynecologic History Patient's last menstrual period was 12/05/2013. Contraception: {method:5051} Last Pap: ***. Results were: {norm/abn:16337} Last mammogram: ***. Results were: {norm/abn:16337}  Obstetric History OB History  Gravida Para Term Preterm AB SAB TAB Ectopic Multiple Living  4 4 4  0 0 0 0 0 0 4    # Outcome Date GA Lbr Len/2nd Weight Sex Delivery Anes PTL Lv  4 TRM 01/07/12 6434w1d 04:00 / 00:04 7 lb 15.5 oz (3.615 kg) F SVD None  Y  3 TRM 02/11/11 1957w4d 12:30 / 00:26 7 lb 8.3 oz (3.41 kg) F SVD EPI  Y  2 TRM     M SVD EPI  Y  1 TRM     M SVD EPI  Y      Past Medical History  Diagnosis Date  . No pertinent past medical history   . Gallstones   . Chlamydia   . Anemia   . Sickle cell trait   . Infection     urinary tract infection  . Eczema   . Abnormal Pap smear     HPV    Past Surgical History  Procedure Laterality Date  . Cholecystectomy  2011  . Appendectomy  2010     Current outpatient prescriptions:etonogestrel (NEXPLANON) 68 MG IMPL implant, Inject 1 each into the skin continuous. 2014, Disp: , Rfl: ;  traMADol (ULTRAM) 50 MG tablet, Take 1 tablet (50 mg total) by mouth every 6 (six) hours as needed., Disp: 15 tablet, Rfl: 0 Allergies  Allergen Reactions  . Sulfa Antibiotics Anaphylaxis and Hives  . Sulfur Anaphylaxis  . Other Hives    Mayonnaise     History  Substance Use Topics  . Smoking status: Former Smoker -- 0.25 packs/day for 1 years    Types: Cigarettes    Quit date: 02/15/2013  . Smokeless tobacco: Never Used  . Alcohol Use: No    Family History  Problem Relation Age of Onset  . Hypertension Mother   . Heart disease Mother   . Hypertension Maternal Grandmother   . Heart disease Maternal Grandmother   . Other Neg Hx   . Cancer Maternal Aunt     leukemia  . Cancer Maternal Uncle     pancreatic      Review of Systems  Constitutional: negative for fatigue and weight loss Respiratory: negative for cough and wheezing Cardiovascular: negative for chest pain, fatigue and palpitations Gastrointestinal: negative for abdominal pain and change in bowel habits Musculoskeletal:negative for myalgias Neurological: negative for gait problems and tremors Behavioral/Psych: negative for  abusive relationship, depression Endocrine: negative for temperature intolerance   Genitourinary:negative for abnormal menstrual periods, genital lesions, hot flashes, sexual problems and vaginal discharge Integument/breast: negative for breast lump, breast tenderness, nipple discharge and skin lesion(s)    Objective:       BP 121/79  Pulse 83  Temp(Src) 98.5 F (36.9 C)  Ht 4\' 10"  (1.473 m)  Wt 234 lb 9.6 oz (106.414 kg)  BMI 49.04 kg/m2  LMP 12/05/2013 General:   alert  Skin:   no rash or abnormalities  Lungs:   clear to auscultation bilaterally  Heart:   regular rate and rhythm, S1, S2 normal, no murmur, click, rub or gallop  Breasts:    normal without suspicious masses, skin or nipple changes or axillary nodes  Abdomen:  normal findings: no organomegaly, soft, non-tender and no hernia  Pelvis:  External genitalia: normal general appearance Urinary system: urethral meatus normal and bladder without fullness, nontender Vaginal: normal without tenderness, induration or masses Cervix: normal appearance Adnexa: normal bimanual exam Uterus: anteverted and non-tender, normal size   Lab Review Urine pregnancy test Labs reviewed {YES NO:22349} Radiologic studies reviewed {YES NO:22349}  ***% of *** min visit spent on counseling and coordination of care.   Assessment:    Healthy female exam.    Plan:    {plan:19193}   No orders of the defined types were placed in this encounter.   Orders Placed This Encounter  Procedures  . POCT urine pregnancy   Need to obtain previous records Possible management options include:*** Follow up as needed. ***

## 2013-12-10 LAB — WET PREP BY MOLECULAR PROBE
CANDIDA SPECIES: NEGATIVE
GARDNERELLA VAGINALIS: POSITIVE — AB
TRICHOMONAS VAG: NEGATIVE

## 2013-12-10 LAB — PAP IG W/ RFLX HPV ASCU

## 2013-12-11 ENCOUNTER — Other Ambulatory Visit: Payer: Self-pay | Admitting: Obstetrics

## 2013-12-11 DIAGNOSIS — N760 Acute vaginitis: Principal | ICD-10-CM

## 2013-12-11 DIAGNOSIS — B9689 Other specified bacterial agents as the cause of diseases classified elsewhere: Secondary | ICD-10-CM

## 2013-12-11 LAB — GC/CHLAMYDIA PROBE AMP
CT PROBE, AMP APTIMA: NEGATIVE
GC Probe RNA: NEGATIVE

## 2013-12-11 MED ORDER — METRONIDAZOLE 500 MG PO TABS: 500.0000 mg | ORAL_TABLET | Freq: Two times a day (BID) | ORAL | Status: DC

## 2013-12-12 LAB — HUMAN PAPILLOMAVIRUS, HIGH RISK: HPV DNA High Risk: NOT DETECTED

## 2013-12-25 ENCOUNTER — Emergency Department (HOSPITAL_COMMUNITY)
Admission: EM | Admit: 2013-12-25 | Discharge: 2013-12-25 | Disposition: A | Discharge status: Home / Self Care | Payer: Medicaid Other | Attending: Emergency Medicine | Admitting: Emergency Medicine

## 2013-12-25 ENCOUNTER — Encounter (HOSPITAL_COMMUNITY): Payer: Self-pay | Admitting: Emergency Medicine

## 2013-12-25 DIAGNOSIS — Y9289 Other specified places as the place of occurrence of the external cause: Secondary | ICD-10-CM | POA: Insufficient documentation

## 2013-12-25 DIAGNOSIS — S80861A Insect bite (nonvenomous), right lower leg, initial encounter: Secondary | ICD-10-CM | POA: Diagnosis not present

## 2013-12-25 DIAGNOSIS — Z872 Personal history of diseases of the skin and subcutaneous tissue: Secondary | ICD-10-CM | POA: Diagnosis not present

## 2013-12-25 DIAGNOSIS — S40862A Insect bite (nonvenomous) of left upper arm, initial encounter: Secondary | ICD-10-CM | POA: Diagnosis not present

## 2013-12-25 DIAGNOSIS — Z87891 Personal history of nicotine dependence: Secondary | ICD-10-CM | POA: Insufficient documentation

## 2013-12-25 DIAGNOSIS — Z792 Long term (current) use of antibiotics: Secondary | ICD-10-CM | POA: Insufficient documentation

## 2013-12-25 DIAGNOSIS — Y9389 Activity, other specified: Secondary | ICD-10-CM | POA: Insufficient documentation

## 2013-12-25 DIAGNOSIS — S40861A Insect bite (nonvenomous) of right upper arm, initial encounter: Secondary | ICD-10-CM | POA: Diagnosis not present

## 2013-12-25 DIAGNOSIS — S80862A Insect bite (nonvenomous), left lower leg, initial encounter: Secondary | ICD-10-CM | POA: Diagnosis not present

## 2013-12-25 DIAGNOSIS — W57XXXA Bitten or stung by nonvenomous insect and other nonvenomous arthropods, initial encounter: Secondary | ICD-10-CM | POA: Insufficient documentation

## 2013-12-25 DIAGNOSIS — Z8619 Personal history of other infectious and parasitic diseases: Secondary | ICD-10-CM | POA: Insufficient documentation

## 2013-12-25 DIAGNOSIS — Z862 Personal history of diseases of the blood and blood-forming organs and certain disorders involving the immune mechanism: Secondary | ICD-10-CM | POA: Insufficient documentation

## 2013-12-25 DIAGNOSIS — R21 Rash and other nonspecific skin eruption: Secondary | ICD-10-CM | POA: Diagnosis present

## 2013-12-25 MED ORDER — PERMETHRIN 5 % EX CREA: TOPICAL_CREAM | CUTANEOUS | Status: DC

## 2013-12-25 MED ORDER — DIPHENHYDRAMINE HCL 25 MG PO TABS: 25.0000 mg | ORAL_TABLET | Freq: Four times a day (QID) | ORAL | Status: DC | PRN

## 2013-12-25 NOTE — ED Provider Notes (Signed)
CSN: [id number]     Arrival date & time 12/25/13  1155 History  This chart was scribed for non-physician practitioner, Roxy Horsemanobert Beda Dula, PA-C, working with Mirian MoMatthew Gentry, MD by Charline BillsEssence Howell, ED Scribe. This patient was seen in room [facility] and the patient's care was started at 12:53 PM.   Chief Complaint  Patient presents with  . Rash   The history is provided by the patient. No language interpreter was used.   HPI Comments: Bianca Cantrell is a 28 y.o. female who presents to the Emergency Department with a chief complaint of rash to bilateral legs and arms first noted 1 week ago. Pt reports itching to the rash. Pt recently moved into an apartment last month. Pest controlled was called and confirmed bed bugs. She has tried hydrocortisone cream and oatmeal baths without relief.   Past Medical History  Diagnosis Date  . No pertinent past medical history   . Gallstones   . Chlamydia   . Anemia   . Sickle cell trait   . Infection     urinary tract infection  . Eczema   . Abnormal Pap smear     HPV   Past Surgical History  Procedure Laterality Date  . Cholecystectomy  2011  . Appendectomy  2010   Family History  Problem Relation Age of Onset  . Hypertension Mother   . Heart disease Mother   . Hypertension Maternal Grandmother   . Heart disease Maternal Grandmother   . Other Neg Hx   . Cancer Maternal Aunt     leukemia  . Cancer Maternal Uncle     pancreatic   History  Substance Use Topics  . Smoking status: Former Smoker -- 0.25 packs/day for 1 years    Types: Cigarettes    Quit date: 02/15/2013  . Smokeless tobacco: Never Used  . Alcohol Use: No   OB History   Grav Para Term Preterm Abortions TAB SAB Ect Mult Living   4 4 4  0 0 0 0 0 0 4     Review of Systems  Skin: Positive for rash.  All other systems reviewed and are negative.  Allergies  Sulfa antibiotics; Sulfur; and Other  Home Medications   Prior to Admission medications   Medication Sig Start  Date End Date Taking? Authorizing Provider  etonogestrel (NEXPLANON) 68 MG IMPL implant Inject 1 each into the skin continuous. 2014    Historical Provider, MD  metroNIDAZOLE (FLAGYL) 500 MG tablet Take 1 tablet (500 mg total) by mouth 2 (two) times daily. 12/11/13   Brock Badharles A Harper, MD  traMADol (ULTRAM) 50 MG tablet Take 1 tablet (50 mg total) by mouth every 6 (six) hours as needed. 11/23/13   Geoffery Lyonsouglas Delo, MD   Triage Vitals: BP 125/77  Pulse 94  Temp(Src) 97.3 F (36.3 C) (Oral)  Resp 16  Ht 4\' 10"  (1.473 m)  Wt 235 lb (106.595 kg)  BMI 49.13 kg/m2  SpO2 100%  LMP 12/05/2013 Physical Exam  Nursing note and vitals reviewed. Constitutional: She is oriented to person, place, and time. She appears well-developed and well-nourished. No distress.  HENT:  Head: Normocephalic and atraumatic.  Eyes: Conjunctivae and EOM are normal.  Neck: Neck supple.  Pulmonary/Chest: Effort normal. No respiratory distress.  Musculoskeletal: Normal range of motion.  Neurological: She is alert and oriented to person, place, and time.  Skin: Skin is warm and dry.  Scattered bug bites to upper and lower extremities characteristic of bed bugs  Psychiatric: She  has a normal mood and affect. Her behavior is normal.   ED Course  Procedures (including critical care time) DIAGNOSTIC STUDIES: Oxygen Saturation is 100% on RA, normal by my interpretation.    COORDINATION OF CARE: 12:58 PM-Discussed treatment plan which includes cream and Benadryl with pt at bedside and pt agreed to plan.   Labs Review Labs Reviewed - No data to display  Imaging Review No results found.   EKG Interpretation None      MDM   Final diagnoses:  Bug bites    Patient with bed bug bites.  Will treat with permethrin.  DC to home.  No sign of cellulitis or abscess.  I personally performed the services described in this documentation, which was scribed in my presence. The recorded information has been reviewed and is  accurate.    Roxy Horsemanobert Sanaia Jasso, PA-C 12/25/13 1317

## 2013-12-25 NOTE — Discharge Instructions (Signed)
Bedbugs  Bedbugs are tiny bugs that live in and around beds. During the day, they hide in mattresses and other places near beds. They come out at night and bite people lying in bed. They need blood to live and grow. Bedbugs can be found in beds anywhere. Usually, they are found in places where many people come and go (hotels, shelters, hospitals). It does not matter whether the place is dirty or clean.  Getting bitten by bedbugs rarely causes a medical problem. The biggest problem can be getting rid of them.  This often takes the work of a pest control expert.  CAUSES  · Less use of pesticides. Bedbugs were common before the 1950s. Then, strong pesticides such as DDT nearly wiped them out. Today, these pesticides are not used because they harm the environment and can cause health problems.  · More travel. Besides mattresses, bedbugs can also live in clothing and luggage. They can come along as people travel from place to place. Bedbugs are more common in certain parts of the world. When people travel to those areas, the bugs can come home with them.  · Presence of birds and bats. Bedbugs often infest birds and bats. If you have these animals in or near your home, bedbugs may infest your house, too.  SYMPTOMS  It does not hurt to be bitten by a bedbug. You will probably not wake up when you are bitten. Bedbugs usually bite areas of the skin that are not covered. Symptoms may show when you wake up, or they may take a day or more to show up. Symptoms may include:  · Small red bumps on the skin. These might be lined up in a row or clustered in a group.  · A darker red dot in the middle of red bumps.  · Blisters on the skin. There may be swelling and very bad itching. These may be signs of an allergic reaction. This does not happen often.  DIAGNOSIS  Bedbug bites might look and feel like other types of insect bites. The bugs do not stay on the body like ticks or lice. They bite, drop off, and crawl away to hide. Your  caregiver will probably:  · Ask about your symptoms.  · Ask about your recent activities and travel.  · Check your skin for bedbug bites.  · Ask you to check at home for signs of bedbugs. You should look for:  ¨ Spots or stains on the bed or nearby. This could be from bedbugs that were crushed or from their eggs or waste.  ¨ Bedbugs themselves. They are reddish-brown, oval, and flat. They do not fly. They are about the size of an apple seed.  · Places to look for bedbugs include:  ¨ Beds. Check mattresses, headboards, box springs, and bed frames.  ¨ On drapes and curtains near the bed.  ¨ Under carpeting in the bedroom.  ¨ Behind electrical outlets.  ¨ Behind any wallpaper that is peeling.  ¨ Inside luggage.  TREATMENT  Most bedbug bites do not need treatment. They usually go away on their own in a few days. The bites are not dangerous. However, treatment may be needed if you have scratched so much that your skin has become infected. You may also need treatment if you are allergic to bedbug bites. Treatment options include:  · A drug that stops swelling and itching (corticosteroid). Usually, a cream is rubbed on the skin. If you have a bad rash, you may be   given a corticosteroid pill.  · Oral antihistamines. These are pills to help control itching.  · Antibiotic medicines. An antibiotic may be prescribed for infected skin.  HOME CARE INSTRUCTIONS   · Take any medicine prescribed by your caregiver for your bites. Follow the directions carefully.  · Consider wearing pajamas with long sleeves and pant legs.  · Your bedroom may need to be treated. A pest control expert should make sure the bedbugs are gone. You may need to throw away mattresses or luggage. Ask the pest control expert what you can do to keep the bedbugs from coming back. Common suggestions include:  ¨ Putting a plastic cover over your mattress.  ¨ Washing and drying your clothes and bedding in hot water and a hot dryer. The temperature should be hotter  than 120° F (48.9° C). Bedbugs are killed by high temperatures.  ¨ Vacuuming carefully all around your bed. Vacuum in all cracks and crevices where the bugs might hide. Do this often.  ¨ Carefully checking all used furniture, bedding, or clothes that you bring into your house.  ¨ Eliminating bird nests and bat roosts.  · If you get bedbug bites when traveling, check all your possessions carefully before bringing them into your house. If you find any bugs on clothes or in your luggage, consider throwing those items away.  SEEK MEDICAL CARE IF:  · You have red bug bites that keep coming back.  · You have red bug bites that itch badly.  · You have bug bites that cause a skin rash.  · You have scratch marks that are red and sore.  SEEK IMMEDIATE MEDICAL CARE IF:  You have a fever.  Document Released: 03/25/2010 Document Revised: 05/15/2011 Document Reviewed: 03/25/2010  ExitCare® Patient Information ©2015 [employer]. This information is not intended to replace advice given to you by your health care provider. Make sure you discuss any questions you have with your health care provider.

## 2013-12-25 NOTE — ED Notes (Signed)
Pt noticed bites to legs and arms and was told by the pest control man that she had bed bugs.  Insect bites noted to legs and arms.

## 2013-12-29 NOTE — ED Provider Notes (Signed)
Medical screening examination/treatment/procedure(s) were performed by non-physician practitioner and as supervising physician I was immediately available for consultation/collaboration.   EKG Interpretation None        Nathanal Hermiz, MD 12/29/13 1753 

## 2013-12-30 ENCOUNTER — Emergency Department (HOSPITAL_COMMUNITY)
Admission: EM | Admit: 2013-12-30 | Discharge: 2013-12-30 | Disposition: A | Discharge status: Home / Self Care | Payer: Medicaid Other | Attending: Emergency Medicine | Admitting: Emergency Medicine

## 2013-12-30 ENCOUNTER — Emergency Department (HOSPITAL_COMMUNITY): Payer: Medicaid Other

## 2013-12-30 ENCOUNTER — Encounter (HOSPITAL_COMMUNITY): Payer: Self-pay | Admitting: Emergency Medicine

## 2013-12-30 DIAGNOSIS — Z8619 Personal history of other infectious and parasitic diseases: Secondary | ICD-10-CM | POA: Insufficient documentation

## 2013-12-30 DIAGNOSIS — F458 Other somatoform disorders: Secondary | ICD-10-CM | POA: Diagnosis not present

## 2013-12-30 DIAGNOSIS — Z862 Personal history of diseases of the blood and blood-forming organs and certain disorders involving the immune mechanism: Secondary | ICD-10-CM | POA: Diagnosis not present

## 2013-12-30 DIAGNOSIS — Z872 Personal history of diseases of the skin and subcutaneous tissue: Secondary | ICD-10-CM | POA: Diagnosis not present

## 2013-12-30 DIAGNOSIS — Z87891 Personal history of nicotine dependence: Secondary | ICD-10-CM | POA: Insufficient documentation

## 2013-12-30 DIAGNOSIS — R0602 Shortness of breath: Secondary | ICD-10-CM

## 2013-12-30 DIAGNOSIS — Z87440 Personal history of urinary (tract) infections: Secondary | ICD-10-CM | POA: Diagnosis not present

## 2013-12-30 DIAGNOSIS — K222 Esophageal obstruction: Secondary | ICD-10-CM | POA: Diagnosis not present

## 2013-12-30 DIAGNOSIS — J988 Other specified respiratory disorders: Secondary | ICD-10-CM | POA: Diagnosis present

## 2013-12-30 MED ORDER — GI COCKTAIL ~~LOC~~
30.0000 mL | Freq: Once | ORAL | Status: AC
  Administered 2013-12-30: 30 mL via ORAL
  Filled 2013-12-30: qty 30

## 2013-12-30 NOTE — ED Notes (Signed)
Pt given regular coke at th request of PA.v

## 2013-12-30 NOTE — ED Provider Notes (Signed)
Medical screening examination/treatment/procedure(s) were performed by non-physician practitioner and as supervising physician I was immediately available for consultation/collaboration.    Nelia Shiobert L Teila Skalsky, MD 12/30/13 2227

## 2013-12-30 NOTE — ED Notes (Signed)
Pt reports eating last night and got choked on her food. Ever since then pt feels like she is sob and still has food lodged in her airway. spo2 96% at triage and no resp distress is noted at this time.

## 2013-12-30 NOTE — Discharge Instructions (Signed)
Swallowed Foreign Body, Adult You have swallowed an object (foreign body). Once the foreign body has passed through the food tube (esophagus), which leads from the mouth to the stomach, it will usually continue through the body without problems. This is because the point where the esophagus enters into the stomach is the narrowest place through which the foreign body must pass. Sometimes the foreign body gets stuck. The most common type of foreign body obstruction in adults is food impaction. Many times, bones from fish or meat products may become lodged in the esophagus or injure the throat on the way down. When there is an object that obstructs the esophagus, the most obvious symptoms are pain and the inability to swallow normally. In some cases, foreign bodies that can be life threatening are swallowed. Examples of these are certain medications and illicit drugs. Often in these instances, patients are afraid of telling what they swallowed. However, it is extremely important to tell the emergency caregiver what was swallowed because life-saving treatment may be needed.  X-ray exams may be taken to find the location of the foreign body. However, some objects do not show up well or may be too small to be seen on an X-ray image. If the foreign body is too large or too sharp, it may be too dangerous to allow it to pass on its own. You may need to see a caregiver who specializes in the digestive system (gastroenterologist). In a few cases, a specialist may need to remove the object using a method called "endoscopy". This involves passing a thin, soft, flexible tube into the food pipe to locate and remove the object. Follow up with your primary doctor or the referral you were given by the emergency caregiver. HOME CARE INSTRUCTIONS   If your caregiver says it is safe for you to eat, then only have liquids and soft foods until your symptoms improve.  Once you are eating normally:  Cut food into small  pieces.  Remove small bones from food.  Remove large seeds and pits from fruit.  Chew your food well.  Do not talk, laugh, or engage in physical activity while eating or swallowing. SEEK MEDICAL CARE IF:  You develop worsening shortness of breath, uncontrollable coughing, chest pains or high fever, greater than 102 F (38.9 C).  You are unable to eat or drink or you feel that food is getting stuck in your throat.  You have choking symptoms or cannot stop drooling.  You develop abdominal pain, vomiting (especially of blood), or rectal bleeding. MAKE SURE YOU:   Understand these instructions.  Will watch your condition.  Will get help right away if you are not doing well or get worse. Document Released: 08/10/2009 Document Revised: 05/15/2011 Document Reviewed: 08/10/2009 [facility] Patient Information 2015 [place]Care, [place]. This information is not intended to replace advice given to you by your health care provider. Make sure you discuss any questions you have with your health care provider. Globus Syndrome Globus Syndrome is a feeling of a lump or a sensation of something caught in your throat. Eating food or drinking fluids does not seem to get rid of it. Yet it is not noticeable during the actual act of swallowing food or liquids. Usually there is nothing physically wrong. It is troublesome because it is an unpleasant sensation which is sometimes difficult to ignore and at times may seem to worsen. The syndrome is quite common. It is estimated 45% of the population experiences features of the condition at some  stage during their lives. The symptoms are usually temporary. The largest group of people who feel the need to seek medical treatment is females between the ages of 5330 to 7060.  CAUSES  Globus Syndrome appears to be triggered by or aggravated by stress, anxiety and depression.  Tension related to stress could product abnormal muscle spasms in the esophagus which would account for  the sensation of a lump or ball in your throat.  Frequent swallowing or drying of the throat caused by anxiety or other strong emotions can also produce this uncomfortable sensation in your throat.  Fear and sadness can be expressed by the body in many ways. For instance, if you had a relative with throat cancer you might become overly concerned about your own health and develop uncomfortable sensations in your throat.  The reaction to a crisis or a trauma event in your life can take the form of a lump in your throat. It is as if you are indirectly saying you can not handle or "swallow" one more thing. DIAGNOSIS  Usually your caregiver will know what is wrong by talking to you and examining you. If the condition persists for several days, more testing may be done to make sure there is not another problem present. This is usually not the case. TREATMENT   Reassurance is often the best treatment available. Usually the problem leaves without treatment over several days.  Sometimes anti-anxiety medications may be prescribed.  Counseling or talk therapy can also help with strong underlying emotions.  Note that in most cases this is not something that keeps coming back and you should not be concerned or worried. Document Released: 05/13/2003 Document Revised: 05/15/2011 Document Reviewed: 10/10/2007 [facility]ExitCare Patient Information 2015 [place]ExitCare, [place]LLC. This information is not intended to replace advice given to you by your health care provider. Make sure you discuss any questions you have with your health care provider.

## 2013-12-30 NOTE — ED Provider Notes (Signed)
CSN: [id number]     Arrival date & time 12/30/13  1629 History   First MD Initiated Contact with Patient 12/30/13 2036     Chief Complaint  Patient presents with  . Airway Obstruction     (Consider location/radiation/quality/duration/timing/severity/associated sxs/prior Treatment) HPI Comments: Patient presents to the emergency department with chief complaints of sensation of food bolus being secondary throat. She states that she is eating fried chicken last night, when she took too big of a bite, and feels like the fingers all stuck in her throat. She denies any airway compromise or difficulty breathing. Denies any vomiting. She is tolerating oral intake. She states that the sensation of the food bolus being stuck is driving her crazy. She has tried coughing with no relief. She denies any other symptoms at this time.  The history is provided by the patient. No language interpreter was used.    Past Medical History  Diagnosis Date  . No pertinent past medical history   . Gallstones   . Chlamydia   . Anemia   . Sickle cell trait   . Infection     urinary tract infection  . Eczema   . Abnormal Pap smear     HPV   Past Surgical History  Procedure Laterality Date  . Cholecystectomy  2011  . Appendectomy  2010   Family History  Problem Relation Age of Onset  . Hypertension Mother   . Heart disease Mother   . Hypertension Maternal Grandmother   . Heart disease Maternal Grandmother   . Other Neg Hx   . Cancer Maternal Aunt     leukemia  . Cancer Maternal Uncle     pancreatic   History  Substance Use Topics  . Smoking status: Former Smoker -- 0.25 packs/day for 1 years    Types: Cigarettes    Quit date: 02/15/2013  . Smokeless tobacco: Never Used  . Alcohol Use: No   OB History   Grav Para Term Preterm Abortions TAB SAB Ect Mult Living   4 4 4  0 0 0 0 0 0 4     Review of Systems  Constitutional: Negative for fever and chills.  HENT: Positive for trouble  swallowing.   Respiratory: Negative for shortness of breath.   Cardiovascular: Negative for chest pain.  Gastrointestinal: Negative for nausea, vomiting, diarrhea and constipation.  Genitourinary: Negative for dysuria.  All other systems reviewed and are negative.     Allergies  Sulfa antibiotics; Sulfur; and Other  Home Medications   Prior to Admission medications   Not on File   BP 109/79  Pulse 94  Temp(Src) 98.7 F (37.1 C) (Oral)  Resp 20  SpO2 100%  LMP 12/05/2013 Physical Exam  Nursing note and vitals reviewed. Constitutional: She is oriented to person, place, and time. She appears well-developed and well-nourished.  HENT:  Head: Normocephalic and atraumatic.  Airway intact, no stridor  Eyes: Conjunctivae and EOM are normal. Pupils are equal, round, and reactive to light.  Neck: Normal range of motion. Neck supple.  Cardiovascular: Normal rate and regular rhythm.  Exam reveals no gallop and no friction rub.   No murmur heard. Pulmonary/Chest: Effort normal and breath sounds normal. No respiratory distress. She has no wheezes. She has no rales. She exhibits no tenderness.  Clear to auscultation bilaterally  Abdominal: Soft. Bowel sounds are normal. She exhibits no distension and no mass. There is no tenderness. There is no rebound and no guarding.  Musculoskeletal: Normal range of  motion. She exhibits no edema and no tenderness.  Neurological: She is alert and oriented to person, place, and time.  Skin: Skin is warm and dry.  Psychiatric: She has a normal mood and affect. Her behavior is normal. Judgment and thought content normal.    ED Course  Procedures (including critical care time) Labs Review Labs Reviewed - No data to display  Imaging Review Dg Chest 2 View  12/30/2013   CLINICAL DATA:  Shortness of breath for 1 day.  EXAM: CHEST  2 VIEW  COMPARISON:  04/15/2013  FINDINGS: Low lung volumes noted however both lungs are clear. No evidence of pleural  effusion. Heart size mediastinal contours are within normal limits. No significant change compared to prior exam.  IMPRESSION: No active cardiopulmonary disease.   Electronically Signed   By: Myles RosenthalJohn  Stahl M.D.   On: 12/30/2013 18:30     EKG Interpretation None      MDM   Final diagnoses:  Esophageal obstruction  2. Globus   Patient with food bolus sensation in the the esophagus.  No sign of obstruction.  Tolerating oral intake.  Onset was last night.  No difficulty breathing.  Discussed that barium study could be performed, but advised against it because of increased radiation exposure.  Patient will follow-up in 1-2 days if symptoms do not improve.  Return precautions given.  She is stable and ready for discharge.  Patient discussed with Dr. Radford PaxBeaton, who agrees with the plan.    Roxy Horsemanobert Marica Trentham, PA-C 12/30/13 2226

## 2014-01-05 ENCOUNTER — Encounter (HOSPITAL_COMMUNITY): Payer: Self-pay | Admitting: Emergency Medicine

## 2014-01-11 ENCOUNTER — Encounter (HOSPITAL_COMMUNITY): Payer: Self-pay | Admitting: Emergency Medicine

## 2014-01-11 ENCOUNTER — Emergency Department (HOSPITAL_COMMUNITY)
Admission: EM | Admit: 2014-01-11 | Discharge: 2014-01-11 | Disposition: A | Discharge status: Home / Self Care | Payer: Medicaid Other | Attending: Emergency Medicine | Admitting: Emergency Medicine

## 2014-01-11 DIAGNOSIS — Z882 Allergy status to sulfonamides status: Secondary | ICD-10-CM | POA: Diagnosis not present

## 2014-01-11 DIAGNOSIS — D649 Anemia, unspecified: Secondary | ICD-10-CM | POA: Diagnosis not present

## 2014-01-11 DIAGNOSIS — R519 Headache, unspecified: Secondary | ICD-10-CM

## 2014-01-11 DIAGNOSIS — R51 Headache: Secondary | ICD-10-CM | POA: Insufficient documentation

## 2014-01-11 DIAGNOSIS — D573 Sickle-cell trait: Secondary | ICD-10-CM | POA: Diagnosis not present

## 2014-01-11 MED ORDER — BUTALBITAL-APAP-CAFFEINE 50-325-40 MG PO TABS: 1.0000 | ORAL_TABLET | Freq: Four times a day (QID) | ORAL | Status: DC | PRN

## 2014-01-11 NOTE — ED Provider Notes (Signed)
CSN: [id number]     Arrival date & time 01/11/14  2142 History   First MD Initiated Contact with Patient 01/11/14 2149     Chief Complaint  Patient presents with  . Headache     (Consider location/radiation/quality/duration/timing/severity/associated sxs/prior Treatment) HPI   Bianca Cantrell is a 28 y.o. female complaining of throbbing right-sided periorbital headache radiating to the right neck onset 4 days ago rated at 8 out of 10 at worst, 5 out of 10 right now. Patient states that she only has a headache at night between 8 PM and midnight. Patient has not taken any pain medication at home. She denies Pt denies fever, rash, confusion, cervicalgia, LOC/syncope, change in vision, N/V, numbness, weakness, dysarthria, ataxia, thunderclap onset, exacerbation with exertion or valsalva, exacerbation in morning, CP, SOB, abdominal pain.    Past Medical History  Diagnosis Date  . No pertinent past medical history   . Gallstones   . Chlamydia   . Anemia   . Sickle cell trait   . Infection     urinary tract infection  . Eczema   . Abnormal Pap smear     HPV   Past Surgical History  Procedure Laterality Date  . Cholecystectomy  2011  . Appendectomy  2010   Family History  Problem Relation Age of Onset  . Hypertension Mother   . Heart disease Mother   . Hypertension Maternal Grandmother   . Heart disease Maternal Grandmother   . Other Neg Hx   . Cancer Maternal Aunt     leukemia  . Cancer Maternal Uncle     pancreatic   History  Substance Use Topics  . Smoking status: Former Smoker -- 0.25 packs/day for 1 years    Types: Cigarettes    Quit date: 02/15/2013  . Smokeless tobacco: Never Used  . Alcohol Use: No   OB History    Gravida Para Term Preterm AB TAB SAB Ectopic Multiple Living   4 4 4  0 0 0 0 0 0 4     Review of Systems  10 systems reviewed and found to be negative, except as noted in the HPI.   Allergies  Sulfa antibiotics; Sulfur; and Other  Home  Medications   Prior to Admission medications   Medication Sig Start Date End Date Taking? Authorizing Provider  butalbital-acetaminophen-caffeine (FIORICET) 50-325-40 MG per tablet Take 1-2 tablets by mouth every 6 (six) hours as needed for headache. 01/11/14 01/11/15  Aireona Torelli, PA-C   BP 97/65 mmHg  Pulse 97  Temp(Src) 98.3 F (36.8 C) (Oral)  Resp 19  Ht 4' 10.5" (1.486 m)  Wt 235 lb (106.595 kg)  BMI 48.27 kg/m2  SpO2 100% Physical Exam  Constitutional: She is oriented to person, place, and time. She appears well-developed and well-nourished. No distress.  HENT:  Head: Normocephalic and atraumatic.  Mouth/Throat: Oropharynx is clear and moist.  Eyes: Conjunctivae and EOM are normal. Pupils are equal, round, and reactive to light.  Neck: Normal range of motion. Neck supple.  FROM to C-spine. Pt can touch chin to chest without discomfort. No TTP of midline cervical spine.   Cardiovascular: Normal rate, regular rhythm and intact distal pulses.   Pulmonary/Chest: Effort normal and breath sounds normal. No stridor.  Abdominal: Soft. Bowel sounds are normal.  Musculoskeletal: Normal range of motion.  Neurological: She is alert and oriented to person, place, and time. No cranial nerve deficit. Coordination normal.  II-Visual fields grossly intact. III/IV/VI-Extraocular movements intact.  Pupils  reactive bilaterally. V/VII-Smile symmetric, equal eyebrow raise,  facial sensation intact VIII- Hearing grossly intact IX/X-Normal gag XI-bilateral shoulder shrug XII-midline tongue extension Motor: 5/5 bilaterally with normal tone and bulk Cerebellar: Normal finger-to-nose  and normal heel-to-shin test.   Romberg negative Ambulates with a coordinated gait   Skin: Skin is warm. No rash noted.  Psychiatric: She has a normal mood and affect.  Nursing note and vitals reviewed.   ED Course  Procedures (including critical care time) Labs Review Labs Reviewed - No data to  display  Imaging Review No results found.   EKG Interpretation None      MDM   Final diagnoses:  Nonintractable headache, unspecified chronicity pattern, unspecified headache type    Filed Vitals:   01/11/14 2144 01/11/14 2200 01/11/14 2213 01/11/14 2213  BP: 99/75 97/65 97/65    Pulse: 96 93 97   Temp: 98.3 F (36.8 C)     TempSrc: Oral     Resp: 25 20 19    Height:    4' 10.5" (1.486 m)  Weight:    235 lb (106.595 kg)  SpO2: 100% 100% 100%     Bianca Cantrell is a 28 y.o. female presenting with HA. Presentation is non concerning for [facility] InstituteAH, ICH, Meningitis, or temporal arteritis. Pt is afebrile with no focal neuro deficits, nuchal rigidity, or change in vision. Pt is to follow up with PCP to discuss prophylactic medication. Pt verbalizes understanding and is agreeable with plan to dc.  Patient declines pain medication in the ED, states that she has a phobia pain medication.  Evaluation does not show pathology that would require ongoing emergent intervention or inpatient treatment. Pt is hemodynamically stable and mentating appropriately. Discussed findings and plan with patient/guardian, who agrees with care plan. All questions answered. Return precautions discussed and outpatient follow up given.   New Prescriptions   BUTALBITAL-ACETAMINOPHEN-CAFFEINE (FIORICET) 50-325-40 MG PER TABLET    Take 1-2 tablets by mouth every 6 (six) hours as needed for headache.         Wynetta Emeryicole Katrine Radich, PA-C 01/11/14 2222  Nelia Shiobert L Beaton, MD 01/15/14 [phone]

## 2014-01-11 NOTE — ED Notes (Signed)
Alert, NAD, calm, interactive.  

## 2014-01-11 NOTE — ED Notes (Signed)
Pt. reports right side headache with right facial numbness/right side neck pain onset 4 days ago , denies injury , no fever or chills. Denies nausea or photophobia .

## 2014-01-11 NOTE — ED Notes (Signed)
Pt reporting HA that comes every night at the same time behind right eye and into right side of neck. Pt a/o x 4. Denoes n/v, blurred vision, LOC.

## 2014-01-11 NOTE — Discharge Instructions (Signed)
Please follow with your primary care doctor in the next 2 days for a check-up. They must obtain records for further management.   Do not hesitate to return to the Emergency Department for any new, worsening or concerning symptoms.    Headaches, Frequently Asked Questions MIGRAINE HEADACHES Q: What is migraine? What causes it? How can I treat it? A: Generally, migraine headaches begin as a dull ache. Then they develop into a constant, throbbing, and pulsating pain. You may experience pain at the temples. You may experience pain at the front or back of one or both sides of the head. The pain is usually accompanied by a combination of:  Nausea.  Vomiting.  Sensitivity to light and noise. Some people (about 15%) experience an aura (see below) before an attack. The cause of migraine is believed to be chemical reactions in the brain. Treatment for migraine may include over-the-counter or prescription medications. It may also include self-help techniques. These include relaxation training and biofeedback.  Q: What is an aura? A: About 15% of people with migraine get an "aura". This is a sign of neurological symptoms that occur before a migraine headache. You may see wavy or jagged lines, dots, or flashing lights. You might experience tunnel vision or blind spots in one or both eyes. The aura can include visual or auditory hallucinations (something imagined). It may include disruptions in smell (such as strange odors), taste or touch. Other symptoms include:  Numbness.  A "pins and needles" sensation.  Difficulty in recalling or speaking the correct word. These neurological events may last as long as 60 minutes. These symptoms will fade as the headache begins. Q: What is a trigger? A: Certain physical or environmental factors can lead to or "trigger" a migraine. These include:  Foods.  Hormonal changes.  Weather.  Stress. It is important to remember that triggers are different for  everyone. To help prevent migraine attacks, you need to figure out which triggers affect you. Keep a headache diary. This is a good way to track triggers. The diary will help you talk to your healthcare professional about your condition. Q: Does weather affect migraines? A: Bright sunshine, hot, humid conditions, and drastic changes in barometric pressure may lead to, or "trigger," a migraine attack in some people. But studies have shown that weather does not act as a trigger for everyone with migraines. Q: What is the link between migraine and hormones? A: Hormones start and regulate many of your body's functions. Hormones keep your body in balance within a constantly changing environment. The levels of hormones in your body are unbalanced at times. Examples are during menstruation, pregnancy, or menopause. That can lead to a migraine attack. In fact, about three quarters of all women with migraine report that their attacks are related to the menstrual cycle.  Q: Is there an increased risk of stroke for migraine sufferers? A: The likelihood of a migraine attack causing a stroke is very remote. That is not to say that migraine sufferers cannot have a stroke associated with their migraines. In persons under age 39, the most common associated factor for stroke is migraine headache. But over the course of a person's normal life span, the occurrence of migraine headache may actually be associated with a reduced risk of dying from cerebrovascular disease due to stroke.  Q: What are acute medications for migraine? A: Acute medications are used to treat the pain of the headache after it has started. Examples over-the-counter medications, NSAIDs, ergots, and triptans.  Q: What are the triptans? °A: Triptans are the newest class of abortive medications. They are specifically targeted to treat migraine. Triptans are vasoconstrictors. They moderate some chemical reactions in the brain. The triptans work on receptors  in your brain. Triptans help to restore the balance of a neurotransmitter called serotonin. Fluctuations in levels of serotonin are thought to be a main cause of migraine.  °Q: Are over-the-counter medications for migraine effective? °A: Over-the-counter, or "OTC," medications may be effective in relieving mild to moderate pain and associated symptoms of migraine. But you should see your caregiver before beginning any treatment regimen for migraine.  °Q: What are preventive medications for migraine? °A: Preventive medications for migraine are sometimes referred to as "prophylactic" treatments. They are used to reduce the frequency, severity, and length of migraine attacks. Examples of preventive medications include antiepileptic medications, antidepressants, beta-blockers, calcium channel blockers, and NSAIDs (nonsteroidal anti-inflammatory drugs). °Q: Why are anticonvulsants used to treat migraine? °A: During the past few years, there has been an increased interest in antiepileptic drugs for the prevention of migraine. They are sometimes referred to as "anticonvulsants". Both epilepsy and migraine may be caused by similar reactions in the brain.  °Q: Why are antidepressants used to treat migraine? °A: Antidepressants are typically used to treat people with depression. They may reduce migraine frequency by regulating chemical levels, such as serotonin, in the brain.  °Q: What alternative therapies are used to treat migraine? °A: The term "alternative therapies" is often used to describe treatments considered outside the scope of conventional Western medicine. Examples of alternative therapy include acupuncture, acupressure, and yoga. Another common alternative treatment is herbal therapy. Some herbs are believed to relieve headache pain. Always discuss alternative therapies with your caregiver before proceeding. Some herbal products contain arsenic and other toxins. °TENSION HEADACHES °Q: What is a tension-type  headache? What causes it? How can I treat it? °A: Tension-type headaches occur randomly. They are often the result of temporary stress, anxiety, fatigue, or anger. Symptoms include soreness in your temples, a tightening band-like sensation around your head (a "vice-like" ache). Symptoms can also include a pulling feeling, pressure sensations, and contracting head and neck muscles. The headache begins in your forehead, temples, or the back of your head and neck. Treatment for tension-type headache may include over-the-counter or prescription medications. Treatment may also include self-help techniques such as relaxation training and biofeedback. °CLUSTER HEADACHES °Q: What is a cluster headache? What causes it? How can I treat it? °A: Cluster headache gets its name because the attacks come in groups. The pain arrives with little, if any, warning. It is usually on one side of the head. A tearing or bloodshot eye and a runny nose on the same side of the headache may also accompany the pain. Cluster headaches are believed to be caused by chemical reactions in the brain. They have been described as the most severe and intense of any headache type. Treatment for cluster headache includes prescription medication and oxygen. °SINUS HEADACHES °Q: What is a sinus headache? What causes it? How can I treat it? °A: When a cavity in the bones of the face and skull (a sinus) becomes inflamed, the inflammation will cause localized pain. This condition is usually the result of an allergic reaction, a tumor, or an infection. If your headache is caused by a sinus blockage, such as an infection, you will probably have a fever. An x-ray will confirm a sinus blockage. Your caregiver's treatment might include antibiotics for the infection,   as well as antihistamines or decongestants.  REBOUND HEADACHES Q: What is a rebound headache? What causes it? How can I treat it? A: A pattern of taking acute headache medications too often can lead  to a condition known as "rebound headache." A pattern of taking too much headache medication includes taking it more than 2 days per week or in excessive amounts. That means more than the label or a caregiver advises. With rebound headaches, your medications not only stop relieving pain, they actually begin to cause headaches. Doctors treat rebound headache by tapering the medication that is being overused. Sometimes your caregiver will gradually substitute a different type of treatment or medication. Stopping may be a challenge. Regularly overusing a medication increases the potential for serious side effects. Consult a caregiver if you regularly use headache medications more than 2 days per week or more than the label advises. ADDITIONAL QUESTIONS AND ANSWERS Q: What is biofeedback? A: Biofeedback is a self-help treatment. Biofeedback uses special equipment to monitor your body's involuntary physical responses. Biofeedback monitors:  Breathing.  Pulse.  Heart rate.  Temperature.  Muscle tension.  Brain activity. Biofeedback helps you refine and perfect your relaxation exercises. You learn to control the physical responses that are related to stress. Once the technique has been mastered, you do not need the equipment any more. Q: Are headaches hereditary? A: Four out of five (80%) of people that suffer report a family history of migraine. Scientists are not sure if this is genetic or a family predisposition. Despite the uncertainty, a child has a 50% chance of having migraine if one parent suffers. The child has a 75% chance if both parents suffer.  Q: Can children get headaches? A: By the time they reach high school, most young people have experienced some type of headache. Many safe and effective approaches or medications can prevent a headache from occurring or stop it after it has begun.  Q: What type of doctor should I see to diagnose and treat my headache? A: Start with your primary  caregiver. Discuss his or her experience and approach to headaches. Discuss methods of classification, diagnosis, and treatment. Your caregiver may decide to recommend you to a headache specialist, depending upon your symptoms or other physical conditions. Having diabetes, allergies, etc., may require a more comprehensive and inclusive approach to your headache. The National Headache Foundation will provide, upon request, a list of [facility] physician members in your state. Document Released: 05/13/2003 Document Revised: 05/15/2011 Document Reviewed: 10/21/2007 [facility] Patient Information 2015 [place], [place]. This information is not intended to replace advice given to you by your health care provider. Make sure you discuss any questions you have with your health care provider.

## 2014-01-28 ENCOUNTER — Encounter (HOSPITAL_COMMUNITY): Payer: Self-pay | Admitting: Emergency Medicine

## 2014-01-28 ENCOUNTER — Emergency Department (HOSPITAL_COMMUNITY)
Admission: EM | Admit: 2014-01-28 | Discharge: 2014-01-28 | Disposition: A | Discharge status: Home / Self Care | Payer: Medicaid Other | Attending: Emergency Medicine | Admitting: Emergency Medicine

## 2014-01-28 ENCOUNTER — Emergency Department (HOSPITAL_COMMUNITY): Payer: Medicaid Other

## 2014-01-28 DIAGNOSIS — Z87440 Personal history of urinary (tract) infections: Secondary | ICD-10-CM | POA: Diagnosis not present

## 2014-01-28 DIAGNOSIS — Z872 Personal history of diseases of the skin and subcutaneous tissue: Secondary | ICD-10-CM | POA: Insufficient documentation

## 2014-01-28 DIAGNOSIS — Z87891 Personal history of nicotine dependence: Secondary | ICD-10-CM | POA: Diagnosis not present

## 2014-01-28 DIAGNOSIS — G8929 Other chronic pain: Secondary | ICD-10-CM | POA: Insufficient documentation

## 2014-01-28 DIAGNOSIS — Z8719 Personal history of other diseases of the digestive system: Secondary | ICD-10-CM | POA: Diagnosis not present

## 2014-01-28 DIAGNOSIS — Z862 Personal history of diseases of the blood and blood-forming organs and certain disorders involving the immune mechanism: Secondary | ICD-10-CM | POA: Diagnosis not present

## 2014-01-28 DIAGNOSIS — M25562 Pain in left knee: Secondary | ICD-10-CM | POA: Insufficient documentation

## 2014-01-28 DIAGNOSIS — Z8619 Personal history of other infectious and parasitic diseases: Secondary | ICD-10-CM | POA: Diagnosis not present

## 2014-01-28 DIAGNOSIS — M25569 Pain in unspecified knee: Secondary | ICD-10-CM

## 2014-01-28 MED ORDER — KETOROLAC TROMETHAMINE 60 MG/2ML IM SOLN
60.0000 mg | Freq: Once | INTRAMUSCULAR | Status: AC
Start: 2014-01-28 — End: 2014-01-28
  Administered 2014-01-28: 60 mg via INTRAMUSCULAR
  Filled 2014-01-28: qty 2

## 2014-01-28 NOTE — ED Provider Notes (Signed)
CSN: [id number]     Arrival date & time 01/28/14  1312 History  This chart was scribed for non-physician practitioner, Louann SjogrenVictoria L Tatijana Bierly, PA-C, working with Bianca DibblesJon Knapp, MD, by Bronson CurbJacqueline Cantrell, ED Scribe. This patient was seen in room WTR8/WTR8 and the patient's care was started at 1:35 PM.    Chief Complaint  Patient presents with  . Knee Pain    The history is provided by the patient. No language interpreter was used.     HPI Comments: Bianca Cantrell is a 28 y.o. female who presents to the Emergency Department complaining of an exacerbated episode of chronic left knee pain onset 2 days ago. 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 Patient denies any injury or truama prior to onset of pain. She states the pain is worsened when weight bearing and ambulating. She denies numbness/tingling, weakness, fever chills, nausea, or vomiting. [facility]   Past Medical History  Diagnosis Date  . No pertinent past medical history   . Gallstones   . Chlamydia   . Anemia   . Sickle cell trait   . Infection     urinary tract infection  . Eczema   . Abnormal Pap smear     HPV   Past Surgical History  Procedure Laterality Date  . Cholecystectomy  2011  . Appendectomy  2010   Family History  Problem Relation Age of Onset  . Hypertension Mother   . Heart disease Mother   . Hypertension Maternal Grandmother   . Heart disease Maternal Grandmother   . Other Neg Hx   . Cancer Maternal Aunt     leukemia  . Cancer Maternal Uncle     pancreatic   History  Substance Use Topics  . Smoking status: Former Smoker -- 0.25 packs/day for 1 years    Types: Cigarettes    Quit date: 02/15/2013  . Smokeless tobacco: Never Used  . Alcohol Use: No   OB History    Gravida Para Term Preterm AB TAB SAB Ectopic Multiple Living   4 4 4  0 0 0 0 0 0 4     Review of Systems  Constitutional: Negative  for fever and chills.  Gastrointestinal: Negative for nausea and vomiting.  Musculoskeletal: Positive for arthralgias (left knee).  Neurological: Negative for weakness and numbness.      Allergies  Sulfa antibiotics; Sulfur; and Other  Home Medications   Prior to Admission medications   Medication Sig Start Date End Date Taking? Authorizing Provider  etonogestrel (NEXPLANON) 68 MG IMPL implant 1 each by Subdermal route once.   Yes Historical Provider, MD  butalbital-acetaminophen-caffeine (FIORICET) 50-325-40 MG per tablet Take 1-2 tablets by mouth every 6 (six) hours as needed for headache. 01/11/14 01/11/15  Nicole Pisciotta, PA-C   Triage Vitals: BP 113/42 mmHg  Pulse 89  Temp(Src) 98.8 F (37.1 C) (Oral)  Resp 18  SpO2 100%  Physical Exam  Constitutional: She appears well-developed and well-nourished.  HENT:  Head: Normocephalic and atraumatic.  Eyes: Conjunctivae are normal. Right eye exhibits no discharge. Left eye exhibits no discharge.  Cardiovascular:  2+ distal pulses and pedal pulses equal bilaterally.  Pulmonary/Chest: Effort normal.  Musculoskeletal: She exhibits tenderness.  Left knee medial joint line tenderness. Full ROM with pain. No calf tenderness. No redness, swelling, warmth, ligamentous laxity. Negative anterior and posterior drawer test. Normal gait. No lower leg or  thigh tenderness.  Neurological: She is alert. She exhibits normal muscle tone.  Skin: Skin is warm and dry.  Psychiatric: She has a normal mood and affect. Her behavior is normal.  Nursing note and vitals reviewed.   ED Course  Procedures (including critical care time)  DIAGNOSTIC STUDIES: Oxygen Saturation is 100% on room air, normal by my interpretation.    COORDINATION OF CARE: At 1340 Discussed treatment plan with patient which includes imaging and knee sleeve provided x-rays reveal unremarkable findings, and RICE. Patient agrees.   Labs Review Labs Reviewed - No data to  display  Imaging Review Dg Knee Complete 4 Views Left  01/28/2014   CLINICAL DATA:  Left knee pain. No known injury. Several years pain  EXAM: LEFT KNEE - COMPLETE 4+ VIEW  COMPARISON:  None.  FINDINGS: No fracture of the proximal tibia or distal femur. Patella is normal. No joint effusion. There is small amount of gas within the lateral compartment likely related degenerative joint disease.  IMPRESSION: No acute findings left knee. Gas in the lateral aspect knee joint suggests degenerative change. Consider MRI of the.   Electronically Signed   By: Genevive BiStewart  Edmunds M.D.   On: 01/28/2014 14:20     EKG Interpretation None      MDM   Final diagnoses:  Left knee pain   Patient X-Ray negative for obvious fracture or dislocation. Pt with gas in lateral aspect of left knee suggests degenerative change. Pt advised to follow up with orthopedics. Pain managed in ED. Patient given brace while in ED, conservative therapy recommended and discussed. Exam nonconcerning for septic arthritis.  Discussed return precautions with patient. Discussed all results and patient verbalizes understanding and agrees with plan.  Case has been discussed with Dr. Lynelle DoctorKnapp who agrees with the above plan and to discharge.   I personally performed the services described in this documentation, which was scribed in my presence. The recorded information has been reviewed and is accurate.   Louann SjogrenVictoria L Bianca Lenderman, PA-C 01/28/14 2257  Bianca DibblesJon Knapp, MD 01/30/14 [phone]

## 2014-01-28 NOTE — ED Notes (Signed)
Pt ambulatory w/o assistance to triage room complete with laptop computer and charger.  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  States that she has been having lt knee pain x 2 days.  Denies injury.

## 2014-01-28 NOTE — ED Notes (Signed)
Ortho called 

## 2014-01-28 NOTE — Discharge Instructions (Signed)
Return to the emergency room with worsening of symptoms, new symptoms or with symptoms that are concerning, especially fevers, chills, nausea, vomiting, redness, swelling, red streaks, severe pain. RICE: Rest, Ice (three cycles of 20 mins on, 20mins off at least twice a day), compression/brace, elevation. Heating pad works well for back pain. Ibuprofen 400mg  (2 tablets 200mg ) every 5-6 hours for 3-5 days and then as needed for pain. Follow up with orthopedics as soon as possible. Number provided above. Call for appointment.   Arthritis, Nonspecific Arthritis is inflammation of a joint. This usually means pain, redness, warmth or swelling are present. One or more joints may be involved. There are a number of types of arthritis. Your caregiver may not be able to tell what type of arthritis you have right away. CAUSES  The most common cause of arthritis is the wear and tear on the joint (osteoarthritis). This causes damage to the cartilage, which can break down over time. The knees, hips, back and neck are most often affected by this type of arthritis. Other types of arthritis and common causes of joint pain include:  Sprains and other injuries near the joint. Sometimes minor sprains and injuries cause pain and swelling that develop hours later.  Rheumatoid arthritis. This affects hands, feet and knees. It usually affects both sides of your body at the same time. It is often associated with chronic ailments, fever, weight loss and general weakness.  Crystal arthritis. Gout and pseudo gout can cause occasional acute severe pain, redness and swelling in the foot, ankle, or knee.  Infectious arthritis. Bacteria can get into a joint through a break in overlying skin. This can cause infection of the joint. Bacteria and viruses can also spread through the blood and affect your joints.  Drug, infectious and allergy reactions. Sometimes joints can become mildly painful and slightly swollen with these types  of illnesses. SYMPTOMS   Pain is the main symptom.  Your joint or joints can also be red, swollen and warm or hot to the touch.  You may have a fever with certain types of arthritis, or even feel overall ill.  The joint with arthritis will hurt with movement. Stiffness is present with some types of arthritis. DIAGNOSIS  Your caregiver will suspect arthritis based on your description of your symptoms and on your exam. Testing may be needed to find the type of arthritis:  Blood and sometimes urine tests.  X-ray tests and sometimes CT or MRI scans.  Removal of fluid from the joint (arthrocentesis) is done to check for bacteria, crystals or other causes. Your caregiver (or a specialist) will numb the area over the joint with a local anesthetic, and use a needle to remove joint fluid for examination. This procedure is only minimally uncomfortable.  Even with these tests, your caregiver may not be able to tell what kind of arthritis you have. Consultation with a specialist (rheumatologist) may be helpful. TREATMENT  Your caregiver will discuss with you treatment specific to your type of arthritis. If the specific type cannot be determined, then the following general recommendations may apply. Treatment of severe joint pain includes:  Rest.  Elevation.  Anti-inflammatory medication (for example, ibuprofen) may be prescribed. Avoiding activities that cause increased pain.  Only take over-the-counter or prescription medicines for pain and discomfort as recommended by your caregiver.  Cold packs over an inflamed joint may be used for 10 to 15 minutes every hour. Hot packs sometimes feel better, but do not use overnight. Do not  use hot packs if you are diabetic without your caregiver's permission.  A cortisone shot into arthritic joints may help reduce pain and swelling.  Any acute arthritis that gets worse over the next 1 to 2 days needs to be looked at to be sure there is no joint  infection. Long-term arthritis treatment involves modifying activities and lifestyle to reduce joint stress jarring. This can include weight loss. Also, exercise is needed to nourish the joint cartilage and remove waste. This helps keep the muscles around the joint strong. HOME CARE INSTRUCTIONS   Do not take aspirin to relieve pain if gout is suspected. This elevates uric acid levels.  Only take over-the-counter or prescription medicines for pain, discomfort or fever as directed by your caregiver.  Rest the joint as much as possible.  If your joint is swollen, keep it elevated.  Use crutches if the painful joint is in your leg.  Drinking plenty of fluids may help for certain types of arthritis.  Follow your caregiver's dietary instructions.  Try low-impact exercise such as:  Swimming.  Water aerobics.  Biking.  Walking.  Morning stiffness is often relieved by a warm shower.  Put your joints through regular range-of-motion. SEEK MEDICAL CARE IF:   You do not feel better in 24 hours or are getting worse.  You have side effects to medications, or are not getting better with treatment. SEEK IMMEDIATE MEDICAL CARE IF:   You have a fever.  You develop severe joint pain, swelling or redness.  Many joints are involved and become painful and swollen.  There is severe back pain and/or leg weakness.  You have loss of bowel or bladder control. Document Released: 03/30/2004 Document Revised: 05/15/2011 Document Reviewed: 04/15/2008 [facility] Patient Information 2015 [place]. This information is not intended to replace advice given to you by your health care provider. Make sure you discuss any questions you have with your health care provider.

## 2014-02-12 ENCOUNTER — Encounter: Payer: Self-pay | Admitting: Obstetrics

## 2014-02-12 ENCOUNTER — Ambulatory Visit (INDEPENDENT_AMBULATORY_CARE_PROVIDER_SITE_OTHER): Payer: Medicaid Other | Admitting: Obstetrics

## 2014-02-12 VITALS — BP 131/83 | HR 105 | Temp 99.2°F | Ht 58.5 in | Wt 243.0 lb

## 2014-02-12 DIAGNOSIS — A499 Bacterial infection, unspecified: Secondary | ICD-10-CM

## 2014-02-12 DIAGNOSIS — B9689 Other specified bacterial agents as the cause of diseases classified elsewhere: Secondary | ICD-10-CM

## 2014-02-12 DIAGNOSIS — N760 Acute vaginitis: Secondary | ICD-10-CM

## 2014-02-12 MED ORDER — METRONIDAZOLE 500 MG PO TABS: 500.0000 mg | ORAL_TABLET | Freq: Two times a day (BID) | ORAL | Status: DC

## 2014-02-13 ENCOUNTER — Encounter: Payer: Self-pay | Admitting: Obstetrics

## 2014-02-13 NOTE — Progress Notes (Signed)
Results given to patient by nurse.  Patient not seen by physician.  Coral Ceoharles Harper MD

## 2014-03-04 ENCOUNTER — Emergency Department (HOSPITAL_COMMUNITY)
Admission: EM | Admit: 2014-03-04 | Discharge: 2014-03-04 | Disposition: A | Discharge status: Home / Self Care | Payer: Medicaid Other | Attending: Emergency Medicine | Admitting: Emergency Medicine

## 2014-03-04 ENCOUNTER — Encounter (HOSPITAL_COMMUNITY): Payer: Self-pay | Admitting: Emergency Medicine

## 2014-03-04 DIAGNOSIS — Z862 Personal history of diseases of the blood and blood-forming organs and certain disorders involving the immune mechanism: Secondary | ICD-10-CM | POA: Insufficient documentation

## 2014-03-04 DIAGNOSIS — Z87891 Personal history of nicotine dependence: Secondary | ICD-10-CM | POA: Insufficient documentation

## 2014-03-04 DIAGNOSIS — K5900 Constipation, unspecified: Secondary | ICD-10-CM | POA: Diagnosis not present

## 2014-03-04 DIAGNOSIS — R1030 Lower abdominal pain, unspecified: Secondary | ICD-10-CM

## 2014-03-04 DIAGNOSIS — Z792 Long term (current) use of antibiotics: Secondary | ICD-10-CM | POA: Diagnosis not present

## 2014-03-04 DIAGNOSIS — Z872 Personal history of diseases of the skin and subcutaneous tissue: Secondary | ICD-10-CM | POA: Diagnosis not present

## 2014-03-04 DIAGNOSIS — Z87440 Personal history of urinary (tract) infections: Secondary | ICD-10-CM | POA: Insufficient documentation

## 2014-03-04 DIAGNOSIS — Z3202 Encounter for pregnancy test, result negative: Secondary | ICD-10-CM | POA: Diagnosis not present

## 2014-03-04 DIAGNOSIS — Z8619 Personal history of other infectious and parasitic diseases: Secondary | ICD-10-CM | POA: Diagnosis not present

## 2014-03-04 LAB — URINALYSIS, ROUTINE W REFLEX MICROSCOPIC
BILIRUBIN URINE: NEGATIVE
Glucose, UA: NEGATIVE mg/dL
HGB URINE DIPSTICK: NEGATIVE
KETONES UR: NEGATIVE mg/dL
Leukocytes, UA: NEGATIVE
NITRITE: NEGATIVE
PROTEIN: NEGATIVE mg/dL
Specific Gravity, Urine: 1.015 (ref 1.005–1.030)
UROBILINOGEN UA: 1 mg/dL (ref 0.0–1.0)
pH: 6 (ref 5.0–8.0)

## 2014-03-04 LAB — PREGNANCY, URINE: PREG TEST UR: NEGATIVE

## 2014-03-04 MED ORDER — POLYETHYLENE GLYCOL 3350 17 G PO PACK: 17.0000 g | PACK | Freq: Every day | ORAL | Status: DC

## 2014-03-04 NOTE — ED Notes (Signed)
Pt c/o mid abd pain that occurs after she urinates. Pt states that the pain has been going on for months and she has seen a specialist and was told that the organ underneather her breast bone was inflamed and she wants to know how to get it uninflammable or what she can do about the pain.

## 2014-03-04 NOTE — ED Provider Notes (Signed)
CSN: [id number]     Arrival date & time 03/04/14  1616 History   First MD Initiated Contact with Patient 03/04/14 1832     Chief Complaint  Patient presents with  . Abdominal Pain     (Consider location/radiation/quality/duration/timing/severity/associated sxs/prior Treatment) HPI The patient poor she's had suprapubic discomfort for several days. She has a fullness and burning type sensation. She reports it does not burn when she urinates however she does feel that she is going more often. She also notes that she's had increased difficulty passing stool. She reports that she strains and then the stool is firm and small. She has not had any fever. She denies vaginal discharge or abnormal bleeding. The patient reports she has not been sexually active for 8 months. She did have a gonorrhea chlamydia test done at her gynecologist which was negative. She has not had any fever. No vomiting. Past Medical History  Diagnosis Date  . No pertinent past medical history   . Gallstones   . Chlamydia   . Anemia   . Sickle cell trait   . Infection     urinary tract infection  . Eczema   . Abnormal Pap smear     HPV   Past Surgical History  Procedure Laterality Date  . Cholecystectomy  2011  . Appendectomy  2010   Family History  Problem Relation Age of Onset  . Hypertension Mother   . Heart disease Mother   . Hypertension Maternal Grandmother   . Heart disease Maternal Grandmother   . Other Neg Hx   . Cancer Maternal Aunt     leukemia  . Cancer Maternal Uncle     pancreatic   History  Substance Use Topics  . Smoking status: Former Smoker -- 0.25 packs/day for 1 years    Types: Cigarettes    Quit date: 02/15/2013  . Smokeless tobacco: Never Used  . Alcohol Use: No   OB History    Gravida Para Term Preterm AB TAB SAB Ectopic Multiple Living   4 4 4  0 0 0 0 0 0 4     Review of Systems  10 Systems reviewed and are negative for acute change except as noted in the  HPI.   Allergies  Sulfa antibiotics; Sulfur; and Other  Home Medications   Prior to Admission medications   Medication Sig Start Date End Date Taking? Authorizing Provider  butalbital-acetaminophen-caffeine (FIORICET) 50-325-40 MG per tablet Take 1-2 tablets by mouth every 6 (six) hours as needed for headache. 01/11/14 01/11/15  Nicole Pisciotta, PA-C  etonogestrel (NEXPLANON) 68 MG IMPL implant 1 each by Subdermal route once.    Historical Provider, MD  metroNIDAZOLE (FLAGYL) 500 MG tablet Take 1 tablet (500 mg total) by mouth 2 (two) times daily. Patient not taking: Reported on 03/04/2014 02/12/14   Brock Badharles A Harper, MD  polyethylene glycol [facility](MIRALAX / Ethelene HalGLYCOLAX) packet Take 17 g by mouth daily. 03/04/14   Arby BarretteMarcy Lonzy Mato, MD   BP 116/69 mmHg  Pulse 85  Temp(Src) 97.8 F (36.6 C) (Oral)  Resp 18  SpO2 98% Physical Exam  Constitutional: She is oriented to person, place, and time. She appears well-developed and well-nourished.  HENT:  Head: Normocephalic and atraumatic.  Eyes: EOM are normal. Pupils are equal, round, and reactive to light.  Neck: Neck supple.  Cardiovascular: Normal rate, regular rhythm, normal heart sounds and intact distal pulses.   Pulmonary/Chest: Effort normal and breath sounds normal.  Abdominal: Soft. Bowel sounds are normal. She exhibits  no distension. There is tenderness.  Patient endorses mild discomfort to deep palpation in the suprapubic region. No guarding no rebound no mass. No pain in the inguinal canals. No pain in the upper or lateral aspects of the abdomen.  Musculoskeletal: Normal range of motion. She exhibits no edema.  Neurological: She is alert and oriented to person, place, and time. She has normal strength. Coordination normal. GCS eye subscore is 4. GCS verbal subscore is 5. GCS motor subscore is 6.  Skin: Skin is warm, dry and intact.  Psychiatric: She has a normal mood and affect.    ED Course  Procedures (including critical care  time) Labs Review Labs Reviewed  PREGNANCY, URINE  URINALYSIS, ROUTINE W REFLEX MICROSCOPIC    Imaging Review No results found.   EKG Interpretation None      MDM   Final diagnoses:  Lower abdominal pain  Constipation, unspecified constipation type   The patient is well in appearance with a benign abdominal examination. She denies abnormal vaginal discharge or drainage. She denies sexual activity for approximate 8 months. She does have a recent GC chlamydia documented that was negative. The patient expresses a central abdominal discomfort and as well increased straining at stool and hard firm stools. The urinalysis is negative at this time and the pregnancy test is negative. The patient will be treated with me relax for possible constipation related discomfort and advised to follow-up with her family physician.    Arby BarretteMarcy Mystique Bjelland, MD 03/04/14 2137

## 2014-03-04 NOTE — Discharge Instructions (Signed)
Abdominal Pain, Women °Abdominal (stomach, pelvic, or belly) pain can be caused by many things. It is important to tell your doctor: °· The location of the pain. °· Does it come and go or is it present all the time? °· Are there things that start the pain (eating certain foods, exercise)? °· Are there other symptoms associated with the pain (fever, nausea, vomiting, diarrhea)? °All of this is helpful to know when trying to find the cause of the pain. °CAUSES  °· Stomach: virus or bacteria infection, or ulcer. °· Intestine: appendicitis (inflamed appendix), regional ileitis (Crohn's disease), ulcerative colitis (inflamed colon), irritable bowel syndrome, diverticulitis (inflamed diverticulum of the colon), or cancer of the stomach or intestine. °· Gallbladder disease or stones in the gallbladder. °· Kidney disease, kidney stones, or infection. °· Pancreas infection or cancer. °· Fibromyalgia (pain disorder). °· Diseases of the female organs: °¨ Uterus: fibroid (non-cancerous) tumors or infection. °¨ Fallopian tubes: infection or tubal pregnancy. °¨ Ovary: cysts or tumors. °¨ Pelvic adhesions (scar tissue). °¨ Endometriosis (uterus lining tissue growing in the pelvis and on the pelvic organs). °¨ Pelvic congestion syndrome (female organs filling up with blood just before the menstrual period). °¨ Pain with the menstrual period. °¨ Pain with ovulation (producing an egg). °¨ Pain with an IUD (intrauterine device, birth control) in the uterus. °¨ Cancer of the female organs. °· Functional pain (pain not caused by a disease, may improve without treatment). °· Psychological pain. °· Depression. °DIAGNOSIS  °Your doctor will decide the seriousness of your pain by doing an examination. °· Blood tests. °· X-rays. °· Ultrasound. °· CT scan (computed tomography, special type of X-ray). °· MRI (magnetic resonance imaging). °· Cultures, for infection. °· Barium enema (dye inserted in the large intestine, to better view it with  X-rays). °· Colonoscopy (looking in intestine with a lighted tube). °· Laparoscopy (minor surgery, looking in abdomen with a lighted tube). °· Major abdominal exploratory surgery (looking in abdomen with a large incision). °TREATMENT  °The treatment will depend on the cause of the pain.  °· Many cases can be observed and treated at home. °· Over-the-counter medicines recommended by your caregiver. °· Prescription medicine. °· Antibiotics, for infection. °· Birth control pills, for painful periods or for ovulation pain. °· Hormone treatment, for endometriosis. °· Nerve blocking injections. °· Physical therapy. °· Antidepressants. °· Counseling with a psychologist or psychiatrist. °· Minor or major surgery. °HOME CARE INSTRUCTIONS  °· Do not take laxatives, unless directed by your caregiver. °· Take over-the-counter pain medicine only if ordered by your caregiver. Do not take aspirin because it can cause an upset stomach or bleeding. °· Try a clear liquid diet (broth or water) as ordered by your caregiver. Slowly move to a bland diet, as tolerated, if the pain is related to the stomach or intestine. °· Have a thermometer and take your temperature several times a day, and record it. °· Bed rest and sleep, if it helps the pain. °· Avoid sexual intercourse, if it causes pain. °· Avoid stressful situations. °· Keep your follow-up appointments and tests, as your caregiver orders. °· If the pain does not go away with medicine or surgery, you may try: °¨ Acupuncture. °¨ Relaxation exercises (yoga, meditation). °¨ Group therapy. °¨ Counseling. °SEEK MEDICAL CARE IF:  °· You notice certain foods cause stomach pain. °· Your home care treatment is not helping your pain. °· You need stronger pain medicine. °· You want your IUD removed. °· You feel faint or   lightheaded. °· You develop nausea and vomiting. °· You develop a rash. °· You are having side effects or an allergy to your medicine. °SEEK IMMEDIATE MEDICAL CARE IF:  °· Your  pain does not go away or gets worse. °· You have a fever. °· Your pain is felt only in portions of the abdomen. The right side could possibly be appendicitis. The left lower portion of the abdomen could be colitis or diverticulitis. °· You are passing blood in your stools (bright red or black tarry stools, with or without vomiting). °· You have blood in your urine. °· You develop chills, with or without a fever. °· You pass out. °MAKE SURE YOU:  °· Understand these instructions. °· Will watch your condition. °· Will get help right away if you are not doing well or get worse. °Document Released: 12/18/2006 Document Revised: 07/07/2013 Document Reviewed: 01/07/2009 °Exit[employer]® Patient Information ©2015 [employer]. This information is not intended to replace advice given to you by your health care provider. Make sure you discuss any questions you have with your health care provider. ° °Constipation °Constipation is when a person has fewer than three bowel movements a week, has difficulty having a bowel movement, or has stools that are dry, hard, or larger than normal. As people grow older, constipation is more common. If you try to fix constipation with medicines that make you have a bowel movement (laxatives), the problem may get worse. Long-term laxative use may cause the muscles of the colon to become weak. A low-fiber diet, not taking in enough fluids, and taking certain medicines may make constipation worse.  °CAUSES  °· Certain medicines, such as antidepressants, pain medicine, iron supplements, antacids, and water pills.   °· Certain diseases, such as diabetes, irritable bowel syndrome (IBS), thyroid disease, or depression.   °· Not drinking enough water.   °· Not eating enough fiber-rich foods.   °· Stress or travel.   °· Lack of physical activity or exercise.   °· Ignoring the urge to have a bowel movement.   °· Using laxatives too much.   °SIGNS AND SYMPTOMS  °· Having fewer than three bowel movements a  week.   °· Straining to have a bowel movement.   °· Having stools that are hard, dry, or larger than normal.   °· Feeling full or bloated.   °· Pain in the lower abdomen.   °· Not feeling relief after having a bowel movement.   °DIAGNOSIS  °Your health care provider will take a medical history and perform a physical exam. Further testing may be done for severe constipation. Some tests may include: °· A barium enema X-ray to examine your rectum, colon, and, sometimes, your small intestine.   °· A sigmoidoscopy to examine your lower colon.   °· A colonoscopy to examine your entire colon. °TREATMENT  °Treatment will depend on the severity of your constipation and what is causing it. Some dietary treatments include drinking more fluids and eating more fiber-rich foods. Lifestyle treatments may include regular exercise. If these diet and lifestyle recommendations do not help, your health care provider may recommend taking over-the-counter laxative medicines to help you have bowel movements. Prescription medicines may be prescribed if over-the-counter medicines do not work.  °HOME CARE INSTRUCTIONS  °· Eat foods that have a lot of fiber, such as fruits, vegetables, whole grains, and beans. °· Limit foods high in fat and processed sugars, such as french fries, hamburgers, cookies, candies, and soda.   °· A fiber supplement may be added to your diet if you cannot get enough fiber from foods.   °·   Drink enough fluids to keep your urine clear or pale yellow.   °· Exercise regularly or as directed by your health care provider.   °· Go to the restroom when you have the urge to go. Do not hold it.   °· Only take over-the-counter or prescription medicines as directed by your health care provider. Do not take other medicines for constipation without talking to your health care provider first.   °SEEK IMMEDIATE MEDICAL CARE IF:  °· You have bright red blood in your stool.   °· Your constipation lasts for more than 4 days or gets  worse.   °· You have abdominal or rectal pain.   °· You have thin, pencil-like stools.   °· You have unexplained weight loss. °MAKE SURE YOU:  °· Understand these instructions. °· Will watch your condition. °· Will get help right away if you are not doing well or get worse. °Document Released: 11/19/2003 Document Revised: 02/25/2013 Document Reviewed: 12/02/2012 °ExitCare® Patient Information ©2015 [employer]. This information is not intended to replace advice given to you by your health care provider. Make sure you discuss any questions you have with your health care provider. ° °

## 2014-03-04 NOTE — ED Notes (Signed)
MD Pfieffer at bedside.

## 2014-03-19 ENCOUNTER — Encounter (HOSPITAL_COMMUNITY): Payer: Self-pay | Admitting: Family Medicine

## 2014-06-03 IMAGING — US US CHEST/MEDIASTINUM
1 series · 14 of 15 positions shown · non-contrast
Comparison: 01/12/2013.

CLINICAL DATA: Chest/epigastric soft tissue swelling.

EXAM:
CHEST ULTRASOUND

[Series 1: us chest/mediastinum · 0.07mm/px · 14 of 15 slices shown]
[im 1/15]
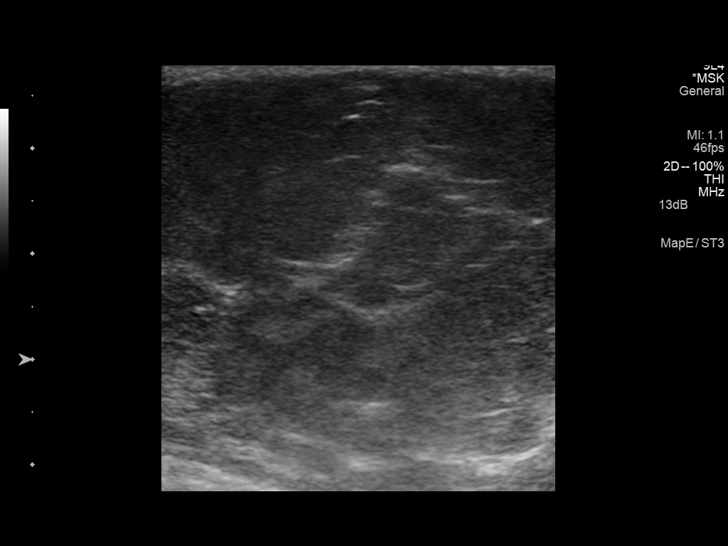
[im 2/15]
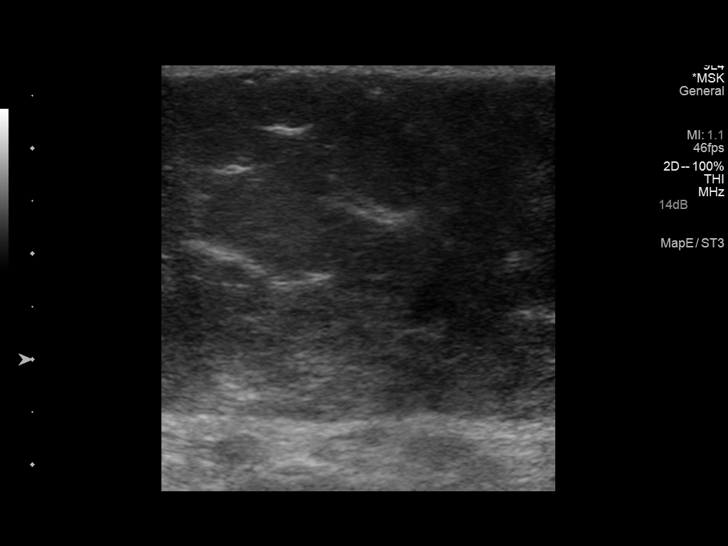
[im 3/15]
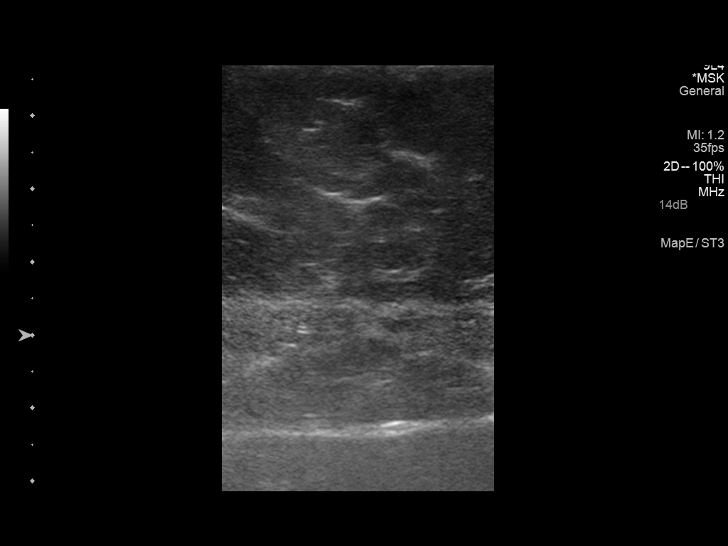
[im 4/15]
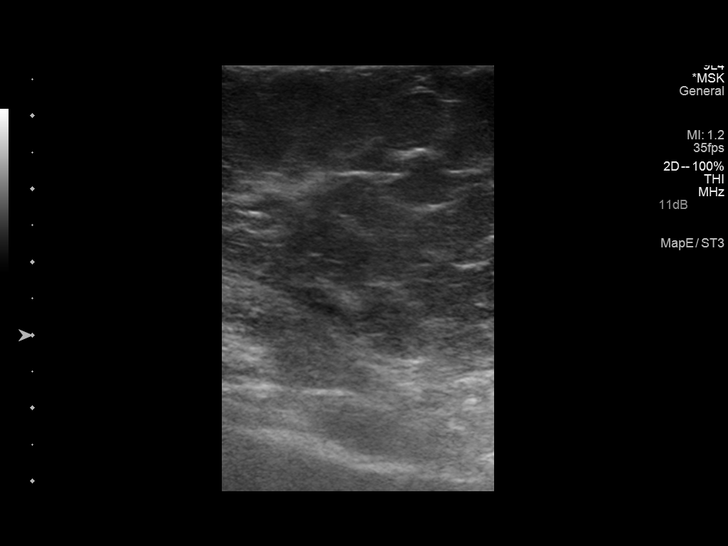
[im 5/15]
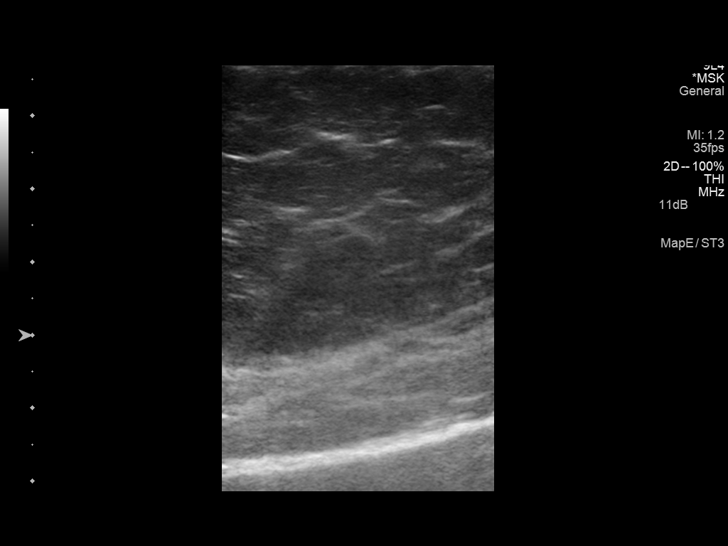
[im 6/15]
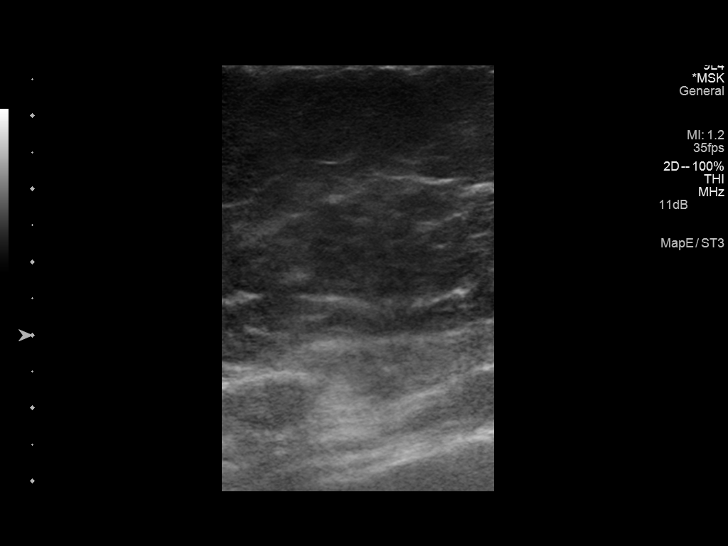
[im 7/15]
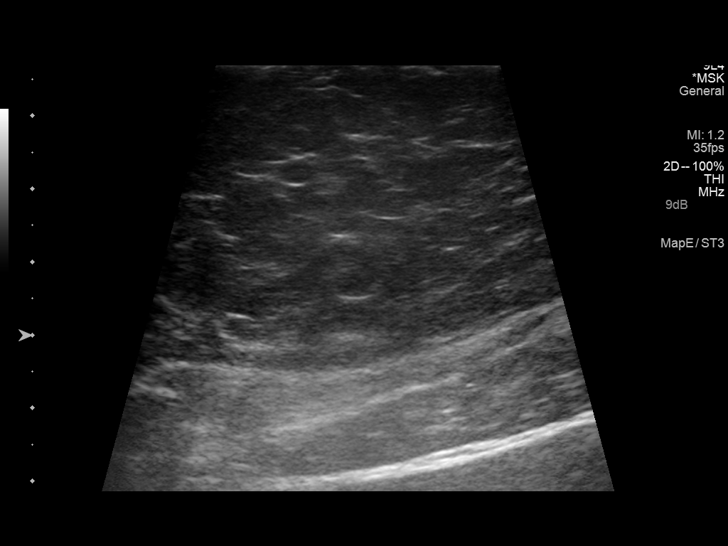
[im 9/15]
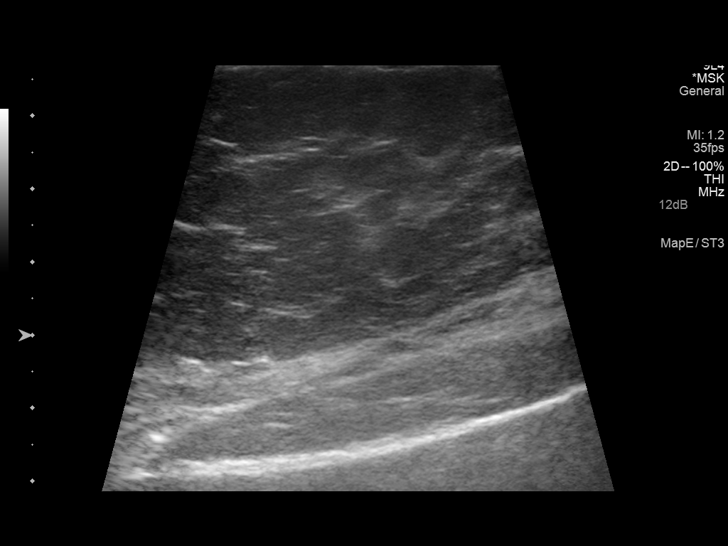
[im 10/15]
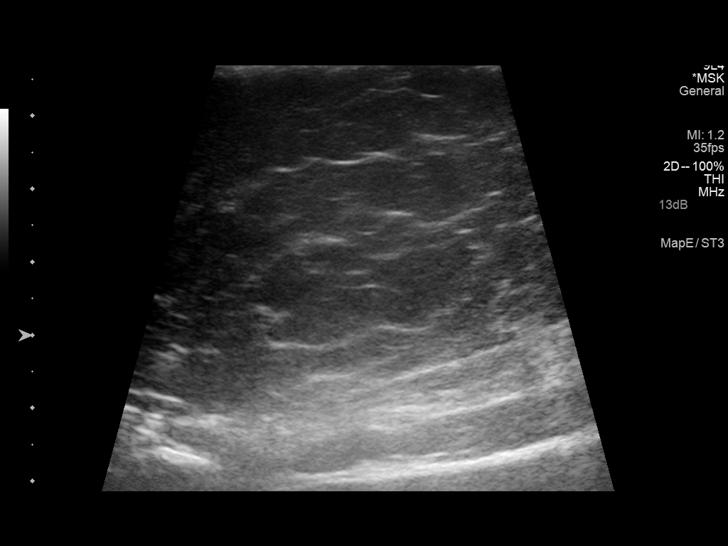
[im 11/15]
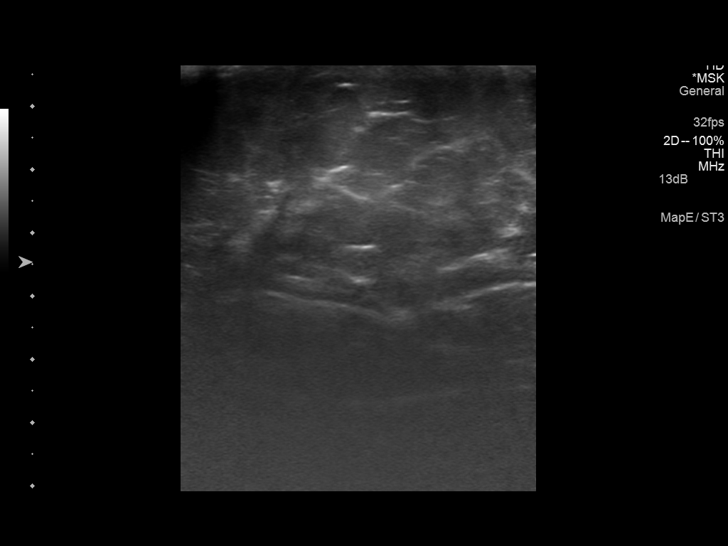
[im 12/15]
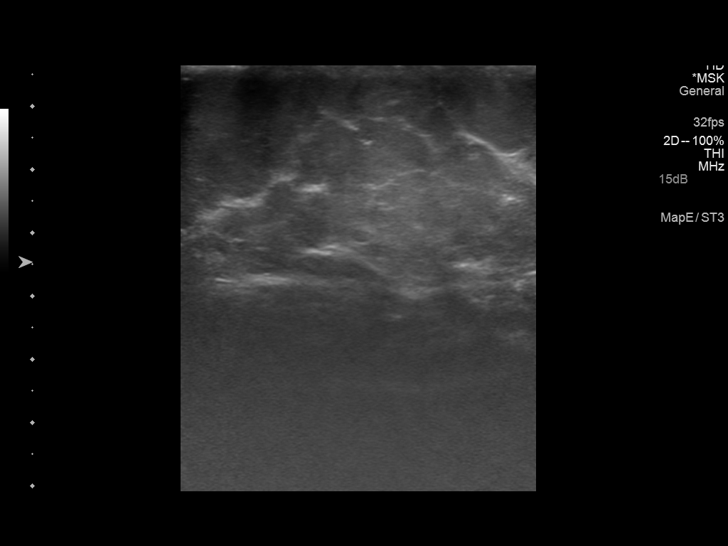
[im 13/15]
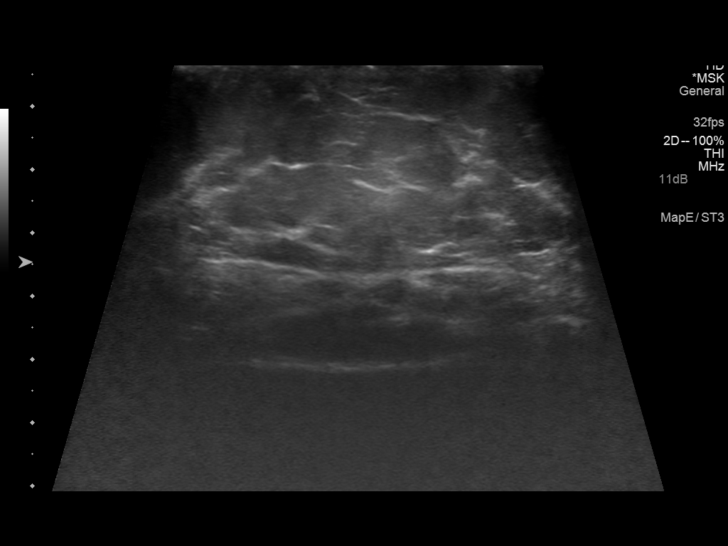
[im 14/15]
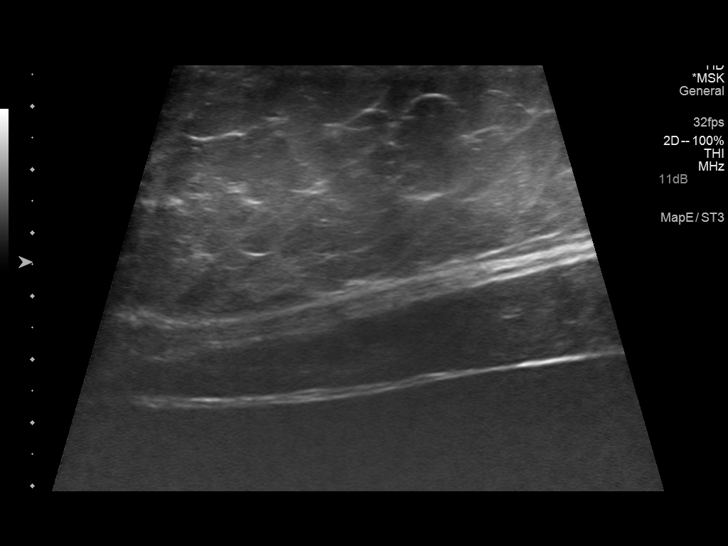
[im 15/15]
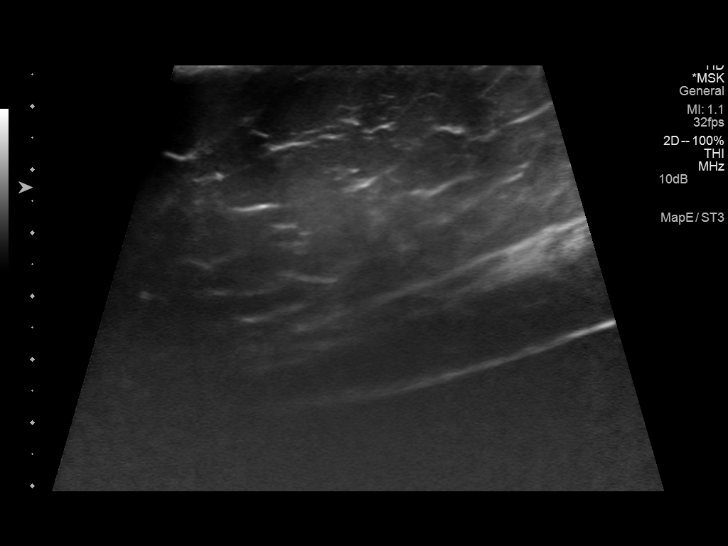

[14 of 15 positions shown; findings below may reference images not displayed]

FINDINGS: In the anterior chest wall region where patient has a palpable
abnormality, there is an appearance of prominent fat measuring up to
4 cm in thickness. No mass is seen separate from this region. If
further delineation were clinically desired, MR Per-Lennart then be
considered.
IMPRESSION: Prominent subcutaneous fat seen at level of palpable abnormality
involving the anterior lower chest wall region as noted above.

## 2014-06-17 ENCOUNTER — Emergency Department (HOSPITAL_COMMUNITY): Payer: Medicaid Other

## 2014-06-17 ENCOUNTER — Emergency Department (HOSPITAL_COMMUNITY)
Admission: EM | Admit: 2014-06-17 | Discharge: 2014-06-17 | Disposition: A | Discharge status: Home / Self Care | Payer: Medicaid Other | Attending: Emergency Medicine | Admitting: Emergency Medicine

## 2014-06-17 DIAGNOSIS — Z79899 Other long term (current) drug therapy: Secondary | ICD-10-CM | POA: Insufficient documentation

## 2014-06-17 DIAGNOSIS — R09A2 Foreign body sensation, throat: Secondary | ICD-10-CM

## 2014-06-17 DIAGNOSIS — T189XXA Foreign body of alimentary tract, part unspecified, initial encounter: Secondary | ICD-10-CM

## 2014-06-17 DIAGNOSIS — Z8619 Personal history of other infectious and parasitic diseases: Secondary | ICD-10-CM | POA: Insufficient documentation

## 2014-06-17 DIAGNOSIS — R0989 Other specified symptoms and signs involving the circulatory and respiratory systems: Secondary | ICD-10-CM | POA: Insufficient documentation

## 2014-06-17 DIAGNOSIS — X58XXXA Exposure to other specified factors, initial encounter: Secondary | ICD-10-CM | POA: Insufficient documentation

## 2014-06-17 DIAGNOSIS — Y998 Other external cause status: Secondary | ICD-10-CM | POA: Insufficient documentation

## 2014-06-17 DIAGNOSIS — Z872 Personal history of diseases of the skin and subcutaneous tissue: Secondary | ICD-10-CM | POA: Insufficient documentation

## 2014-06-17 DIAGNOSIS — Z862 Personal history of diseases of the blood and blood-forming organs and certain disorders involving the immune mechanism: Secondary | ICD-10-CM | POA: Insufficient documentation

## 2014-06-17 DIAGNOSIS — Y9289 Other specified places as the place of occurrence of the external cause: Secondary | ICD-10-CM | POA: Insufficient documentation

## 2014-06-17 DIAGNOSIS — Y9389 Activity, other specified: Secondary | ICD-10-CM | POA: Insufficient documentation

## 2014-06-17 DIAGNOSIS — R198 Other specified symptoms and signs involving the digestive system and abdomen: Secondary | ICD-10-CM

## 2014-06-17 DIAGNOSIS — M549 Dorsalgia, unspecified: Secondary | ICD-10-CM | POA: Insufficient documentation

## 2014-06-17 DIAGNOSIS — Z87440 Personal history of urinary (tract) infections: Secondary | ICD-10-CM | POA: Insufficient documentation

## 2014-06-17 DIAGNOSIS — K228 Other specified diseases of esophagus: Secondary | ICD-10-CM

## 2014-06-17 DIAGNOSIS — Z87891 Personal history of nicotine dependence: Secondary | ICD-10-CM | POA: Insufficient documentation

## 2014-06-17 MED ORDER — SUCRALFATE 1 G PO TABS
1.0000 g | ORAL_TABLET | Freq: Once | ORAL | Status: AC
  Administered 2014-06-17: 1 g via ORAL
  Filled 2014-06-17: qty 1

## 2014-06-17 NOTE — ED Provider Notes (Signed)
CSN: [id number]     Arrival date & time 06/17/14  0335 History   First MD [id number] Contact with Patient 06/17/14 0352     No chief complaint on file.    (Consider location/radiation/quality/duration/timing/severity/associated sxs/prior Treatment) HPI Comments: Patient states she was eating steak and potatoes rapidly during study, when she suddenly felt like something was stuck in her throat.  This is happened before.  She is actually pointing to her mid esophagus as she saying her throat.  She is speaking in full sentences.  She is not drooling.  She is able to drink water.  She states the pain radiates to her back and is worse when she is laying flat  The history is provided by the patient.    Past Medical History  Diagnosis Date  . No pertinent past medical history   . Gallstones   . Chlamydia   . Anemia   . Sickle cell trait   . Infection     urinary tract infection  . Eczema   . Abnormal Pap smear     HPV   Past Surgical History  Procedure Laterality Date  . Cholecystectomy  2011  . Appendectomy  2010   Family History  Problem Relation Age of Onset  . Hypertension Mother   . Heart disease Mother   . Hypertension Maternal Grandmother   . Heart disease Maternal Grandmother   . Other Neg Hx   . Cancer Maternal Aunt     leukemia  . Cancer Maternal Uncle     pancreatic   History  Substance Use Topics  . Smoking status: Former Smoker -- 0.25 packs/day for 1 years    Types: Cigarettes    Quit date: 02/15/2013  . Smokeless tobacco: Never Used  . Alcohol Use: No   OB History    Gravida Para Term Preterm AB TAB SAB Ectopic Multiple Living   4 4 4  0 0 0 0 0 0 4     Review of Systems  Constitutional: Negative for fever.  Respiratory: Negative for shortness of breath and wheezing.   Cardiovascular: Negative for chest pain.  Gastrointestinal: Positive for abdominal pain. Negative for abdominal distention.  Musculoskeletal: Positive for back pain.  Skin: Negative  for rash.  All other systems reviewed and are negative.     Allergies  Sulfa antibiotics; Sulfur; and Other  Home Medications   Prior to Admission medications   Medication Sig Start Date End Date Taking? Authorizing Provider  acetaminophen (TYLENOL) 500 MG tablet Take 500 mg by mouth every 6 (six) hours as needed for mild pain.   Yes Historical Provider, MD  ibuprofen (ADVIL,MOTRIN) 200 MG tablet Take 400 mg by mouth every 6 (six) hours as needed for moderate pain.   Yes Historical Provider, MD  butalbital-acetaminophen-caffeine (FIORICET) 50-325-40 MG per tablet Take 1-2 tablets by mouth every 6 (six) hours as needed for headache. 01/11/14 01/11/15  Nicole Pisciotta, PA-C  etonogestrel (NEXPLANON) 68 MG IMPL implant 1 each by Subdermal route once.    Historical Provider, MD  metroNIDAZOLE (FLAGYL) 500 MG tablet Take 1 tablet (500 mg total) by mouth 2 (two) times daily. Patient not taking: Reported on 03/04/2014 02/12/14   Brock Badharles A Harper, MD  polyethylene glycol [facility](MIRALAX / Ethelene HalGLYCOLAX) packet Take 17 g by mouth daily. Patient not taking: Reported on 06/17/2014 03/04/14   Arby BarretteMarcy Pfeiffer, MD   There were no vitals taken for this visit. Physical Exam  Constitutional: She is oriented to person, place, and time. She  appears well-developed and well-nourished.  HENT:  Head: Normocephalic.  Mouth/Throat: Oropharynx is clear and moist.  Eyes: Pupils are equal, round, and reactive to light.  Neck: Normal range of motion.  Cardiovascular: Normal rate and regular rhythm.   Pulmonary/Chest: Effort normal.  Neurological: She is alert and oriented to person, place, and time.  Skin: Skin is warm. No rash noted.  Nursing note and vitals reviewed.   ED Course  Procedures (including critical care time) Labs Review Labs Reviewed - No data to display  Imaging Review Dg Chest 2 View  06/17/2014   CLINICAL DATA:  Globus sensation for 1 day. History of sickle cell trait.  EXAM: CHEST  2 VIEW   COMPARISON:  None.  FINDINGS: Cardiomediastinal silhouette is unremarkable. Mild bronchitic changes. The lungs are clear without pleural effusions or focal consolidations. Trachea projects midline and there is no pneumothorax. Soft tissue planes and included osseous structures are non-suspicious. Large body habitus. Surgical clips in the included right abdomen likely reflect cholecystectomy.  IMPRESSION: Mild bronchitic changes without focal consolidation. No radiopaque foreign bodies.   Electronically Signed   By: Awilda Metro   On: 06/17/2014 04:25     EKG Interpretation None     [address] patient's plain at this time.  She can follow-up with her primary care physician.  She was in no respiratory stress.  She is swallowing her own saliva  MDM   Final diagnoses:  Foreign body in alimentary tract  Sensation of foreign body in esophagus         Earley Favor, NP 06/17/14 0981  Marisa Severin, MD 06/17/14 [phone]

## 2014-06-17 NOTE — ED Notes (Signed)
See downtime charting. 

## 2014-06-17 NOTE — Discharge Instructions (Signed)
Chest x-ray is normal, showing no foreign body in your esophagus you have been given a dose of Carafate which will coat your esophagus.  Please follow-up with your primary care physician as there is no acute need for further studies in the emergency room tonight

## 2014-06-17 NOTE — ED Notes (Signed)
Pt startes she fels something in her throat and that the sensation began after eating steak her grandmother had using a new seasoning.

## 2014-07-08 ENCOUNTER — Encounter (HOSPITAL_COMMUNITY): Payer: Self-pay

## 2014-07-08 ENCOUNTER — Emergency Department (HOSPITAL_COMMUNITY)
Admission: EM | Admit: 2014-07-08 | Discharge: 2014-07-08 | Disposition: A | Discharge status: Home / Self Care | Payer: Medicaid Other | Attending: Emergency Medicine | Admitting: Emergency Medicine

## 2014-07-08 DIAGNOSIS — Z79899 Other long term (current) drug therapy: Secondary | ICD-10-CM | POA: Insufficient documentation

## 2014-07-08 DIAGNOSIS — Z872 Personal history of diseases of the skin and subcutaneous tissue: Secondary | ICD-10-CM | POA: Insufficient documentation

## 2014-07-08 DIAGNOSIS — Z862 Personal history of diseases of the blood and blood-forming organs and certain disorders involving the immune mechanism: Secondary | ICD-10-CM | POA: Insufficient documentation

## 2014-07-08 DIAGNOSIS — Z8619 Personal history of other infectious and parasitic diseases: Secondary | ICD-10-CM | POA: Insufficient documentation

## 2014-07-08 DIAGNOSIS — M79605 Pain in left leg: Secondary | ICD-10-CM

## 2014-07-08 DIAGNOSIS — Z87891 Personal history of nicotine dependence: Secondary | ICD-10-CM | POA: Insufficient documentation

## 2014-07-08 DIAGNOSIS — M79662 Pain in left lower leg: Secondary | ICD-10-CM | POA: Insufficient documentation

## 2014-07-08 DIAGNOSIS — Z87440 Personal history of urinary (tract) infections: Secondary | ICD-10-CM | POA: Insufficient documentation

## 2014-07-08 DIAGNOSIS — Z792 Long term (current) use of antibiotics: Secondary | ICD-10-CM | POA: Insufficient documentation

## 2014-07-08 DIAGNOSIS — Z8719 Personal history of other diseases of the digestive system: Secondary | ICD-10-CM | POA: Insufficient documentation

## 2014-07-08 MED ORDER — ENOXAPARIN SODIUM 100 MG/ML ~~LOC~~ SOLN
110.0000 mg | Freq: Once | SUBCUTANEOUS | Status: AC
  Administered 2014-07-08: 110 mg via SUBCUTANEOUS
  Filled 2014-07-08: qty 2

## 2014-07-08 MED ORDER — ACETAMINOPHEN 325 MG PO TABS
650.0000 mg | ORAL_TABLET | ORAL | Status: AC
  Administered 2014-07-08: 650 mg via ORAL
  Filled 2014-07-08: qty 2

## 2014-07-08 MED ORDER — ENOXAPARIN SODIUM 150 MG/ML ~~LOC~~ SOLN: 110.0000 mg | Freq: Two times a day (BID) | SUBCUTANEOUS | Status: DC

## 2014-07-08 NOTE — ED Provider Notes (Signed)
CSN: [id number]     Arrival date & time 07/08/14  0113 History   First MD Initiated Contact with Patient 07/08/14 0216     Chief Complaint  Patient presents with  . Leg Pain   (Consider location/radiation/quality/duration/timing/severity/associated sxs/prior Treatment) HPI  Bianca Cantrell is a 29 yo female presenting with left calf pain x 3 days.  She reports it also feels warmer than her right. She rates her pain as 7/10.  She states she doesn't remember any injury. 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 She denies any recent trauma, surgery or travel.  She denies history of DVT/PE, hemoptysis, estrogen containing medicine or leg swelling.  She denies chest pain, cough, or shortness breath.  Past Medical History  Diagnosis Date  . No pertinent past medical history   . Gallstones   . Chlamydia   . Anemia   . Sickle cell trait   . Infection     urinary tract infection  . Eczema   . Abnormal Pap smear     HPV   Past Surgical History  Procedure Laterality Date  . Cholecystectomy  2011  . Appendectomy  2010   Family History  Problem Relation Age of Onset  . Hypertension Mother   . Heart disease Mother   . Hypertension Maternal Grandmother   . Heart disease Maternal Grandmother   . Other Neg Hx   . Cancer Maternal Aunt     leukemia  . Cancer Maternal Uncle     pancreatic   History  Substance Use Topics  . Smoking status: Former Smoker -- 0.25 packs/day for 1 years    Types: Cigarettes    Quit date: 02/15/2013  . Smokeless tobacco: Never Used  . Alcohol Use: No   OB History    Gravida Para Term Preterm AB TAB SAB Ectopic Multiple Living   4 4 4  0 0 0 0 0 0 4     Review of Systems  Constitutional: Negative for fever.  Respiratory: Negative for cough and shortness of breath.   Cardiovascular: Negative for leg swelling.  Musculoskeletal: Positive for myalgias.      Allergies  Sulfa antibiotics; Sulfur; and Other  Home Medications   Prior to  Admission medications   Medication Sig Start Date End Date Taking? Authorizing Provider  acetaminophen (TYLENOL) 500 MG tablet Take 500 mg by mouth every 6 (six) hours as needed for mild pain.   Yes Historical Provider, MD  etonogestrel (NEXPLANON) 68 MG IMPL implant 1 each by Subdermal route once.   Yes Historical Provider, MD  ibuprofen (ADVIL,MOTRIN) 200 MG tablet Take 400 mg by mouth every 6 (six) hours as needed for moderate pain.   Yes Historical Provider, MD  Multiple Vitamin (MULTIVITAMIN WITH MINERALS) TABS tablet Take 1 tablet by mouth daily.   Yes Historical Provider, MD  butalbital-acetaminophen-caffeine (FIORICET) 50-325-40 MG per tablet Take 1-2 tablets by mouth every 6 (six) hours as needed for headache. Patient not taking: Reported on 07/08/2014 01/11/14 01/11/15  Joni ReiningNicole Pisciotta, PA-C  metroNIDAZOLE (FLAGYL) 500 MG tablet Take 1 tablet (500 mg total) by mouth 2 (two) times daily. Patient not taking: Reported on 03/04/2014 02/12/14   Brock Badharles A Harper, MD  polyethylene glycol [facility](MIRALAX / Ethelene HalGLYCOLAX) packet Take 17 g by mouth daily. Patient not taking: Reported on 06/17/2014 03/04/14   Arby BarretteMarcy Pfeiffer, MD   BP 128/81 mmHg  Pulse 94  Temp(Src) 97.6 F (36.4 C) (Oral)  Resp 20  SpO2 98% Physical Exam  Constitutional: She appears well-developed and well-nourished. No distress.  HENT:  Head: Normocephalic and atraumatic.  Eyes: Conjunctivae are normal.  Neck: Normal range of motion.  Cardiovascular: Normal rate, regular rhythm and intact distal pulses.   Pulmonary/Chest: Effort normal and breath sounds normal. No respiratory distress.  Abdominal: There is no tenderness.  Musculoskeletal: She exhibits no tenderness.  No significant TTP to left calf but reports generally painful, no redness, swelling, warmth or wound noted.  N/V intact  Neurological: She is alert.  Skin: Skin is warm and dry. She is not diaphoretic. No erythema.  Psychiatric: She has a normal mood and affect.   Nursing note and vitals reviewed.   ED Course  Procedures (including critical care time) Labs Review Labs Reviewed - No data to display  Imaging Review No results found.   EKG Interpretation None      MDM   Final diagnoses:  Pain of left lower extremity   29 yo with left calf pain and reported warmth x 3 days. There is no objective warmth or swelling however she does have a risk factor of sedentary job.  Discussed with pt would start on lovenox tonight with a prescription for the same unit DVT ruled out. Pt is well-appearing, in no acute distress and vital signs reviewed and not concerning. She appears safe to be discharged.  Discharge include follow-up in the morning with outpt venous doppler. Return precautions provided. Pt aware of plan and in agreement.   Filed Vitals:   07/08/14 0118  BP: 128/81  Pulse: 94  Temp: 97.6 F (36.4 C)  TempSrc: Oral  Resp: 20  SpO2: 98%   Meds given in ED:  Medications  enoxaparin (LOVENOX) injection 110 mg (110 mg Subcutaneous Given 07/08/14 0312)  acetaminophen (TYLENOL) tablet 650 mg (650 mg Oral Given 07/08/14 0306)    Discharge Medication List as of 07/08/2014  4:14 AM    START taking these medications   Details  enoxaparin (LOVENOX) 150 MG/ML injection Inject 0.73 mLs (110 mg total) into the skin every 12 (twelve) hours., Starting 07/08/2014, Until Discontinued, Print           Harle BattiestElizabeth Esai Stecklein, NP 07/09/14 [id number]  Loren Raceravid Yelverton, MD 07/16/14 2314

## 2014-07-08 NOTE — Discharge Instructions (Signed)
Please follow directions provided. Is very important for you to arrive scheduled for the Doppler study of your left leg to check for blood clots.  If there is not a blood clot in your leg please do not use the injections.  If there is a blood clot in your leg you will continue using the shots every 12 hours and follow-[facility].  Don't hesitate to return for any new, worsening or concerning symptoms.      SEEK IMMEDIATE MEDICAL CARE IF:  You have chest pain.  You have trouble breathing.  You have new or increased swelling or pain in one leg.  You cough up blood.  You notice blood in vomit, in a bowel movement, or in urine.

## 2014-07-08 NOTE — ED Notes (Signed)
Pt complains of left leg pain for three days, she states that it feels like it's cramping at night, no injury

## 2014-07-09 ENCOUNTER — Ambulatory Visit (HOSPITAL_COMMUNITY)
Admission: RE | Admit: 2014-07-09 | Discharge: 2014-07-09 | Disposition: A | Discharge status: Home / Self Care | Payer: Medicaid Other | Source: Ambulatory Visit | Attending: Emergency Medicine | Admitting: Emergency Medicine

## 2014-07-09 DIAGNOSIS — M79609 Pain in unspecified limb: Secondary | ICD-10-CM

## 2014-07-09 DIAGNOSIS — M79605 Pain in left leg: Secondary | ICD-10-CM | POA: Insufficient documentation

## 2014-07-09 NOTE — Progress Notes (Signed)
VASCULAR LAB PRELIMINARY  PRELIMINARY  PRELIMINARY  PRELIMINARY  Left lower extremity venous Doppler completed.    Preliminary report:  There is no DVT or SVT noted in the left lower extremity.   Aliza Moret, RVT 07/09/2014, 11:42 AM

## 2014-08-09 ENCOUNTER — Emergency Department (HOSPITAL_COMMUNITY)
Admission: EM | Admit: 2014-08-09 | Discharge: 2014-08-09 | Disposition: A | Discharge status: Home / Self Care | Payer: Medicaid Other | Attending: Emergency Medicine | Admitting: Emergency Medicine

## 2014-08-09 ENCOUNTER — Emergency Department (HOSPITAL_COMMUNITY): Payer: Medicaid Other

## 2014-08-09 ENCOUNTER — Encounter (HOSPITAL_COMMUNITY): Payer: Self-pay | Admitting: Emergency Medicine

## 2014-08-09 DIAGNOSIS — Z872 Personal history of diseases of the skin and subcutaneous tissue: Secondary | ICD-10-CM | POA: Insufficient documentation

## 2014-08-09 DIAGNOSIS — Z87440 Personal history of urinary (tract) infections: Secondary | ICD-10-CM | POA: Insufficient documentation

## 2014-08-09 DIAGNOSIS — H9209 Otalgia, unspecified ear: Secondary | ICD-10-CM | POA: Insufficient documentation

## 2014-08-09 DIAGNOSIS — R079 Chest pain, unspecified: Secondary | ICD-10-CM | POA: Insufficient documentation

## 2014-08-09 DIAGNOSIS — B349 Viral infection, unspecified: Secondary | ICD-10-CM | POA: Insufficient documentation

## 2014-08-09 DIAGNOSIS — Z862 Personal history of diseases of the blood and blood-forming organs and certain disorders involving the immune mechanism: Secondary | ICD-10-CM | POA: Insufficient documentation

## 2014-08-09 DIAGNOSIS — Z87891 Personal history of nicotine dependence: Secondary | ICD-10-CM | POA: Insufficient documentation

## 2014-08-09 DIAGNOSIS — R0602 Shortness of breath: Secondary | ICD-10-CM

## 2014-08-09 DIAGNOSIS — K088 Other specified disorders of teeth and supporting structures: Secondary | ICD-10-CM | POA: Insufficient documentation

## 2014-08-09 LAB — RAPID STREP SCREEN (MED CTR MEBANE ONLY): Streptococcus, Group A Screen (Direct): NEGATIVE

## 2014-08-09 MED ORDER — CEPASTAT 14.5 MG MT LOZG: 1.0000 | LOZENGE | OROMUCOSAL | Status: DC | PRN

## 2014-08-09 MED ORDER — LORATADINE 10 MG PO TABS: 10.0000 mg | ORAL_TABLET | Freq: Every day | ORAL | Status: DC

## 2014-08-09 NOTE — ED Notes (Signed)
Pt c/o sore throat, bilateral ear pain, and congestion since Friday.  Pt sts "my face and ears feel full." A&Ox4 and ambulatory. Denies chest pain but sts, "It kinda hurts when I take deep breaths."

## 2014-08-09 NOTE — Discharge Instructions (Signed)
Take claritin as needed for symptoms. Use lozenges as needed for sore throat. Refer to attached documents for more information.

## 2014-08-09 NOTE — ED Provider Notes (Signed)
CSN: [id number]     Arrival date & time 08/09/14  2050 History   First MD Initiated Contact with Patient 08/09/14 2125    This chart was scribed for non-physician practitioner, Emilia BeckKaitlyn Evelia Waskey, PA-C, working with Geoffery Lyonsouglas Delo, MD by Marica OtterNusrat Rahman, ED Scribe. This patient was seen in room WTR8/WTR8 and the patient's care was started at 11:04 PM.  Chief Complaint  Patient presents with  . Sore Throat  . Otalgia  . Nasal Congestion   The history is provided by the patient. No language interpreter was used.   PCP: Jeanann LewandowskyJEGEDE, OLUGBEMIGA, MD HPI Comments: Bianca Cantrell is a 29 y.o. female, with PMH noted below, who presents to the Emergency Department complaining of dental pain onset 2 days ago and associated sore throat, ear pain, jaw pain, runny nose, sensation of chest "feeling heavy," onset today.  Pt denies SOB. Pt reports taking any measures at home to alleviate her Sx.    Past Medical History  Diagnosis Date  . No pertinent past medical history   . Gallstones   . Chlamydia   . Anemia   . Sickle cell trait   . Infection     urinary tract infection  . Eczema   . Abnormal Pap smear     HPV   Past Surgical History  Procedure Laterality Date  . Cholecystectomy  2011  . Appendectomy  2010   Family History  Problem Relation Age of Onset  . Hypertension Mother   . Heart disease Mother   . Hypertension Maternal Grandmother   . Heart disease Maternal Grandmother   . Other Neg Hx   . Cancer Maternal Aunt     leukemia  . Cancer Maternal Uncle     pancreatic   History  Substance Use Topics  . Smoking status: Former Smoker -- 0.25 packs/day for 1 years    Types: Cigarettes    Quit date: 02/15/2013  . Smokeless tobacco: Never Used  . Alcohol Use: No   OB History    Gravida Para Term Preterm AB TAB SAB Ectopic Multiple Living   4 4 4  0 0 0 0 0 0 4     Review of Systems  Constitutional: Negative for fever and chills.  HENT: Positive for congestion, dental problem, ear  pain, rhinorrhea and sore throat.        Jaw pain  Respiratory: Negative for shortness of breath.   Cardiovascular: Positive for chest pain ("feels heavy").  All other systems reviewed and are negative.  Allergies  Sulfa antibiotics; Sulfur; and Other  Home Medications   Prior to Admission medications   Medication Sig Start Date End Date Taking? Authorizing Provider  acetaminophen (TYLENOL) 500 MG tablet Take 500 mg by mouth every 6 (six) hours as needed for mild pain.   Yes Historical Provider, MD  etonogestrel (NEXPLANON) 68 MG IMPL implant 1 each by Subdermal route once.   Yes Historical Provider, MD  Multiple Vitamin (MULTIVITAMIN WITH MINERALS) TABS tablet Take 1 tablet by mouth daily.   Yes Historical Provider, MD  butalbital-acetaminophen-caffeine (FIORICET) 50-325-40 MG per tablet Take 1-2 tablets by mouth every 6 (six) hours as needed for headache. Patient not taking: Reported on 07/08/2014 01/11/14 01/11/15  Joni ReiningNicole Pisciotta, PA-C  enoxaparin (LOVENOX) 150 MG/ML injection Inject 0.73 mLs (110 mg total) into the skin every 12 (twelve) hours. Patient not taking: Reported on 08/09/2014 07/08/14   Harle BattiestElizabeth Tysinger, NP  metroNIDAZOLE (FLAGYL) 500 MG tablet Take 1 tablet (500 mg total) by mouth  2 (two) times daily. Patient not taking: Reported on 03/04/2014 02/12/14   Brock Bad, MD  polyethylene glycol [facility] / Ethelene Hal) packet Take 17 g by mouth daily. Patient not taking: Reported on 06/17/2014 03/04/14   Arby Barrette, MD   Triage Vitals: BP 128/75 mmHg  Pulse 106  Temp(Src) 98.6 F (37 C) (Oral)  Resp 18  SpO2 99% Physical Exam  Constitutional: She is oriented to person, place, and time. She appears well-developed and well-nourished. No distress.  HENT:  Head: Normocephalic and atraumatic.  Mouth/Throat: Uvula is midline and mucous membranes are normal. Posterior oropharyngeal erythema present.  Eyes: Conjunctivae and EOM are normal.  Neck: Neck supple. No tracheal  deviation present.  Cardiovascular: Normal rate.   Pulmonary/Chest: Effort normal. No respiratory distress.  Musculoskeletal: Normal range of motion.  Lymphadenopathy:    She has cervical adenopathy.  Neurological: She is alert and oriented to person, place, and time.  Skin: Skin is warm and dry.  Psychiatric: She has a normal mood and affect. Her behavior is normal.  Nursing note and vitals reviewed.  ED Course  Procedures (including critical care time) DIAGNOSTIC STUDIES: Oxygen Saturation is 99% on RA, nl by my interpretation.    COORDINATION OF CARE: 11:07 PM-Discussed treatment plan with pt at bedside and pt agreed to plan.   Labs Review Labs Reviewed  RAPID STREP SCREEN (NOT AT [facility])  CULTURE, GROUP A STREP    Imaging Review Dg Chest 2 View  08/09/2014   CLINICAL DATA:  Shortness of breath. Central chest pain. Symptoms for 2 days.  EXAM: CHEST  2 VIEW  COMPARISON:  06/27/2014  FINDINGS: The cardiomediastinal contours are normal. Mild central bronchial thickening appears chronic. Pulmonary vasculature is normal. No consolidation, pleural effusion, or pneumothorax. No acute osseous abnormalities are seen.  IMPRESSION: No acute pulmonary process.  Mild chronic bronchial thickening.   Electronically Signed   By: Rubye Oaks M.D.   On: 08/09/2014 22:50     EKG Interpretation None      MDM   Final diagnoses:  SOB (shortness of breath)    Patient likely has a viral illness and will be discharged with throat lozenges and claritin for symptoms.   I personally performed the services described in this documentation, which was scribed in my presence. The recorded information has been reviewed and is accurate.    Emilia Beck, PA-C 08/10/14 1610  Geoffery Lyons, MD 08/12/14 [phone]

## 2014-08-13 ENCOUNTER — Encounter (HOSPITAL_COMMUNITY): Payer: Self-pay | Admitting: Family Medicine

## 2014-08-13 LAB — CULTURE, GROUP A STREP

## 2014-08-26 ENCOUNTER — Emergency Department (HOSPITAL_COMMUNITY)
Admission: EM | Admit: 2014-08-26 | Discharge: 2014-08-26 | Disposition: A | Discharge status: Home / Self Care | Payer: Medicaid Other | Attending: Emergency Medicine | Admitting: Emergency Medicine

## 2014-08-26 ENCOUNTER — Encounter (HOSPITAL_COMMUNITY): Payer: Self-pay | Admitting: Emergency Medicine

## 2014-08-26 DIAGNOSIS — Z872 Personal history of diseases of the skin and subcutaneous tissue: Secondary | ICD-10-CM | POA: Insufficient documentation

## 2014-08-26 DIAGNOSIS — Z87891 Personal history of nicotine dependence: Secondary | ICD-10-CM | POA: Insufficient documentation

## 2014-08-26 DIAGNOSIS — R0789 Other chest pain: Secondary | ICD-10-CM | POA: Insufficient documentation

## 2014-08-26 DIAGNOSIS — Z3202 Encounter for pregnancy test, result negative: Secondary | ICD-10-CM | POA: Insufficient documentation

## 2014-08-26 DIAGNOSIS — E669 Obesity, unspecified: Secondary | ICD-10-CM | POA: Insufficient documentation

## 2014-08-26 DIAGNOSIS — Z862 Personal history of diseases of the blood and blood-forming organs and certain disorders involving the immune mechanism: Secondary | ICD-10-CM | POA: Insufficient documentation

## 2014-08-26 DIAGNOSIS — Z8719 Personal history of other diseases of the digestive system: Secondary | ICD-10-CM | POA: Insufficient documentation

## 2014-08-26 DIAGNOSIS — Z8619 Personal history of other infectious and parasitic diseases: Secondary | ICD-10-CM | POA: Insufficient documentation

## 2014-08-26 DIAGNOSIS — R109 Unspecified abdominal pain: Secondary | ICD-10-CM | POA: Insufficient documentation

## 2014-08-26 DIAGNOSIS — Z87440 Personal history of urinary (tract) infections: Secondary | ICD-10-CM | POA: Insufficient documentation

## 2014-08-26 HISTORY — DX: Obesity, unspecified: E66.9

## 2014-08-26 LAB — CBC WITH DIFFERENTIAL/PLATELET
BASOS PCT: 0 % (ref 0–1)
Basophils Absolute: 0 10*3/uL (ref 0.0–0.1)
EOS ABS: 0.1 10*3/uL (ref 0.0–0.7)
EOS PCT: 1 % (ref 0–5)
HEMATOCRIT: 38.6 % (ref 36.0–46.0)
Hemoglobin: 13.3 g/dL (ref 12.0–15.0)
Lymphocytes Relative: 35 % (ref 12–46)
Lymphs Abs: 3.9 10*3/uL (ref 0.7–4.0)
MCH: 29.1 pg (ref 26.0–34.0)
MCHC: 34.5 g/dL (ref 30.0–36.0)
MCV: 84.5 fL (ref 78.0–100.0)
MONOS PCT: 4 % (ref 3–12)
Monocytes Absolute: 0.5 10*3/uL (ref 0.1–1.0)
NEUTROS ABS: 6.7 10*3/uL (ref 1.7–7.7)
Neutrophils Relative %: 60 % (ref 43–77)
Platelets: 251 10*3/uL (ref 150–400)
RBC: 4.57 MIL/uL (ref 3.87–5.11)
RDW: 13.3 % (ref 11.5–15.5)
WBC: 11.2 10*3/uL — ABNORMAL HIGH (ref 4.0–10.5)

## 2014-08-26 LAB — URINALYSIS, ROUTINE W REFLEX MICROSCOPIC
Bilirubin Urine: NEGATIVE
GLUCOSE, UA: NEGATIVE mg/dL
Hgb urine dipstick: NEGATIVE
Ketones, ur: NEGATIVE mg/dL
Leukocytes, UA: NEGATIVE
Nitrite: NEGATIVE
PH: 6 (ref 5.0–8.0)
PROTEIN: NEGATIVE mg/dL
Specific Gravity, Urine: 1.012 (ref 1.005–1.030)
Urobilinogen, UA: 0.2 mg/dL (ref 0.0–1.0)

## 2014-08-26 LAB — COMPREHENSIVE METABOLIC PANEL
ALT: 27 U/L (ref 14–54)
ANION GAP: 8 (ref 5–15)
AST: 19 U/L (ref 15–41)
Albumin: 3.4 g/dL — ABNORMAL LOW (ref 3.5–5.0)
Alkaline Phosphatase: 68 U/L (ref 38–126)
BUN: 13 mg/dL (ref 6–20)
CO2: 23 mmol/L (ref 22–32)
Calcium: 8.9 mg/dL (ref 8.9–10.3)
Chloride: 106 mmol/L (ref 101–111)
Creatinine, Ser: 0.69 mg/dL (ref 0.44–1.00)
GFR calc Af Amer: >60 mL/min (ref >60)
GFR calc non Af Amer: >60 mL/min (ref >60)
Glucose, Bld: 100 mg/dL — ABNORMAL HIGH (ref 65–99)
POTASSIUM: 3.5 mmol/L (ref 3.5–5.1)
SODIUM: 137 mmol/L (ref 135–145)
TOTAL PROTEIN: 7.4 g/dL (ref 6.5–8.1)
Total Bilirubin: 0.3 mg/dL (ref 0.3–1.2)

## 2014-08-26 LAB — LIPASE, BLOOD: LIPASE: 33 U/L (ref 22–51)

## 2014-08-26 LAB — POC URINE PREG, ED: Preg Test, Ur: NEGATIVE

## 2014-08-26 MED ORDER — GI COCKTAIL ~~LOC~~
30.0000 mL | Freq: Once | ORAL | Status: AC
  Administered 2014-08-26: 30 mL via ORAL
  Filled 2014-08-26: qty 30

## 2014-08-26 MED ORDER — OMEPRAZOLE 20 MG PO CPDR: 20.0000 mg | DELAYED_RELEASE_CAPSULE | Freq: Every day | ORAL | Status: DC

## 2014-08-26 NOTE — ED Notes (Signed)
Pt c/o swelling over lower sternum, Dr. Jeraldine Loots made aware at pt's request.

## 2014-08-26 NOTE — ED Provider Notes (Signed)
CSN: [id number]     Arrival date & time 08/26/14  2022 History   First MD Initiated Contact with Patient 08/26/14 2036     Chief Complaint  Patient presents with  . Abdominal Pain  . Diarrhea     (Consider location/radiation/quality/duration/timing/severity/associated sxs/prior Treatment) HPI Patient presents with concern of sternal pain. Pain began today, approximately 8 hours ago, after the patient ate lunch. Patient scrubs the pain is severe, sharp, nonradiating, burning. There was no associated intrascapular pain, no dyspnea, lightheadedness, syncope. Patient states that the pain is more severe than typical reflux per Patient did not take any medication for the pain following the onset, though it has improved substantially by the time of evaluation. Patient bulges a history of reflux, but states that she takes Prilosec only as needed, occasionally. No other typical medications for her GERD. No other recent medical changes, medication changes, lifestyle changes.  Past Medical History  Diagnosis Date  . No pertinent past medical history   . Gallstones   . Chlamydia   . Anemia   . Sickle cell trait   . Infection     urinary tract infection  . Eczema   . Abnormal Pap smear     HPV  . Obesity    Past Surgical History  Procedure Laterality Date  . Cholecystectomy  2011  . Appendectomy  2010   Family History  Problem Relation Age of Onset  . Hypertension Mother   . Heart disease Mother   . Hypertension Maternal Grandmother   . Heart disease Maternal Grandmother   . Other Neg Hx   . Cancer Maternal Aunt     leukemia  . Cancer Maternal Uncle     pancreatic   History  Substance Use Topics  . Smoking status: Former Smoker -- 0.25 packs/day for 1 years    Types: Cigarettes    Quit date: 02/15/2013  . Smokeless tobacco: Never Used  . Alcohol Use: No   OB History    Gravida Para Term Preterm AB TAB SAB Ectopic Multiple Living   4 4 4  0 0 0 0 0 0 4     Review of  Systems  Constitutional:       Per HPI, otherwise negative  HENT:       Per HPI, otherwise negative  Respiratory:       Per HPI, otherwise negative  Cardiovascular:       Per HPI, otherwise negative  Gastrointestinal: Negative for vomiting.  Endocrine:       Negative aside from HPI  Genitourinary:       Neg aside from HPI   Musculoskeletal:       Per HPI, otherwise negative  Skin: Negative.   Neurological: Negative for syncope.      Allergies  Sulfa antibiotics; Sulfur; and Other  Home Medications   Prior to Admission medications   Medication Sig Start Date End Date Taking? Authorizing Provider  acetaminophen (TYLENOL) 500 MG tablet Take 500 mg by mouth every 6 (six) hours as needed for mild pain.    Historical Provider, MD  butalbital-acetaminophen-caffeine (FIORICET) 50-325-40 MG per tablet Take 1-2 tablets by mouth every 6 (six) hours as needed for headache. Patient not taking: Reported on 07/08/2014 01/11/14 01/11/15  Joni Reining Pisciotta, PA-C  enoxaparin (LOVENOX) 150 MG/ML injection Inject 0.73 mLs (110 mg total) into the skin every 12 (twelve) hours. Patient not taking: Reported on 08/09/2014 07/08/14   Harle Battiest, NP  etonogestrel (NEXPLANON) 68 MG IMPL implant 1  each by Subdermal route once.    Historical Provider, MD  loratadine (CLARITIN) 10 MG tablet Take 1 tablet (10 mg total) by mouth daily. 08/09/14   Kaitlyn Szekalski, PA-C  metroNIDAZOLE (FLAGYL) 500 MG tablet Take 1 tablet (500 mg total) by mouth 2 (two) times daily. Patient not taking: Reported on 03/04/2014 02/12/14   Brock Bad, MD  Multiple Vitamin (MULTIVITAMIN WITH MINERALS) TABS tablet Take 1 tablet by mouth daily.    Historical Provider, MD  phenol-menthol (CEPASTAT) 14.5 MG lozenge Place 1 lozenge inside cheek as needed for sore throat. 08/09/14   Kaitlyn Szekalski, PA-C  polyethylene glycol (MIRALAX / GLYCOLAX) packet Take 17 g by mouth daily. Patient not taking: Reported on 06/17/2014 03/04/14    Arby Barrette, MD   BP 130/80 mmHg  Pulse 95  Temp(Src) 98.6 F (37 C) (Oral)  Resp 14  Ht  (1.473 m)  Wt 260 lb (117.935 kg)  BMI 54.35 kg/m2  SpO2 98% Physical Exam  Constitutional: She is oriented to person, place, and time. She appears well-developed and well-nourished. No distress.  HENT:  Head: Normocephalic and atraumatic.  Eyes: Conjunctivae and EOM are normal.  Cardiovascular: Normal rate and regular rhythm.   Pulmonary/Chest: Effort normal and breath sounds normal. No stridor. No respiratory distress.  No tenderness to palpation about the chest wall  Abdominal: She exhibits no distension.  Musculoskeletal: She exhibits no edema.  Neurological: She is alert and oriented to person, place, and time. No cranial nerve deficit.  Skin: Skin is warm and dry.  Psychiatric: She has a normal mood and affect.  Nursing note and vitals reviewed.   ED Course  Procedures (including critical care time) Labs Review Labs Reviewed  CBC WITH DIFFERENTIAL/PLATELET - Abnormal; Notable for the following:    WBC 11.2 (*)    All other components within normal limits  COMPREHENSIVE METABOLIC PANEL - Abnormal; Notable for the following:    Glucose, Bld 100 (*)    Albumin 3.4 (*)    All other components within normal limits  LIPASE, BLOOD  URINALYSIS, ROUTINE W REFLEX MICROSCOPIC (NOT AT [facility])  POC URINE PREG, ED    Imaging Review No results found.   EKG Interpretation   Date/Time:  Wednesday August 26 2014 21:27:59 EDT Ventricular Rate:  97 PR Interval:  160 QRS Duration: 88 QT Interval:  350 QTC Calculation: 445 R Axis:   42 Text Interpretation:  Sinus rhythm Consider right atrial enlargement Sinus  rhythm Artifact T wave abnormality Borderline ECG Confirmed by Gerhard Munch  MD [phone]) on 08/26/2014 9:45:13 PM     Chart review demonstrates multiple visits for multiple reasons recently. On repeat exam the patient appears well, has no new complaint. We discussed  all findings, discussed the appropriate use of Prilosec, daily for control of reflux symptoms.  MDM   Patient presents with sternal discomfort, is found to have reassuring labs, vital signs. Patient endorsed history of reflux suggests etiology. Here the patient had no new complaints, no evidence for eminent decompensation, no evidence for bacteremia or sepsis. No evidence for ongoing coronary ischemia. Patient discharged to follow-up with primary care tomorrow.     Gerhard Munch, MD 08/26/14 [phone]

## 2014-08-26 NOTE — ED Notes (Signed)
Pt. reports upper abdominal pain onset today after eating lunch and  Diarrhea onset last night  , denies emesis , no fever or chills.

## 2014-08-26 NOTE — Discharge Instructions (Signed)
As discussed, your evaluation today has been largely reassuring.  But, it is important that you monitor your condition carefully, and do not hesitate to return to the ED if you develop new, or concerning changes in your condition. ? ?Otherwise, please follow-up with your physician for appropriate ongoing care. ? ?

## 2014-08-29 ENCOUNTER — Encounter (HOSPITAL_COMMUNITY): Payer: Self-pay | Admitting: *Deleted

## 2014-08-29 ENCOUNTER — Emergency Department (HOSPITAL_COMMUNITY)
Admission: EM | Admit: 2014-08-29 | Discharge: 2014-08-30 | Disposition: A | Discharge status: Home / Self Care | Payer: Medicaid Other | Attending: Emergency Medicine | Admitting: Emergency Medicine

## 2014-08-29 DIAGNOSIS — E669 Obesity, unspecified: Secondary | ICD-10-CM | POA: Insufficient documentation

## 2014-08-29 DIAGNOSIS — Z8619 Personal history of other infectious and parasitic diseases: Secondary | ICD-10-CM | POA: Insufficient documentation

## 2014-08-29 DIAGNOSIS — Z8719 Personal history of other diseases of the digestive system: Secondary | ICD-10-CM | POA: Insufficient documentation

## 2014-08-29 DIAGNOSIS — Z87440 Personal history of urinary (tract) infections: Secondary | ICD-10-CM | POA: Insufficient documentation

## 2014-08-29 DIAGNOSIS — M7989 Other specified soft tissue disorders: Secondary | ICD-10-CM

## 2014-08-29 DIAGNOSIS — Z872 Personal history of diseases of the skin and subcutaneous tissue: Secondary | ICD-10-CM | POA: Insufficient documentation

## 2014-08-29 DIAGNOSIS — Z79899 Other long term (current) drug therapy: Secondary | ICD-10-CM | POA: Insufficient documentation

## 2014-08-29 DIAGNOSIS — Z87891 Personal history of nicotine dependence: Secondary | ICD-10-CM | POA: Insufficient documentation

## 2014-08-29 DIAGNOSIS — Z862 Personal history of diseases of the blood and blood-forming organs and certain disorders involving the immune mechanism: Secondary | ICD-10-CM | POA: Insufficient documentation

## 2014-08-29 NOTE — ED Notes (Signed)
Pt in c/o hand discoloration and tingling to her fingers, normal color noted, normal CMS, no distress noted, pt thinks it is from her smoking

## 2014-08-30 NOTE — ED Provider Notes (Signed)
CSN: [id number]     Arrival date & time 08/29/14  2343 History   First MD Initiated Contact with Patient 08/30/14 0119     Chief Complaint  Patient presents with  . Hand Pain     (Consider location/radiation/quality/duration/timing/severity/associated sxs/prior Treatment) Patient is a 29 y.o. female presenting with hand pain. The history is provided by the patient. No language interpreter was used.  Hand Pain This is a new problem. The current episode started today. The problem occurs constantly. The problem has been unchanged. Nothing aggravates the symptoms. She has tried nothing for the symptoms. The treatment provided no relief.  Pt reports veins look blue and hands are puffy  Past Medical History  Diagnosis Date  . No pertinent past medical history   . Gallstones   . Chlamydia   . Anemia   . Sickle cell trait   . Infection     urinary tract infection  . Eczema   . Abnormal Pap smear     HPV  . Obesity    Past Surgical History  Procedure Laterality Date  . Cholecystectomy  2011  . Appendectomy  2010   Family History  Problem Relation Age of Onset  . Hypertension Mother   . Heart disease Mother   . Hypertension Maternal Grandmother   . Heart disease Maternal Grandmother   . Other Neg Hx   . Cancer Maternal Aunt     leukemia  . Cancer Maternal Uncle     pancreatic   History  Substance Use Topics  . Smoking status: Former Smoker -- 0.25 packs/day for 1 years    Types: Cigarettes    Quit date: 02/15/2013  . Smokeless tobacco: Never Used  . Alcohol Use: No   OB History    Gravida Para Term Preterm AB TAB SAB Ectopic Multiple Living   0 0 0 0 0 0 4     Review of Systems  All other systems reviewed and are negative.     Allergies  Sulfa antibiotics; Sulfur; and Other  Home Medications   Prior to Admission medications   Medication Sig Start Date End Date Taking? Authorizing Provider  acetaminophen (TYLENOL) 500 MG tablet Take 500 mg by mouth  every 6 (six) hours as needed for mild pain.    Historical Provider, MD  butalbital-acetaminophen-caffeine (FIORICET) 50-325-40 MG per tablet Take 1-2 tablets by mouth every 6 (six) hours as needed for headache. Patient not taking: Reported on 07/08/2014 01/11/14 01/11/15  Joni Reining Pisciotta, PA-C  enoxaparin (LOVENOX) 150 MG/ML injection Inject 0.73 mLs (110 mg total) into the skin every 12 (twelve) hours. Patient not taking: Reported on 08/09/2014 07/08/14   Harle Battiest, NP  etonogestrel (NEXPLANON) 68 MG IMPL implant 1 each by Subdermal route once.    Historical Provider, MD  loratadine (CLARITIN) 10 MG tablet Take 1 tablet (10 mg total) by mouth daily. 08/09/14   Kaitlyn Szekalski, PA-C  metroNIDAZOLE (FLAGYL) 500 MG tablet Take 1 tablet (500 mg total) by mouth 2 (two) times daily. Patient not taking: Reported on 03/04/2014 02/12/14   Brock Bad, MD  Multiple Vitamin (MULTIVITAMIN WITH MINERALS) TABS tablet Take 1 tablet by mouth daily.    Historical Provider, MD  omeprazole (PRILOSEC) 20 MG capsule Take 1 capsule (20 mg total) by mouth daily. 08/26/14   Gerhard Munch, MD  phenol-menthol (CEPASTAT) 14.5 MG lozenge Place 1 lozenge inside cheek as needed for sore throat. 08/09/14   Emilia Beck, PA-C  polyethylene glycol (MIRALAX /  GLYCOLAX) packet Take 17 g by mouth daily. Patient not taking: Reported on 06/17/2014 03/04/14   Arby Barrette, MD   BP 126/84 mmHg  Pulse 92  Temp(Src) 98.4 F (36.9 C) (Oral)  Resp 15  SpO2 100% Physical Exam  Constitutional: She is oriented to person, place, and time. She appears well-developed and well-nourished.  HENT:  Head: Normocephalic.  Eyes: EOM are normal.  Neck: Normal range of motion.  Pulmonary/Chest: Effort normal.  Abdominal: She exhibits no distension.  Musculoskeletal: Normal range of motion.  Neurological: She is alert and oriented to person, place, and time.  Psychiatric: She has a normal mood and affect.  Nursing note and vitals  reviewed.   ED Course  Procedures (including critical care time) Labs Review Labs Reviewed - No data to display  Imaging Review No results found.   EKG Interpretation None      MDM  Pt advised veins are normal.  Pt advised decrease salt if hands feel swollen   Final diagnoses:  Bilateral hand swelling        Elson Areas, PA-C 08/30/14 [id number]  Derwood Kaplan, MD 09/01/14 [phone]

## 2014-08-30 NOTE — Discharge Instructions (Signed)
Return if any problems.  Avoid salt

## 2014-08-30 NOTE — ED Notes (Signed)
Pt reports numbness in her fingers on both hands, with pain in her left forearm. Pt believes that this is a side effect from her starting to smoke again. Pt also states that she can see the veins in her hands more clearly, and that they're "puffy".

## 2014-10-07 ENCOUNTER — Emergency Department
Admission: EM | Admit: 2014-10-07 | Discharge: 2014-10-07 | Disposition: A | Discharge status: Home / Self Care | Payer: Medicaid Other | Attending: Emergency Medicine | Admitting: Emergency Medicine

## 2014-10-07 DIAGNOSIS — Z79899 Other long term (current) drug therapy: Secondary | ICD-10-CM | POA: Insufficient documentation

## 2014-10-07 DIAGNOSIS — Y9389 Activity, other specified: Secondary | ICD-10-CM | POA: Insufficient documentation

## 2014-10-07 DIAGNOSIS — Z87891 Personal history of nicotine dependence: Secondary | ICD-10-CM | POA: Insufficient documentation

## 2014-10-07 DIAGNOSIS — Z793 Long term (current) use of hormonal contraceptives: Secondary | ICD-10-CM | POA: Insufficient documentation

## 2014-10-07 DIAGNOSIS — A059 Bacterial foodborne intoxication, unspecified: Secondary | ICD-10-CM

## 2014-10-07 DIAGNOSIS — Y9289 Other specified places as the place of occurrence of the external cause: Secondary | ICD-10-CM | POA: Insufficient documentation

## 2014-10-07 DIAGNOSIS — T6294XA Toxic effect of unspecified noxious substance eaten as food, undetermined, initial encounter: Secondary | ICD-10-CM | POA: Insufficient documentation

## 2014-10-07 DIAGNOSIS — Y998 Other external cause status: Secondary | ICD-10-CM | POA: Insufficient documentation

## 2014-10-07 LAB — CBC WITH DIFFERENTIAL/PLATELET
Basophils Absolute: 0 10*3/uL (ref 0–0.1)
Basophils Relative: 0 %
Eosinophils Absolute: 0 10*3/uL (ref 0–0.7)
Eosinophils Relative: 0 %
HEMATOCRIT: 41.1 % (ref 35.0–47.0)
HEMOGLOBIN: 13.6 g/dL (ref 12.0–16.0)
LYMPHS ABS: 1.7 10*3/uL (ref 1.0–3.6)
Lymphocytes Relative: 16 %
MCH: 29 pg (ref 26.0–34.0)
MCHC: 33.1 g/dL (ref 32.0–36.0)
MCV: 87.5 fL (ref 80.0–100.0)
Monocytes Absolute: 0.8 10*3/uL (ref 0.2–0.9)
Monocytes Relative: 7 %
NEUTROS ABS: 8.5 10*3/uL — AB (ref 1.4–6.5)
Neutrophils Relative %: 77 %
Platelets: 255 10*3/uL (ref 150–440)
RBC: 4.7 MIL/uL (ref 3.80–5.20)
RDW: 13.9 % (ref 11.5–14.5)
WBC: 11 10*3/uL (ref 3.6–11.0)

## 2014-10-07 LAB — COMPREHENSIVE METABOLIC PANEL
ALT: 37 U/L (ref 14–54)
AST: 24 U/L (ref 15–41)
Albumin: 3.8 g/dL (ref 3.5–5.0)
Alkaline Phosphatase: 59 U/L (ref 38–126)
Anion gap: 8 (ref 5–15)
BUN: 15 mg/dL (ref 6–20)
CO2: 25 mmol/L (ref 22–32)
Calcium: 9.4 mg/dL (ref 8.9–10.3)
Chloride: 105 mmol/L (ref 101–111)
Creatinine, Ser: 0.53 mg/dL (ref 0.44–1.00)
GFR calc Af Amer: >60 mL/min (ref >60)
GFR calc non Af Amer: >60 mL/min (ref >60)
Glucose, Bld: 100 mg/dL — ABNORMAL HIGH (ref 65–99)
POTASSIUM: 3.9 mmol/L (ref 3.5–5.1)
Sodium: 138 mmol/L (ref 135–145)
Total Bilirubin: 0.3 mg/dL (ref 0.3–1.2)
Total Protein: 7.8 g/dL (ref 6.5–8.1)

## 2014-10-07 LAB — URINALYSIS COMPLETE WITH MICROSCOPIC (ARMC ONLY)
Bacteria, UA: NONE SEEN
Bilirubin Urine: NEGATIVE
GLUCOSE, UA: NEGATIVE mg/dL
Hgb urine dipstick: NEGATIVE
KETONES UR: NEGATIVE mg/dL
Leukocytes, UA: NEGATIVE
Nitrite: NEGATIVE
Protein, ur: NEGATIVE mg/dL
SPECIFIC GRAVITY, URINE: 1.014 (ref 1.005–1.030)
pH: 6 (ref 5.0–8.0)

## 2014-10-07 LAB — LIPASE, BLOOD: LIPASE: 39 U/L (ref 22–51)

## 2014-10-07 MED ORDER — ONDANSETRON HCL 4 MG/2ML IJ SOLN
4.0000 mg | Freq: Once | INTRAMUSCULAR | Status: AC
  Administered 2014-10-07: 4 mg via INTRAVENOUS
  Filled 2014-10-07: qty 2

## 2014-10-07 MED ORDER — MORPHINE SULFATE 2 MG/ML IJ SOLN
2.0000 mg | Freq: Once | INTRAMUSCULAR | Status: AC
  Administered 2014-10-07: 2 mg via INTRAVENOUS

## 2014-10-07 MED ORDER — MORPHINE SULFATE 2 MG/ML IJ SOLN
2.0000 mg | Freq: Once | INTRAMUSCULAR | Status: AC
  Administered 2014-10-07: 2 mg via INTRAVENOUS
  Filled 2014-10-07: qty 1

## 2014-10-07 MED ORDER — MORPHINE SULFATE 2 MG/ML IJ SOLN
INTRAMUSCULAR | Status: AC
  Administered 2014-10-07: 2 mg via INTRAVENOUS
  Filled 2014-10-07: qty 1

## 2014-10-07 MED ORDER — SODIUM CHLORIDE 0.9 % IV SOLN
Freq: Once | INTRAVENOUS | Status: AC
  Administered 2014-10-07: 09:00:00 via INTRAVENOUS

## 2014-10-07 MED ORDER — ONDANSETRON HCL 4 MG PO TABS: ORAL_TABLET | ORAL | Status: DC

## 2014-10-07 NOTE — Discharge Instructions (Signed)
Food Poisoning Food poisoning is an illness caused by something you ate or drank. It usually lasts 1 to 2 days. Problems may be worse for people with low immune systems, the elderly, children and infants, and pregnant women. HOME CARE  Drink enough water and fluids to keep your pee (urine) clear or pale yellow. Drink small amounts often.  Ask your doctor how to replace body fluid losses (rehydration).  Avoid:  Foods high in sugar.  Alcohol.  Bubbly (carbonated) drinks.  Tobacco.  Juice.  Caffeine drinks.  Very hot or cold fluids.  Fatty, greasy foods.  Eating too much at one time.  Dairy products until 24 to 48 hours after watery poop (diarrhea) stops.  You may eat foods with active cultures (probiotics). They can be found in some yogurts and supplements.  Wash your hands well to avoid spreading the illness.  Only take medicines as told by your doctor. Do not give aspirin to children.  Ask your doctor if you should keep taking your regular medicines. GET HELP RIGHT AWAY IF:   You have trouble breathing, swallowing, talking, or moving.  You have blurry vision.  You cannot keep fluids down.  You pass out (faint) or almost pass out.  Your eyes turn yellow.  You keep throwing up (vomiting) or having watery poop.  Belly (abdominal) pain starts, gets worse, or is just in one small spot (localizes).  You have a fever.  Your watery poop has blood in it.  You feel very weak, dizzy, or thirsty.  You do not pee for 8 hours. MAKE SURE YOU:   Understand these instructions.  Will watch your condition.  Will get help right away if you are not doing well or get worse. Document Released: 08/10/2009 Document Revised: 05/15/2011 Document Reviewed: 07/08/2010 [facility] Patient Information 2015 [place], [place]. This information is not intended to replace advice given to you by your health care provider. Make sure you discuss any questions you have with your health care  provider.  Use the zofran melt on your tongue for nausea up to 3 x a day. Take peptobismal for the diarrhea. Remember it will turn your stool black. Return for fever, worse pain or if you can't keep down fluids.

## 2014-10-07 NOTE — ED Notes (Signed)
Pt reports to ED w/ c/o n/v/d.  Pt suspects it was something she ate.

## 2014-10-07 NOTE — ED Provider Notes (Signed)
[facility] Emergency Department Provider Note  ____________________________________________  Time seen: Approximately 9:14 AM  I have reviewed the triage vital signs and the nursing notes.   HISTORY  Chief Complaint   HPI Bianca Cantrell is a 29 y.o. female patient reports she ate an enchilada at 1:00 last night and sometime thereafter began having abdominal crampy pain nausea vomiting and diarrhea. Patient is unable to keep anything down. Patient is getting a little bit lightheaded.  Cramps are Quite bad. Patient is not running a fever. Patient's has no other complaints at this time.   Past Medical History  Diagnosis Date  . No pertinent past medical history   . Gallstones   . Chlamydia   . Anemia   . Sickle cell trait   . Infection     urinary tract infection  . Eczema   . Abnormal Pap smear     HPV  . Obesity     Patient Active Problem List   Diagnosis Date Noted  . Swelling, mass, or lump in chest 06/19/2013  . GERD without esophagitis 02/17/2013  . Condyloma acuminatum 11/19/2012  . Overweight and obesity(278.0) 05/30/2012  . General counseling for initiation of other contraceptive measures 05/30/2012    Past Surgical History  Procedure Laterality Date  . Cholecystectomy  2011  . Appendectomy  2010    Current Outpatient Rx  Name  Route  Sig  Dispense  Refill  . etonogestrel (NEXPLANON) 68 MG IMPL implant   Subdermal   1 each by Subdermal route once.         . butalbital-acetaminophen-caffeine (FIORICET) 50-325-40 MG per tablet   Oral   Take 1-2 tablets by mouth every 6 (six) hours as needed for headache. Patient not taking: Reported on 07/08/2014   20 tablet   0   . enoxaparin (LOVENOX) 150 MG/ML injection   Subcutaneous   Inject 0.73 mLs (110 mg total) into the skin every 12 (twelve) hours. Patient not taking: Reported on 08/09/2014   10 Syringe   0   . loratadine (CLARITIN) 10 MG tablet   Oral   Take 1 tablet (10 mg  total) by mouth daily.   20 tablet   0   . metroNIDAZOLE (FLAGYL) 500 MG tablet   Oral   Take 1 tablet (500 mg total) by mouth 2 (two) times daily. Patient not taking: Reported on 03/04/2014   14 tablet   2   . omeprazole (PRILOSEC) 20 MG capsule   Oral   Take 1 capsule (20 mg total) by mouth daily.   30 capsule   0   . ondansetron (ZOFRAN) 4 MG tablet      ODT   30 tablet   1   . phenol-menthol (CEPASTAT) 14.5 MG lozenge   Buccal   Place 1 lozenge inside Cantrell as needed for sore throat.   100 tablet   0   . polyethylene glycol (MIRALAX / GLYCOLAX) packet   Oral   Take 17 g by mouth daily. Patient not taking: Reported on 06/17/2014   14 each   0     Allergies Sulfa antibiotics; Sulfur; and Other  Family History  Problem Relation Age of Onset  . Hypertension Mother   . Heart disease Mother   . Hypertension Maternal Grandmother   . Heart disease Maternal Grandmother   . Other Neg Hx   . Cancer Maternal Aunt     leukemia  . Cancer Maternal Uncle  pancreatic    Social History History  Substance Use Topics  . Smoking status: Former Smoker -- 0.25 packs/day for 1 years    Types: Cigarettes    Quit date: 02/15/2013  . Smokeless tobacco: Never Used  . Alcohol Use: No    Review of Systems Constitutional: No fever/chills Eyes: No visual changes. ENT: No sore throat. Cardiovascular: Denies chest pain. Respiratory: Denies shortness of breath. Gastrointestinal: See H&P. Genitourinary: Negative for dysuria. Musculoskeletal: Negative for back pain. Skin: Negative for rash. Neurological: Negative for headaches, focal weakness or numbness.  10-point ROS otherwise negative.  ____________________________________________   PHYSICAL EXAM:  VITAL SIGNS: ED Triage Vitals  Enc Vitals Group     BP 10/07/14 0819 125/70 mmHg     Pulse Rate 10/07/14 0819 93     Resp 10/07/14 0819 16     Temp 10/07/14 0819 98.3 F (36.8 C)     Temp Source 10/07/14 0819  Oral     SpO2 10/07/14 0819 99 %     Weight 10/07/14 0819 260 lb (117.935 kg)     Height 10/07/14 0819 4\' 10"  (1.473 m)     Head Cir --      Peak Flow --      Pain Score 10/07/14 0820 10     Pain Loc --      Pain Edu? --      Excl. in GC? --     Constitutional: Alert and oriented. Patient reports crampy abdominal pain and looks like she's having some cramps Eyes: Conjunctivae are normal. PERRL. EOMI. Head: Atraumatic. Nose: No congestion/rhinnorhea. Mouth/Throat: Mucous membranes are moist.  Oropharynx non-erythematous. Neck: No stridor.  Cardiovascular: Normal rate, regular rhythm. Grossly normal heart sounds.  Good peripheral circulation. Respiratory: Normal respiratory effort.  No retractions. Lungs CTAB. Gastrointestinal: Soft and nontender. No distention. No abdominal bruits. No CVA tenderness. Increased bowel sounds. Palpation seems to stirrup her cramps. Musculoskeletal: No lower extremity tenderness nor edema.  No joint effusions. Neurologic:  Normal speech and language. No gross focal neurologic deficits are appreciated. No gait instability. Skin:  Skin is warm, dry and intact. No rash noted. Psychiatric: Mood and affect are normal. Speech and behavior are normal.  ____________________________________________   LABS (all labs ordered are listed, but only abnormal results are displayed)  Labs Reviewed  CBC WITH DIFFERENTIAL/PLATELET - Abnormal; Notable for the following:    Neutro Abs 8.5 (*)    All other components within normal limits  COMPREHENSIVE METABOLIC PANEL - Abnormal; Notable for the following:    Glucose, Bld 100 (*)    All other components within normal limits  URINALYSIS COMPLETEWITH MICROSCOPIC ([facility] ONLY) - Abnormal; Notable for the following:    Color, Urine STRAW (*)    APPearance CLEAR (*)    Squamous Epithelial / LPF 0-5 (*)    All other components within normal limits  LIPASE, BLOOD    ____________________________________________  EKG   ____________________________________________  RADIOLOGY  ____________________________________________   PROCEDURES   ____________________________________________   INITIAL IMPRESSION / ASSESSMENT AND PLAN / ED COURSE  Pertinent labs & imaging results that were available during my care of the patient were reviewed by me and considered in my medical decision making (see chart for details). Labs look okay. Patient feeling better after the morphine. No more vomiting cramps are improved patient is tolerating fluids by mouth well we'll discharge her with some Zofran and Pepto-Bismol  ____________________________________________   FINAL CLINICAL IMPRESSION(S) / ED DIAGNOSES  Final diagnoses:  Food  poisoning      Arnaldo Natal, MD 10/07/14 1116

## 2014-10-23 ENCOUNTER — Telehealth: Payer: Self-pay | Admitting: *Deleted

## 2014-10-23 NOTE — Telephone Encounter (Signed)
Patient contacted the office requesting to know when her Nexplanon was due to be removed.  Attempted to contact the patient and left message to notify the patient it was placed on 06/28/12 and due to be removed on 06/29/15. Advised patient to contact the office if she has any questions or concerns.

## 2014-11-14 ENCOUNTER — Encounter (HOSPITAL_COMMUNITY): Payer: Self-pay | Admitting: Emergency Medicine

## 2014-11-14 ENCOUNTER — Emergency Department (HOSPITAL_COMMUNITY)
Admission: EM | Admit: 2014-11-14 | Discharge: 2014-11-15 | Disposition: A | Discharge status: Home / Self Care | Payer: Medicaid Other | Attending: Emergency Medicine | Admitting: Emergency Medicine

## 2014-11-14 DIAGNOSIS — Z87891 Personal history of nicotine dependence: Secondary | ICD-10-CM | POA: Insufficient documentation

## 2014-11-14 DIAGNOSIS — Z79899 Other long term (current) drug therapy: Secondary | ICD-10-CM | POA: Insufficient documentation

## 2014-11-14 DIAGNOSIS — Z8619 Personal history of other infectious and parasitic diseases: Secondary | ICD-10-CM | POA: Insufficient documentation

## 2014-11-14 DIAGNOSIS — Z862 Personal history of diseases of the blood and blood-forming organs and certain disorders involving the immune mechanism: Secondary | ICD-10-CM | POA: Insufficient documentation

## 2014-11-14 DIAGNOSIS — Z9049 Acquired absence of other specified parts of digestive tract: Secondary | ICD-10-CM | POA: Insufficient documentation

## 2014-11-14 DIAGNOSIS — Z872 Personal history of diseases of the skin and subcutaneous tissue: Secondary | ICD-10-CM | POA: Insufficient documentation

## 2014-11-14 DIAGNOSIS — E669 Obesity, unspecified: Secondary | ICD-10-CM | POA: Insufficient documentation

## 2014-11-14 DIAGNOSIS — Z87440 Personal history of urinary (tract) infections: Secondary | ICD-10-CM | POA: Insufficient documentation

## 2014-11-14 DIAGNOSIS — K5900 Constipation, unspecified: Secondary | ICD-10-CM | POA: Insufficient documentation

## 2014-11-14 DIAGNOSIS — K5904 Chronic idiopathic constipation: Secondary | ICD-10-CM

## 2014-11-14 LAB — CBC
HCT: 39.9 % (ref 36.0–46.0)
Hemoglobin: 13.5 g/dL (ref 12.0–15.0)
MCH: 29.5 pg (ref 26.0–34.0)
MCHC: 33.8 g/dL (ref 30.0–36.0)
MCV: 87.3 fL (ref 78.0–100.0)
PLATELETS: 245 10*3/uL (ref 150–400)
RBC: 4.57 MIL/uL (ref 3.87–5.11)
RDW: 13.5 % (ref 11.5–15.5)
WBC: 12.8 10*3/uL — AB (ref 4.0–10.5)

## 2014-11-14 LAB — COMPREHENSIVE METABOLIC PANEL
ALT: 33 U/L (ref 14–54)
AST: 23 U/L (ref 15–41)
Albumin: 4 g/dL (ref 3.5–5.0)
Alkaline Phosphatase: 62 U/L (ref 38–126)
Anion gap: 7 (ref 5–15)
BILIRUBIN TOTAL: 0.4 mg/dL (ref 0.3–1.2)
BUN: 14 mg/dL (ref 6–20)
CO2: 27 mmol/L (ref 22–32)
CREATININE: 0.55 mg/dL (ref 0.44–1.00)
Calcium: 9.6 mg/dL (ref 8.9–10.3)
Chloride: 105 mmol/L (ref 101–111)
GFR calc Af Amer: >60 mL/min (ref >60)
Glucose, Bld: 102 mg/dL — ABNORMAL HIGH (ref 65–99)
Potassium: 3.6 mmol/L (ref 3.5–5.1)
Sodium: 139 mmol/L (ref 135–145)
TOTAL PROTEIN: 7.9 g/dL (ref 6.5–8.1)

## 2014-11-14 LAB — I-STAT BETA HCG BLOOD, ED (MC, WL, AP ONLY)

## 2014-11-14 LAB — LIPASE, BLOOD: Lipase: 28 U/L (ref 22–51)

## 2014-11-14 NOTE — ED Notes (Signed)
Pt from home cc/o bloating and constipation. She reports she has been only passing "pebbles".  She states " I fell like my insides are stretching".

## 2014-11-15 ENCOUNTER — Emergency Department (HOSPITAL_COMMUNITY): Payer: Medicaid Other

## 2014-11-15 LAB — URINALYSIS, ROUTINE W REFLEX MICROSCOPIC
Bilirubin Urine: NEGATIVE
GLUCOSE, UA: NEGATIVE mg/dL
Hgb urine dipstick: NEGATIVE
Ketones, ur: NEGATIVE mg/dL
LEUKOCYTES UA: NEGATIVE
Nitrite: NEGATIVE
PROTEIN: NEGATIVE mg/dL
Specific Gravity, Urine: 1.015 (ref 1.005–1.030)
Urobilinogen, UA: 0.2 mg/dL (ref 0.0–1.0)
pH: 6 (ref 5.0–8.0)

## 2014-11-15 MED ORDER — MAGNESIUM CITRATE PO SOLN
1.0000 | Freq: Once | ORAL | Status: AC
  Administered 2014-11-15: 1 via ORAL
  Filled 2014-11-15: qty 296

## 2014-11-15 NOTE — ED Provider Notes (Signed)
CSN: [id number]     Arrival date & time 11/14/14  2203 History  This chart was scribed for Dione Booze, MD by Octavia Heir, ED Scribe. This patient was seen in room WA04/WA04 and the patient's care was started at 1:16 AM.    Chief Complaint  Patient presents with  . Constipation  . Abdominal Pain      The history is provided by the patient. No language interpreter was used.   HPI Comments: Bianca Cantrell is a 29 y.o. female has a hx of gallstones and anemia who presents to the Emergency Department complaining of constant, gradual worsening constipation onset 5 days ago. She has had urinary urgency, bloating, and associated left sided abdominal pain that is painful when she turns and takes a deep breath. Pt has been eating prunes, drinking prune juice and drinking water to alleviate the symptoms with no relief. Pt denies nausea, vomiting, dysuria, and urinary frequency.  Past Medical History  Diagnosis Date  . No pertinent past medical history   . Gallstones   . Chlamydia   . Anemia   . Sickle cell trait   . Infection     urinary tract infection  . Eczema   . Abnormal Pap smear     HPV  . Obesity    Past Surgical History  Procedure Laterality Date  . Cholecystectomy  2011  . Appendectomy  2010   Family History  Problem Relation Age of Onset  . Hypertension Mother   . Heart disease Mother   . Hypertension Maternal Grandmother   . Heart disease Maternal Grandmother   . Other Neg Hx   . Cancer Maternal Aunt     leukemia  . Cancer Maternal Uncle     pancreatic   Social History  Substance Use Topics  . Smoking status: Former Smoker -- 0.25 packs/day for 1 years    Types: Cigarettes    Quit date: 02/15/2013  . Smokeless tobacco: Never Used  . Alcohol Use: No   OB History    Gravida Para Term Preterm AB TAB SAB Ectopic Multiple Living   4 4 4  0 0 0 0 0 0 4     Review of Systems  Gastrointestinal: Positive for abdominal pain. Negative for nausea and vomiting.   Genitourinary: Positive for urgency. Negative for dysuria and frequency.  All other systems reviewed and are negative.     Allergies  Sulfa antibiotics; Sulfur; and Other  Home Medications   Prior to Admission medications   Medication Sig Start Date End Date Taking? Authorizing Provider  butalbital-acetaminophen-caffeine (FIORICET) 50-325-40 MG per tablet Take 1-2 tablets by mouth every 6 (six) hours as needed for headache. Patient not taking: Reported on 07/08/2014 01/11/14 01/11/15  Joni Reining Pisciotta, PA-C  enoxaparin (LOVENOX) 150 MG/ML injection Inject 0.73 mLs (110 mg total) into the skin every 12 (twelve) hours. Patient not taking: Reported on 08/09/2014 07/08/14   Harle Battiest, NP  etonogestrel (NEXPLANON) 68 MG IMPL implant 1 each by Subdermal route once.    Historical Provider, MD  loratadine (CLARITIN) 10 MG tablet Take 1 tablet (10 mg total) by mouth daily. 08/09/14   Kaitlyn Szekalski, PA-C  metroNIDAZOLE (FLAGYL) 500 MG tablet Take 1 tablet (500 mg total) by mouth 2 (two) times daily. Patient not taking: Reported on 03/04/2014 02/12/14   Brock Bad, MD  omeprazole (PRILOSEC) 20 MG capsule Take 1 capsule (20 mg total) by mouth daily. 08/26/14   Gerhard Munch, MD  ondansetron (ZOFRAN) 4 MG  tablet ODT 10/07/14   Arnaldo Natal, MD  phenol-menthol (CEPASTAT) 14.5 MG lozenge Place 1 lozenge inside cheek as needed for sore throat. 08/09/14   Kaitlyn Szekalski, PA-C  polyethylene glycol (MIRALAX / GLYCOLAX) packet Take 17 g by mouth daily. Patient not taking: Reported on 06/17/2014 03/04/14   Arby Barrette, MD   Triage vitals: BP 136/92 mmHg  Pulse 100  Temp(Src) 98.1 F (36.7 C) (Oral)  Resp 16  SpO2 99% Physical Exam  Constitutional: She is oriented to person, place, and time. She appears well-developed and well-nourished. No distress.  HENT:  Head: Normocephalic and atraumatic.  Eyes: EOM are normal. Pupils are equal, round, and reactive to light.  Neck: Normal range of  motion. Neck supple. No JVD present.  Cardiovascular: Normal rate, regular rhythm and normal heart sounds.   Pulmonary/Chest: Effort normal and breath sounds normal. No respiratory distress. She has no wheezes. She exhibits no tenderness.  Abdominal: Soft. She exhibits no distension and no mass. There is tenderness.  Mild tenderness left mid abdomen, no rebound, no guarding  Genitourinary:  Normal sphincter tone, no stool present  Musculoskeletal: Normal range of motion. She exhibits no edema.  Lymphadenopathy:    She has no cervical adenopathy.  Neurological: She is alert and oriented to person, place, and time. No cranial nerve deficit. She exhibits normal muscle tone. Coordination normal.  Skin: Skin is warm and dry. No rash noted.  Psychiatric: She has a normal mood and affect. Her behavior is normal. Judgment and thought content normal.  Nursing note and vitals reviewed.   ED Course  Procedures  DIAGNOSTIC STUDIES: Oxygen Saturation is 99% on RA, normal by my interpretation.  COORDINATION OF CARE:  1:18 AM Discussed treatment plan which includes urinalysis with pt at bedside and pt agreed to plan.  Labs Review Results for orders placed or performed during the hospital encounter of 11/14/14  Lipase, blood  Result Value Ref Range   Lipase 28 22 - 51 U/L  Comprehensive metabolic panel  Result Value Ref Range   Sodium 139 135 - 145 mmol/L   Potassium 3.6 3.5 - 5.1 mmol/L   Chloride 105 101 - 111 mmol/L   CO2 27 22 - 32 mmol/L   Glucose, Bld 102 (H) 65 - 99 mg/dL   BUN 14 6 - 20 mg/dL   Creatinine, Ser 1.61 0.44 - 1.00 mg/dL   Calcium 9.6 8.9 - 09.6 mg/dL   Total Protein 7.9 6.5 - 8.1 g/dL   Albumin 4.0 3.5 - 5.0 g/dL   AST 23 15 - 41 U/L   ALT 33 14 - 54 U/L   Alkaline Phosphatase 62 38 - 126 U/L   Total Bilirubin 0.4 0.3 - 1.2 mg/dL   GFR calc non Af Amer >60 >60 mL/min   GFR calc Af Amer >60 >60 mL/min   Anion gap 7 5 - 15  CBC  Result Value Ref Range   WBC 12.8  (H) 4.0 - 10.5 K/uL   RBC 4.57 3.87 - 5.11 MIL/uL   Hemoglobin 13.5 12.0 - 15.0 g/dL   HCT 04.5 40.9 - 81.1 %   MCV 87.3 78.0 - 100.0 fL   MCH 29.5 26.0 - 34.0 pg   MCHC 33.8 30.0 - 36.0 g/dL   RDW 91.4 78.2 - 95.6 %   Platelets 245 150 - 400 K/uL  Urinalysis, Routine w reflex microscopic (not at [facility])  Result Value Ref Range   Color, Urine YELLOW YELLOW   APPearance CLEAR  CLEAR   Specific Gravity, Urine 1.015 1.005 - 1.030   pH 6.0 5.0 - 8.0   Glucose, UA NEGATIVE NEGATIVE mg/dL   Hgb urine dipstick NEGATIVE NEGATIVE   Bilirubin Urine NEGATIVE NEGATIVE   Ketones, ur NEGATIVE NEGATIVE mg/dL   Protein, ur NEGATIVE NEGATIVE mg/dL   Urobilinogen, UA 0.2 0.0 - 1.0 mg/dL   Nitrite NEGATIVE NEGATIVE   Leukocytes, UA NEGATIVE NEGATIVE  I-Stat beta hCG blood, ED ([facility], [facility], [facility] only)  Result Value Ref Range   I-stat hCG, quantitative <5.0 <5 mIU/mL   Comment 3           Imaging Review Dg Abd 1 View  11/15/2014   CLINICAL DATA:  29 year old female with constipation  EXAM: ABDOMEN - 1 VIEW  COMPARISON:  Radiograph dated 04/15/2013  FINDINGS: Moderate stool noted throughout the colon. There is no evidence of bowel obstruction. No free air. No radiopaque calculi identified. Right upper quadrant cholecystectomy clips. A single surgical clip noted in the right lower hemiabdomen possibly a dislodged cholecystectomy clips. The osseous structures appear unremarkable.  IMPRESSION: Constipation.  No bowel obstruction.   Electronically Signed   By: Elgie Collard M.D.   On: 11/15/2014 02:43   I have personally reviewed and evaluated these images and lab results as part of my medical decision-making.  MDM   Final diagnoses:  Constipation - functional    Constipation without evidence of impaction. WBC is modestly elevated but she has a benign abdominal exam. Some symptoms were suggestive of possible UTI, but urinalysis was normal. Abdominal x-ray shows significant stool burden. She is given a dose of  magnesium citrate and is advised to increase the fiber in her diet. Old records were reviewed and there are no relevant past visits.  I personally performed the services described in this documentation, which was scribed in my presence. The recorded information has been reviewed and is accurate.     Dione Booze, MD 11/15/14 [id number]

## 2014-11-15 NOTE — Discharge Instructions (Signed)

## 2014-11-16 IMAGING — CR DG CHEST 2V
2 series · 2 of 2 positions shown · non-contrast
Comparison: 04/15/2013

CLINICAL DATA: Shortness of breath for 1 day.

EXAM:
CHEST  2 VIEW

[w chest pa]
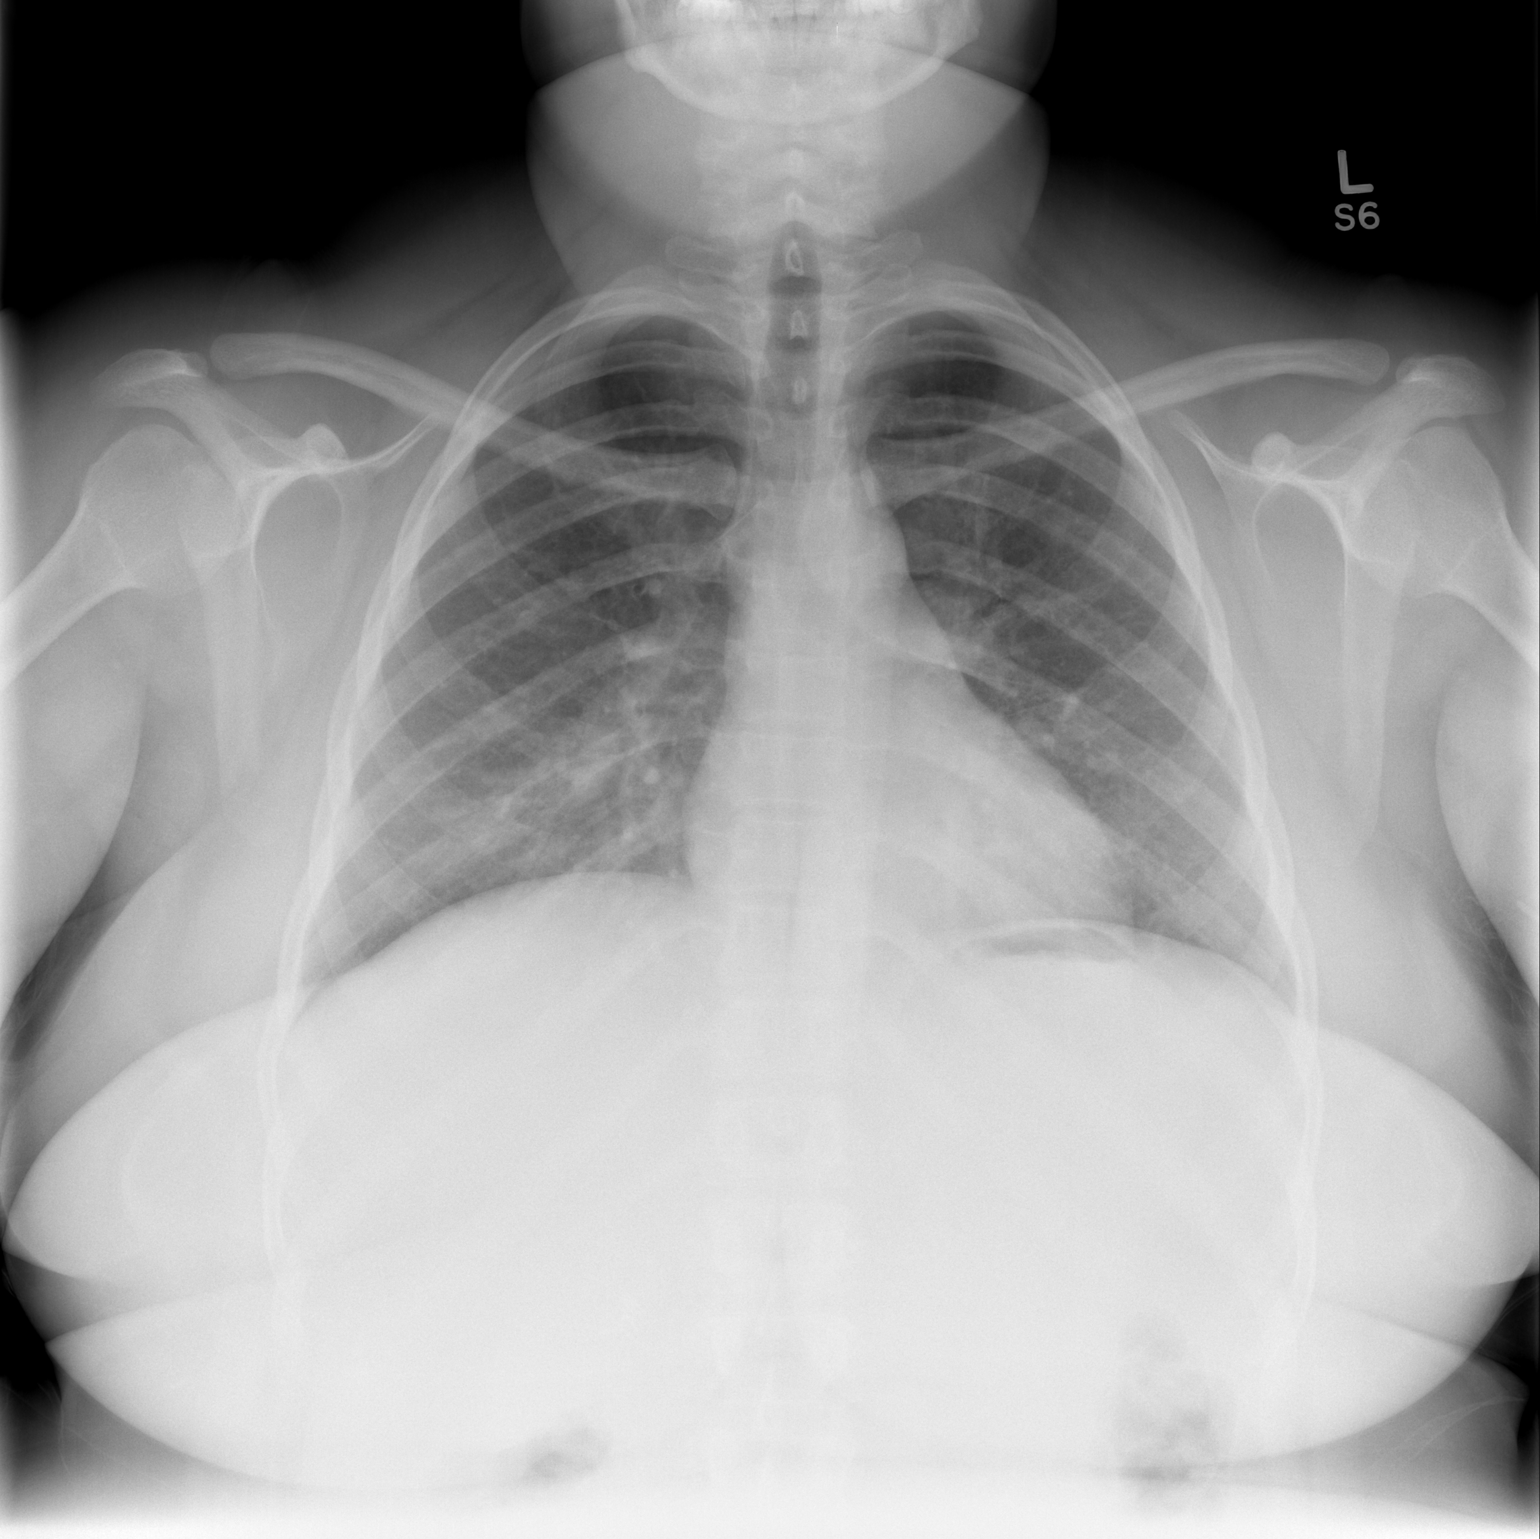

[w chest lat]
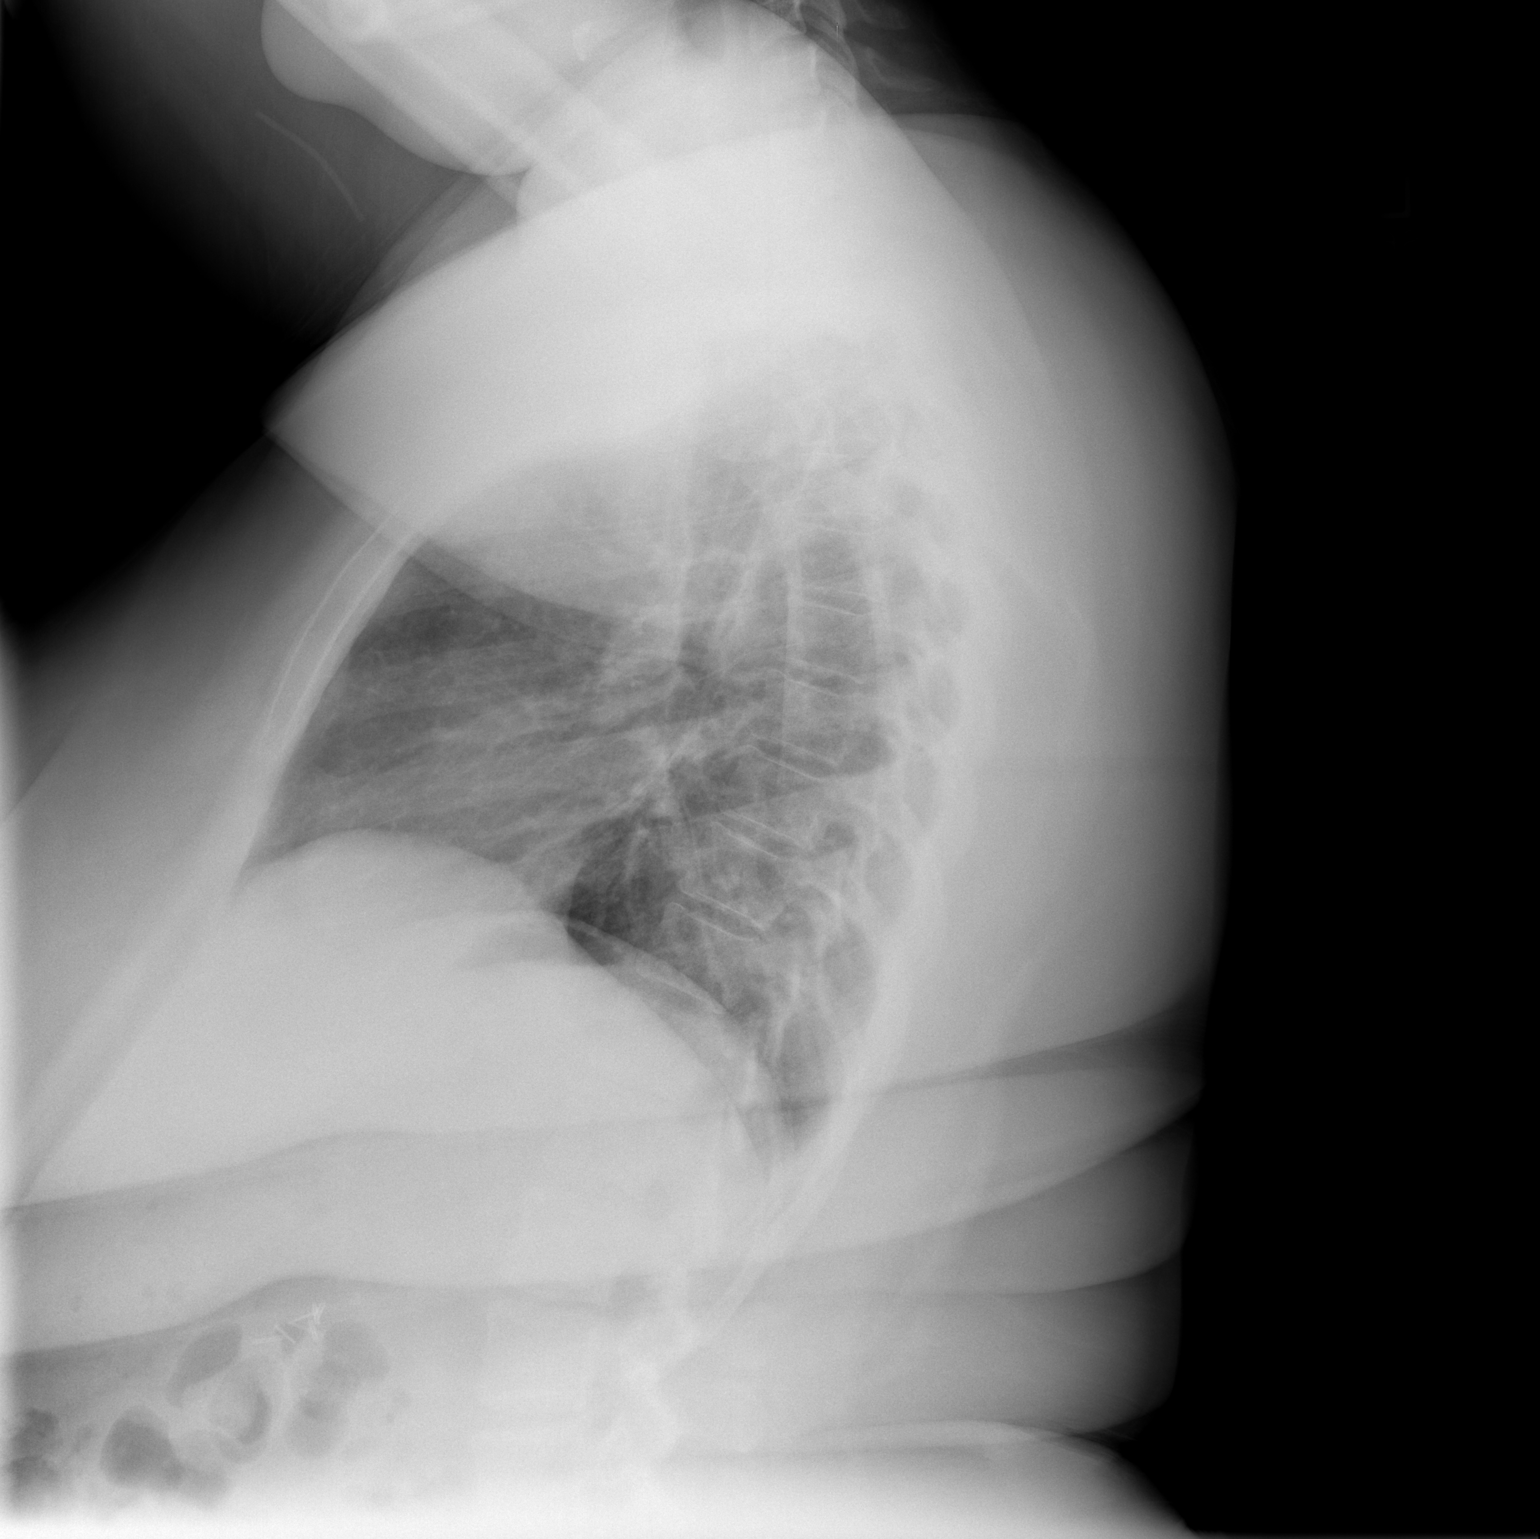

[2 of 2 positions shown; findings below may reference images not displayed]

FINDINGS: Low lung volumes noted however both lungs are clear. No evidence of
pleural effusion. Heart size mediastinal contours are within normal
limits. No significant change compared to prior exam.
IMPRESSION: No active cardiopulmonary disease.

## 2014-12-10 ENCOUNTER — Ambulatory Visit: Payer: Medicaid Other | Admitting: Obstetrics

## 2015-02-03 ENCOUNTER — Ambulatory Visit (INDEPENDENT_AMBULATORY_CARE_PROVIDER_SITE_OTHER): Payer: BLUE CROSS/BLUE SHIELD | Admitting: Obstetrics

## 2015-02-03 ENCOUNTER — Encounter: Payer: Self-pay | Admitting: Obstetrics

## 2015-02-03 VITALS — BP 127/84 | HR 94 | Wt 268.0 lb

## 2015-02-03 DIAGNOSIS — Z01419 Encounter for gynecological examination (general) (routine) without abnormal findings: Secondary | ICD-10-CM | POA: Diagnosis not present

## 2015-02-03 DIAGNOSIS — N760 Acute vaginitis: Secondary | ICD-10-CM

## 2015-02-03 LAB — POCT URINALYSIS DIPSTICK
BILIRUBIN UA: NEGATIVE
Blood, UA: NEGATIVE
GLUCOSE UA: NEGATIVE
KETONES UA: NEGATIVE
Leukocytes, UA: NEGATIVE
NITRITE UA: NEGATIVE
Protein, UA: NEGATIVE
Spec Grav, UA: <=1.005
UROBILINOGEN UA: NEGATIVE
pH, UA: 6.5

## 2015-02-03 MED ORDER — FLUCONAZOLE 200 MG PO TABS: ORAL_TABLET | ORAL | Status: DC

## 2015-02-03 NOTE — Addendum Note (Signed)
Addended by: FOSTER, SUZANNE D on: 02/03/2015 04:17 PM   Modules accepted: Orders  

## 2015-02-03 NOTE — Progress Notes (Signed)
Patient ID: Bianca Cantrell, female   DOB: 1985-03-18, 29 y.o.   MRN: [id number]  Chief Complaint  Patient presents with  . Vaginitis    vaginal irritation    HPI Bianca Cantrell is a 29 y.o. female.  Vaginal irritation and discharge.  Suspect exposure to STD. HPI  Past Medical History  Diagnosis Date  . No pertinent past medical history   . Gallstones   . Chlamydia   . Anemia   . Sickle cell trait (HCC)   . Infection     urinary tract infection  . Eczema   . Abnormal Pap smear     HPV  . Obesity     Past Surgical History  Procedure Laterality Date  . Cholecystectomy  2011  . Appendectomy  2010    Family History  Problem Relation Age of Onset  . Hypertension Mother   . Heart disease Mother   . Hypertension Maternal Grandmother   . Heart disease Maternal Grandmother   . Other Neg Hx   . Cancer Maternal Aunt     leukemia  . Cancer Maternal Uncle     pancreatic    Social History Social History  Substance Use Topics  . Smoking status: Former Smoker -- 0.25 packs/day for 1 years    Types: Cigarettes    Quit date: 02/15/2013  . Smokeless tobacco: Never Used  . Alcohol Use: No    Allergies  Allergen Reactions  . Sulfa Antibiotics Anaphylaxis and Hives  . Sulfur Anaphylaxis  . Other Hives    Mayonnaise     Current Outpatient Prescriptions  Medication Sig Dispense Refill  . etonogestrel (NEXPLANON) 68 MG IMPL implant 1 each by Subdermal route once. Placed 06/2012    . fluconazole (DIFLUCAN) 200 MG tablet Take 1 tablet po every other day. 3 tablet 2  . [DISCONTINUED] enoxaparin (LOVENOX) 150 MG/ML injection Inject 0.73 mLs (110 mg total) into the skin every 12 (twelve) hours. (Patient not taking: Reported on 08/09/2014) 10 Syringe 0  . [DISCONTINUED] loratadine (CLARITIN) 10 MG tablet Take 1 tablet (10 mg total) by mouth daily. (Patient not taking: Reported on 11/15/2014) 20 tablet 0   No current facility-administered medications for this visit.    Review  of Systems Review of Systems Constitutional: negative for fatigue and weight loss Respiratory: negative for cough and wheezing Cardiovascular: negative for chest pain, fatigue and palpitations Gastrointestinal: negative for abdominal pain and change in bowel habits Genitourinary: positive for vaginal irritation and discharge. Suspect exposure to STD Integument/breast: negative for nipple discharge Musculoskeletal:negative for myalgias Neurological: negative for gait problems and tremors Behavioral/Psych: negative for abusive relationship, depression Endocrine: negative for temperature intolerance     Blood pressure 127/84, pulse 94, weight 268 lb (121.564 kg).  Physical Exam Physical Exam General:   alert  Skin:   no rash or abnormalities  Lungs:   clear to auscultation bilaterally  Heart:   regular rate and rhythm, S1, S2 normal, no murmur, click, rub or gallop  Breasts:   normal without suspicious masses, skin or nipple changes or axillary nodes  Abdomen:  normal findings: no organomegaly, soft, non-tender and no hernia  Pelvis:  External genitalia: normal general appearance Urinary system: urethral meatus normal and bladder without fullness, nontender Vaginal: normal without tenderness, induration or masses.  Clumpy, white discharge present Cervix: normal appearance Adnexa: normal bimanual exam Uterus: anteverted and non-tender, normal size      Data Reviewed Labs Last pap smear  Assessment  Vaginitis.  Probably Candida. Routine Gyn Exam    Plan    Wet prep and cultures sent. Pap Smear and Routine Gyn Exam done. F/U 1 year or prn.  Orders Placed This Encounter  Procedures  . SureSwab, Vaginosis/Vaginitis Plus  . POCT urinalysis dipstick   Meds ordered this encounter  Medications  . fluconazole (DIFLUCAN) 200 MG tablet    Sig: Take 1 tablet po every other day.    Dispense:  3 tablet    Refill:  2

## 2015-02-04 LAB — PAP IG W/ RFLX HPV ASCU

## 2015-02-07 LAB — SURESWAB, VAGINOSIS/VAGINITIS PLUS
ATOPOBIUM VAGINAE: NOT DETECTED Log (cells/mL)
C. ALBICANS, DNA: DETECTED — AB
C. TRACHOMATIS RNA, TMA: NOT DETECTED
C. glabrata, DNA: NOT DETECTED
C. parapsilosis, DNA: NOT DETECTED
C. tropicalis, DNA: NOT DETECTED
Gardnerella vaginalis: 7.4 Log (cells/mL)
LACTOBACILLUS SPECIES: NOT DETECTED Log (cells/mL)
MEGASPHAERA SPECIES: NOT DETECTED Log (cells/mL)
N. gonorrhoeae RNA, TMA: NOT DETECTED
T. vaginalis RNA, QL TMA: NOT DETECTED

## 2015-02-08 ENCOUNTER — Other Ambulatory Visit: Payer: Self-pay | Admitting: Obstetrics

## 2015-02-08 DIAGNOSIS — N760 Acute vaginitis: Principal | ICD-10-CM

## 2015-02-08 DIAGNOSIS — B9689 Other specified bacterial agents as the cause of diseases classified elsewhere: Secondary | ICD-10-CM

## 2015-02-08 MED ORDER — METRONIDAZOLE 500 MG PO TABS: 500.0000 mg | ORAL_TABLET | Freq: Two times a day (BID) | ORAL | Status: DC

## 2015-04-30 ENCOUNTER — Inpatient Hospital Stay (HOSPITAL_COMMUNITY)
Admission: AD | Admit: 2015-04-30 | Discharge: 2015-04-30 | Disposition: A | Discharge status: Home / Self Care | Payer: No Typology Code available for payment source | Source: Ambulatory Visit | Attending: Obstetrics | Admitting: Obstetrics

## 2015-04-30 ENCOUNTER — Encounter (HOSPITAL_COMMUNITY): Payer: Self-pay | Admitting: *Deleted

## 2015-04-30 DIAGNOSIS — R51 Headache: Secondary | ICD-10-CM

## 2015-04-30 DIAGNOSIS — R519 Headache, unspecified: Secondary | ICD-10-CM

## 2015-04-30 DIAGNOSIS — F17210 Nicotine dependence, cigarettes, uncomplicated: Secondary | ICD-10-CM | POA: Insufficient documentation

## 2015-04-30 LAB — URINALYSIS, ROUTINE W REFLEX MICROSCOPIC
BILIRUBIN URINE: NEGATIVE
Glucose, UA: NEGATIVE mg/dL
HGB URINE DIPSTICK: NEGATIVE
Ketones, ur: NEGATIVE mg/dL
Leukocytes, UA: NEGATIVE
Nitrite: NEGATIVE
PROTEIN: NEGATIVE mg/dL
Specific Gravity, Urine: <1.005 — ABNORMAL LOW (ref 1.005–1.030)
pH: 5.5 (ref 5.0–8.0)

## 2015-04-30 LAB — POCT PREGNANCY, URINE: Preg Test, Ur: NEGATIVE

## 2015-04-30 MED ORDER — DIPHENHYDRAMINE HCL 25 MG PO CAPS
25.0000 mg | ORAL_CAPSULE | Freq: Once | ORAL | Status: AC
  Administered 2015-04-30: 25 mg via ORAL
  Filled 2015-04-30: qty 1

## 2015-04-30 MED ORDER — IBUPROFEN 800 MG PO TABS: 800.0000 mg | ORAL_TABLET | Freq: Three times a day (TID) | ORAL | Status: DC

## 2015-04-30 MED ORDER — IBUPROFEN 800 MG PO TABS
800.0000 mg | ORAL_TABLET | Freq: Once | ORAL | Status: AC
  Administered 2015-04-30: 800 mg via ORAL
  Filled 2015-04-30: qty 1

## 2015-04-30 NOTE — MAU Note (Signed)
Having real bad headaches for a week and a half. Taking Tylenol that helps awhile but then headache comes right back. Denies vag bleeding or d/c. Have nexplanon that was due to come out last Aug but still in currently

## 2015-04-30 NOTE — Discharge Instructions (Signed)

## 2015-04-30 NOTE — MAU Provider Note (Signed)
History     CSN: [id number]  Arrival date and time: 04/30/15 4098   First Provider Initiated Contact with Patient 04/30/15 2024      Chief Complaint  Patient presents with  . Headache    Ms.Bianca Cantrell is a 30 y.o. female [phone] presenting to MAU with HA; this is a new problem.  Patient denies history of HTN; she does have obesity.   Headache  This is a new problem. The current episode started in the past 7 days. The problem occurs daily. The problem has been unchanged. The pain is located in the occipital and frontal region. The pain does not radiate. The pain quality is not similar to prior headaches. The quality of the pain is described as throbbing. The pain is at a severity of 5/10. Associated symptoms include nausea. Pertinent negatives include no blurred vision, ear pain, fever, photophobia or vomiting. The symptoms are aggravated by activity. She has tried acetaminophen for the symptoms. The treatment provided moderate relief.     OB History    Gravida Para Term Preterm AB TAB SAB Ectopic Multiple Living   0 0 0 0 0 0 4      Past Medical History  Diagnosis Date  . No pertinent past medical history   . Gallstones   . Chlamydia   . Anemia   . Sickle cell trait (HCC)   . Infection     urinary tract infection  . Eczema   . Abnormal Pap smear     HPV  . Obesity     Past Surgical History  Procedure Laterality Date  . Cholecystectomy  2011  . Appendectomy  2010    Family History  Problem Relation Age of Onset  . Hypertension Mother   . Heart disease Mother   . Hypertension Maternal Grandmother   . Heart disease Maternal Grandmother   . Other Neg Hx   . Cancer Maternal Aunt     leukemia  . Cancer Maternal Uncle     pancreatic    Social History  Substance Use Topics  . Smoking status: Current Some Day Smoker -- 0.25 packs/day for 1 years    Types: Cigarettes    Last Attempt to Quit: 02/15/2013  . Smokeless tobacco: Never Used  . Alcohol  Use: No    Allergies:  Allergies  Allergen Reactions  . Sulfa Antibiotics Anaphylaxis and Hives  . Sulfur Anaphylaxis  . Other Hives    Mayonnaise     Prescriptions prior to admission  Medication Sig Dispense Refill Last Dose  . acetaminophen (TYLENOL) 325 MG tablet Take 650 mg by mouth every 6 (six) hours as needed.   04/30/2015 at Unknown time  . etonogestrel (NEXPLANON) 68 MG IMPL implant 1 each by Subdermal route once. Placed 06/2012   04/30/2015 at Unknown time  . fluconazole (DIFLUCAN) 200 MG tablet Take 1 tablet po every other day. 3 tablet 2   . metroNIDAZOLE (FLAGYL) 500 MG tablet Take 1 tablet (500 mg total) by mouth 2 (two) times daily. 14 tablet 2   . Multiple Vitamins-Minerals (MULTIVITAMIN WITH MINERALS) tablet Take 1 tablet by mouth daily.      Results for orders placed or performed during the hospital encounter of 04/30/15 (from the past 48 hour(s))  Urinalysis, Routine w reflex microscopic (not at [facility])     Status: Abnormal   Collection Time: 04/30/15  8:05 PM  Result Value Ref Range   Color, Urine YELLOW YELLOW  APPearance CLEAR CLEAR   Specific Gravity, Urine <1.005 (L) 1.005 - 1.030   pH 5.5 5.0 - 8.0   Glucose, UA NEGATIVE NEGATIVE mg/dL   Hgb urine dipstick NEGATIVE NEGATIVE   Bilirubin Urine NEGATIVE NEGATIVE   Ketones, ur NEGATIVE NEGATIVE mg/dL   Protein, ur NEGATIVE NEGATIVE mg/dL   Nitrite NEGATIVE NEGATIVE   Leukocytes, UA NEGATIVE NEGATIVE    Comment: MICROSCOPIC NOT DONE ON URINES WITH NEGATIVE PROTEIN, BLOOD, LEUKOCYTES, NITRITE, OR GLUCOSE <1000 mg/dL.  Pregnancy, urine POC     Status: None   Collection Time: 04/30/15  8:17 PM  Result Value Ref Range   Preg Test, Ur NEGATIVE NEGATIVE    Comment:        THE SENSITIVITY OF THIS METHODOLOGY IS >24 mIU/mL     Review of Systems  Constitutional: Negative for fever.  HENT: Negative for ear pain.   Eyes: Negative for blurred vision, double vision and photophobia.  Cardiovascular: Negative for  chest pain.  Gastrointestinal: Positive for nausea. Negative for vomiting.  Neurological: Positive for headaches.   Physical Exam   Blood pressure 135/88, pulse 96, temperature 98.3 F (36.8 C), resp. rate 18, height 4' 10.5" (1.486 m), weight 273 lb 9.6 oz (124.104 kg).  Physical Exam  Constitutional: She is oriented to person, place, and time. She appears well-developed and well-nourished. She appears lethargic.  Non-toxic appearance. She does not have a sickly appearance. She does not appear ill. No distress.  Cardiovascular: Normal rate and normal heart sounds.   Respiratory: Effort normal.  Musculoskeletal: Normal range of motion.  Neurological: She is oriented to person, place, and time. She appears lethargic. GCS eye subscore is 4. GCS verbal subscore is 5. GCS motor subscore is 6.  Skin: She is not diaphoretic.    MAU Course  Procedures  None  MDM Ibuprofen 800 mg Benadryl 25 mg PO Patient states improvement in HA Pain; patients pain down to a 4/10 from 6/10  Assessment and Plan   A:  1. Headache in back of head    P:  Discharge home in stable condition RX: Ibuprofen Ok to take benadryl at home, over the counter as needed Return to MAU for emergencies If symptoms worsen, go to [facility] [employer] or [facility].    Duane Lope, NP 04/30/2015 11:26 PM

## 2015-05-01 ENCOUNTER — Encounter (HOSPITAL_COMMUNITY): Payer: Self-pay | Admitting: *Deleted

## 2015-05-01 ENCOUNTER — Emergency Department (HOSPITAL_COMMUNITY): Payer: Self-pay

## 2015-05-01 ENCOUNTER — Emergency Department (HOSPITAL_COMMUNITY)
Admission: EM | Admit: 2015-05-01 | Discharge: 2015-05-01 | Disposition: A | Discharge status: Home / Self Care | Payer: Self-pay | Attending: Emergency Medicine | Admitting: Emergency Medicine

## 2015-05-01 DIAGNOSIS — Z862 Personal history of diseases of the blood and blood-forming organs and certain disorders involving the immune mechanism: Secondary | ICD-10-CM | POA: Insufficient documentation

## 2015-05-01 DIAGNOSIS — Z793 Long term (current) use of hormonal contraceptives: Secondary | ICD-10-CM | POA: Insufficient documentation

## 2015-05-01 DIAGNOSIS — Z791 Long term (current) use of non-steroidal anti-inflammatories (NSAID): Secondary | ICD-10-CM | POA: Insufficient documentation

## 2015-05-01 DIAGNOSIS — E669 Obesity, unspecified: Secondary | ICD-10-CM | POA: Insufficient documentation

## 2015-05-01 DIAGNOSIS — Z8619 Personal history of other infectious and parasitic diseases: Secondary | ICD-10-CM | POA: Insufficient documentation

## 2015-05-01 DIAGNOSIS — Z87440 Personal history of urinary (tract) infections: Secondary | ICD-10-CM | POA: Insufficient documentation

## 2015-05-01 DIAGNOSIS — R110 Nausea: Secondary | ICD-10-CM | POA: Insufficient documentation

## 2015-05-01 DIAGNOSIS — F17210 Nicotine dependence, cigarettes, uncomplicated: Secondary | ICD-10-CM | POA: Insufficient documentation

## 2015-05-01 DIAGNOSIS — Z872 Personal history of diseases of the skin and subcutaneous tissue: Secondary | ICD-10-CM | POA: Insufficient documentation

## 2015-05-01 DIAGNOSIS — Z8719 Personal history of other diseases of the digestive system: Secondary | ICD-10-CM | POA: Insufficient documentation

## 2015-05-01 DIAGNOSIS — H538 Other visual disturbances: Secondary | ICD-10-CM | POA: Insufficient documentation

## 2015-05-01 DIAGNOSIS — Z79899 Other long term (current) drug therapy: Secondary | ICD-10-CM | POA: Insufficient documentation

## 2015-05-01 DIAGNOSIS — R51 Headache: Secondary | ICD-10-CM | POA: Insufficient documentation

## 2015-05-01 DIAGNOSIS — R519 Headache, unspecified: Secondary | ICD-10-CM

## 2015-05-01 MED ORDER — METOCLOPRAMIDE HCL 5 MG/ML IJ SOLN
10.0000 mg | Freq: Once | INTRAMUSCULAR | Status: AC
  Administered 2015-05-01: 10 mg via INTRAVENOUS
  Filled 2015-05-01: qty 2

## 2015-05-01 MED ORDER — DEXAMETHASONE SODIUM PHOSPHATE 10 MG/ML IJ SOLN
10.0000 mg | Freq: Once | INTRAMUSCULAR | Status: AC
  Administered 2015-05-01: 10 mg via INTRAVENOUS
  Filled 2015-05-01: qty 1

## 2015-05-01 MED ORDER — KETOROLAC TROMETHAMINE 30 MG/ML IJ SOLN
30.0000 mg | Freq: Once | INTRAMUSCULAR | Status: AC
  Administered 2015-05-01: 30 mg via INTRAVENOUS
  Filled 2015-05-01: qty 1

## 2015-05-01 MED ORDER — SODIUM CHLORIDE 0.9 % IV BOLUS (SEPSIS)
1000.0000 mL | Freq: Once | INTRAVENOUS | Status: AC
  Administered 2015-05-01: 1000 mL via INTRAVENOUS

## 2015-05-01 MED ORDER — DIPHENHYDRAMINE HCL 50 MG/ML IJ SOLN
25.0000 mg | Freq: Once | INTRAMUSCULAR | Status: AC
  Administered 2015-05-01: 25 mg via INTRAVENOUS
  Filled 2015-05-01: qty 1

## 2015-05-01 NOTE — ED Notes (Signed)
Pt discharged with all belongings. All paperwork and follow-up instructions discussed, pt verbalized understanding.

## 2015-05-01 NOTE — ED Provider Notes (Signed)
CSN: [id number]     Arrival date & time 05/01/15  1308 History   First MD Initiated Contact with Patient 05/01/15 1327     Chief Complaint  Patient presents with  . Headache  . Nausea     (Consider location/radiation/quality/duration/timing/severity/associated sxs/prior Treatment) HPI Comments: Patient is a 30 year old female with history of anemia, gallstones. She presents today with complaints of headache that is frontal in nature.  She reports blurry vision, but no fever, stiff neck. She denies a history of migraines.  Ibuprofen and tylenol help, but the headache comes right back.  Patient is a 30 y.o. female presenting with headaches. The history is provided by the patient.  Headache Pain location:  Frontal Quality:  Dull Radiates to:  Does not radiate Onset quality:  Sudden Duration:  9 days Timing:  Constant Progression:  Unchanged Chronicity:  New Context: activity and bright light   Relieved by:  Nothing Worsened by:  Nothing Ineffective treatments:  Acetaminophen and NSAIDs Associated symptoms: blurred vision   Associated symptoms: no congestion, no cough, no dizziness, no fever, no numbness and no tingling     Past Medical History  Diagnosis Date  . No pertinent past medical history   . Gallstones   . Chlamydia   . Anemia   . Sickle cell trait (HCC)   . Infection     urinary tract infection  . Eczema   . Abnormal Pap smear     HPV  . Obesity    Past Surgical History  Procedure Laterality Date  . Cholecystectomy  2011  . Appendectomy  2010   Family History  Problem Relation Age of Onset  . Hypertension Mother   . Heart disease Mother   . Hypertension Maternal Grandmother   . Heart disease Maternal Grandmother   . Other Neg Hx   . Cancer Maternal Aunt     leukemia  . Cancer Maternal Uncle     pancreatic   Social History  Substance Use Topics  . Smoking status: Current Some Day Smoker -- 0.25 packs/day for 1 years    Types: Cigarettes    Last  Attempt to Quit: 02/15/2013  . Smokeless tobacco: Never Used  . Alcohol Use: No   OB History    Gravida Para Term Preterm AB TAB SAB Ectopic Multiple Living   0 0 0 0 0 0 4     Review of Systems  Constitutional: Negative for fever.  HENT: Negative for congestion.   Eyes: Positive for blurred vision.  Respiratory: Negative for cough.   Neurological: Positive for headaches. Negative for dizziness and numbness.  All other systems reviewed and are negative.     Allergies  Sulfa antibiotics; Sulfur; and Other  Home Medications   Prior to Admission medications   Medication Sig Start Date End Date Taking? Authorizing Provider  acetaminophen (TYLENOL) 325 MG tablet Take 650 mg by mouth every 6 (six) hours as needed.   Yes Historical Provider, MD  etonogestrel (NEXPLANON) 68 MG IMPL implant 1 each by Subdermal route once. Placed 06/2012   Yes Historical Provider, MD  ibuprofen (ADVIL,MOTRIN) 800 MG tablet Take 1 tablet (800 mg total) by mouth 3 (three) times daily. 04/30/15  Yes Duane Lope, NP  Multiple Vitamins-Minerals (MULTIVITAMIN WITH MINERALS) tablet Take 1 tablet by mouth daily.   Yes Historical Provider, MD   BP 123/81 mmHg  Pulse 86  Temp(Src) 99 F (37.2 C) (Oral)  Resp 17  SpO2 99%  Physical Exam  Constitutional: She is oriented to person, place, and time. She appears well-developed and well-nourished. No distress.  HENT:  Head: Normocephalic and atraumatic.  Eyes: EOM are normal. Pupils are equal, round, and reactive to light.  There is no papilledema on funduscopic exam.  Neck: Normal range of motion. Neck supple.  Cardiovascular: Normal rate and regular rhythm.  Exam reveals no gallop and no friction rub.   No murmur heard. Pulmonary/Chest: Effort normal and breath sounds normal. No respiratory distress. She has no wheezes.  Abdominal: Soft. Bowel sounds are normal. She exhibits no distension. There is no tenderness.  Musculoskeletal: Normal range of  motion.  Neurological: She is alert and oriented to person, place, and time. No cranial nerve deficit. She exhibits normal muscle tone. Coordination normal.  Skin: Skin is warm and dry. She is not diaphoretic.  Nursing note and vitals reviewed.   ED Course  Procedures (including critical care time) Labs Review Labs Reviewed - No data to display  Imaging Review No results found. I have personally reviewed and evaluated these images and lab results as part of my medical decision-making.  MDM   Final diagnoses:  None   Neurologic exam is nonfocal and CT scan of the head is unremarkable. She is feeling better after a migraine cocktail. I see nothing emergent and doubt any serious pathology. She will be treated with Tylenol and Motrin and is to return as needed for any problems.    Geoffery Lyons, MD 05/01/15 1452

## 2015-05-01 NOTE — Discharge Instructions (Signed)
Tylenol 1000 mg rotated with ibuprofen 600 mg every 4 hours as needed for pain.  Limit your caffeine and alcohol content.  Follow-up with your primary Dr. if not improving in the next week.   General Headache Without Cause A headache is pain or discomfort felt around the head or neck area. The specific cause of a headache may not be found. There are many causes and types of headaches. A few common ones are:  Tension headaches.  Migraine headaches.  Cluster headaches.  Chronic daily headaches. HOME CARE INSTRUCTIONS  Watch your condition for any changes. Take these steps to help with your condition: Managing Pain  Take over-the-counter and prescription medicines only as told by your health care provider.  Lie down in a dark, quiet room when you have a headache.  If directed, apply ice to the head and neck area:  Put ice in a plastic bag.  Place a towel between your skin and the bag.  Leave the ice on for 20 minutes, 2-3 times per day.  Use a heating pad or hot shower to apply heat to the head and neck area as told by your health care provider.  Keep lights dim if bright lights bother you or make your headaches worse. Eating and Drinking  Eat meals on a regular schedule.  Limit alcohol use.  Decrease the amount of caffeine you drink, or stop drinking caffeine. General Instructions  Keep all follow-up visits as told by your health care provider. This is important.  Keep a headache journal to help find out what may trigger your headaches. For example, write down:  What you eat and drink.  How much sleep you get.  Any change to your diet or medicines.  Try massage or other relaxation techniques.  Limit stress.  Sit up straight, and do not tense your muscles.  Do not use tobacco products, including cigarettes, chewing tobacco, or e-cigarettes. If you need help quitting, ask your health care provider.  Exercise regularly as told by your health care  provider.  Sleep on a regular schedule. Get 7-9 hours of sleep, or the amount recommended by your health care provider. SEEK MEDICAL CARE IF:   Your symptoms are not helped by medicine.  You have a headache that is different from the usual headache.  You have nausea or you vomit.  You have a fever. SEEK IMMEDIATE MEDICAL CARE IF:   Your headache becomes severe.  You have repeated vomiting.  You have a stiff neck.  You have a loss of vision.  You have problems with speech.  You have pain in the eye or ear.  You have muscular weakness or loss of muscle control.  You lose your balance or have trouble walking.  You feel faint or pass out.  You have confusion.   This information is not intended to replace advice given to you by your health care provider. Make sure you discuss any questions you have with your health care provider.   Document Released: 02/20/2005 Document Revised: 11/11/2014 Document Reviewed: 06/15/2014 Elsevier Interactive Patient Education [employer]

## 2015-05-01 NOTE — ED Notes (Signed)
Pt admits to headache x8 days and nausea.

## 2015-06-23 ENCOUNTER — Ambulatory Visit: Payer: BLUE CROSS/BLUE SHIELD | Admitting: Certified Nurse Midwife

## 2015-07-15 ENCOUNTER — Ambulatory Visit: Payer: BLUE CROSS/BLUE SHIELD | Admitting: Obstetrics

## 2016-01-03 ENCOUNTER — Ambulatory Visit: Payer: Self-pay | Admitting: Obstetrics

## 2016-01-05 ENCOUNTER — Encounter: Payer: Self-pay | Admitting: Obstetrics and Gynecology

## 2016-01-05 ENCOUNTER — Ambulatory Visit: Payer: Self-pay | Admitting: Obstetrics and Gynecology

## 2016-01-05 DIAGNOSIS — Z202 Contact with and (suspected) exposure to infections with a predominantly sexual mode of transmission: Secondary | ICD-10-CM | POA: Insufficient documentation

## 2016-01-05 NOTE — Patient Instructions (Signed)
Sexually Transmitted Disease °A sexually transmitted disease (STD) is a disease or infection that may be passed (transmitted) from person to person, usually during sexual activity. This may happen by way of saliva, semen, blood, vaginal mucus, or urine. Common STDs include: °· Gonorrhea. °· Chlamydia. °· Syphilis. °· HIV and AIDS. °· Genital herpes. °· Hepatitis B and C. °· Trichomonas. °· Human papillomavirus (HPV). °· Pubic lice. °· Scabies. °· Mites. °· Bacterial vaginosis. °WHAT ARE CAUSES OF STDs? °An STD may be caused by bacteria, a virus, or parasites. STDs are often transmitted during sexual activity if one person is infected. However, they may also be transmitted through nonsexual means. STDs may be transmitted after:  °· Sexual intercourse with an infected person. °· Sharing sex toys with an infected person. °· Sharing needles with an infected person or using unclean piercing or tattoo needles. °· Having intimate contact with the genitals, mouth, or rectal areas of an infected person. °· Exposure to infected fluids during birth. °WHAT ARE THE SIGNS AND SYMPTOMS OF STDs? °Different STDs have different symptoms. Some people may not have any symptoms. If symptoms are present, they may include: °· Painful or bloody urination. °· Pain in the pelvis, abdomen, vagina, anus, throat, or eyes. °· A skin rash, itching, or irritation. °· Growths, ulcerations, blisters, or sores in the genital and anal areas. °· Abnormal vaginal discharge with or without bad odor. °· Penile discharge in men. °· Fever. °· Pain or bleeding during sexual intercourse. °· Swollen glands in the groin area. °· Yellow skin and eyes (jaundice). This is seen with hepatitis. °· Swollen testicles. °· Infertility. °· Sores and blisters in the mouth. °HOW ARE STDs DIAGNOSED? °To make a diagnosis, your health care provider may: °· Take a medical history. °· Perform a physical exam. °· Take a sample of any discharge to examine. °· Swab the throat,  cervix, opening to the penis, rectum, or vagina for testing. °· Test a sample of your first morning urine. °· Perform blood tests. °· Perform a Pap test, if this applies. °· Perform a colposcopy. °· Perform a laparoscopy. °HOW ARE STDs TREATED? °Treatment depends on the STD. Some STDs may be treated but not cured. °· Chlamydia, gonorrhea, trichomonas, and syphilis can be cured with antibiotic medicine. °· Genital herpes, hepatitis, and HIV can be treated, but not cured, with prescribed medicines. The medicines lessen symptoms. °· Genital warts from HPV can be treated with medicine or by freezing, burning (electrocautery), or surgery. Warts may come back. °· HPV cannot be cured with medicine or surgery. However, abnormal areas may be removed from the cervix, vagina, or vulva. °· If your diagnosis is confirmed, your recent sexual partners need treatment. This is true even if they are symptom-free or have a negative culture or evaluation. They should not have sex until their health care providers say it is okay. °· Your health care provider may test you for infection again 3 months after treatment. °HOW CAN I REDUCE MY RISK OF GETTING AN STD? °Take these steps to reduce your risk of getting an STD: °· Use latex condoms, dental dams, and water-soluble lubricants during sexual activity. Do not use petroleum jelly or oils. °· Avoid having multiple sex partners. °· Do not have sex with someone who has other sex partners °· Do not have sex with anyone you do not know or who is at high risk for an STD. °· Avoid risky sex practices that can break your skin. °· Do not have sex   if you have open sores on your mouth or skin. °· Avoid drinking too much alcohol or taking illegal drugs. Alcohol and drugs can affect your judgment and put you in a vulnerable position. °· Avoid engaging in oral and anal sex acts. °· Get vaccinated for HPV and hepatitis. If you have not received these vaccines in the past, talk to your health care  provider about whether one or both might be right for you. °· If you are at risk of being infected with HIV, it is recommended that you take a prescription medicine daily to prevent HIV infection. This is called pre-exposure prophylaxis (PrEP). You are considered at risk if: °¨ You are a man who has sex with other men (MSM). °¨ You are a heterosexual man or woman and are sexually active with more than one partner. °¨ You take drugs by injection. °¨ You are sexually active with a partner who has HIV. °· Talk with your health care provider about whether you are at high risk of being infected with HIV. If you choose to begin PrEP, you should first be tested for HIV. You should then be tested every 3 months for as long as you are taking PrEP. °WHAT SHOULD I DO IF I THINK I HAVE AN STD? °· See your health care provider. °· Tell your sexual partner(s). They should be tested and treated for any STDs. °· Do not have sex until your health care provider says it is okay. °WHEN SHOULD I GET IMMEDIATE MEDICAL CARE? °Contact your health care provider right away if:  °· You have severe abdominal pain. °· You are a man and notice swelling or pain in your testicles. °· You are a woman and notice swelling or pain in your vagina. °  °This information is not intended to replace advice given to you by your health care provider. Make sure you discuss any questions you have with your health care provider. °  °Document Released: 05/13/2002 Document Revised: 03/13/2014 Document Reviewed: 09/10/2012 °Elsevier Interactive Patient Education ©2016 Else[employer] Inc[employer] ° °

## 2016-01-05 NOTE — Progress Notes (Addendum)
Patient is in the office for STD testing.   As noted. Pt desires STD testing. She also needs to have Nexplanon remove. Was due for removal this past April. Pt states she had negative HIV, GC/C a month ago and does not need these tests. She is most concerned about HSV. She has some vaginal/labial burning at times but has never had any visible lesions. She is currently not sexual active.  PE AF VSS Obese female in NAD Lungs clear Heart RRR Abd sot + BS obese Pelvic Nl EGBUS, white vaginal d/c, cervix no lesions uterus small no adnexal masses, limited by pt habitus  A/P STD testing  STD labs and NuSwab ordered today. Pt to schedule appt for removal of Nexplanon.

## 2016-01-06 LAB — HSV(HERPES SIMPLEX VRS) I + II AB-IGG
HSV 1 GLYCOPROTEIN G AB, IGG: 40.6 {index} — AB (ref 0.00–0.90)
HSV 2 Glycoprotein G Ab, IgG: <0.91 index (ref 0.00–0.90)

## 2016-01-06 LAB — HEPATITIS B SURFACE ANTIGEN: HEP B S AG: NEGATIVE

## 2016-01-06 LAB — RPR: RPR: NONREACTIVE

## 2016-01-08 LAB — NUSWAB VG, CANDIDA 6SP
Atopobium vaginae: HIGH Score — AB
BVAB 2: HIGH {score} — AB
CANDIDA KRUSEI, NAA: NEGATIVE
CANDIDA TROPICALIS, NAA: NEGATIVE
Candida albicans, NAA: NEGATIVE
Candida glabrata, NAA: NEGATIVE
Candida lusitaniae, NAA: NEGATIVE
Candida parapsilosis, NAA: NEGATIVE
MEGASPHAERA 1: HIGH {score} — AB
TRICH VAG BY NAA: NEGATIVE

## 2016-01-12 ENCOUNTER — Other Ambulatory Visit: Payer: Self-pay | Admitting: *Deleted

## 2016-01-12 DIAGNOSIS — N760 Acute vaginitis: Principal | ICD-10-CM

## 2016-01-12 DIAGNOSIS — B9689 Other specified bacterial agents as the cause of diseases classified elsewhere: Secondary | ICD-10-CM

## 2016-01-12 MED ORDER — METRONIDAZOLE 500 MG PO TABS: 500.0000 mg | ORAL_TABLET | Freq: Two times a day (BID) | ORAL | 0 refills | Status: DC

## 2016-01-12 NOTE — Progress Notes (Signed)
Pt made aware of lab results. Flagyl 500mg  sent to pharmacy.

## 2016-01-18 ENCOUNTER — Ambulatory Visit: Payer: Self-pay | Admitting: Obstetrics

## 2016-02-02 ENCOUNTER — Ambulatory Visit: Payer: Medicaid Other | Admitting: Obstetrics and Gynecology

## 2016-02-23 ENCOUNTER — Encounter (HOSPITAL_COMMUNITY): Payer: Self-pay | Admitting: Emergency Medicine

## 2016-02-23 ENCOUNTER — Emergency Department (HOSPITAL_COMMUNITY): Payer: Medicaid Other

## 2016-02-23 ENCOUNTER — Emergency Department (HOSPITAL_COMMUNITY)
Admission: EM | Admit: 2016-02-23 | Discharge: 2016-02-23 | Disposition: A | Discharge status: Home / Self Care | Payer: Medicaid Other | Attending: Emergency Medicine | Admitting: Emergency Medicine

## 2016-02-23 DIAGNOSIS — R0789 Other chest pain: Secondary | ICD-10-CM | POA: Insufficient documentation

## 2016-02-23 DIAGNOSIS — F17210 Nicotine dependence, cigarettes, uncomplicated: Secondary | ICD-10-CM | POA: Insufficient documentation

## 2016-02-23 LAB — CBC
HCT: 42.7 % (ref 36.0–46.0)
Hemoglobin: 14.4 g/dL (ref 12.0–15.0)
MCH: 28.7 pg (ref 26.0–34.0)
MCHC: 33.7 g/dL (ref 30.0–36.0)
MCV: 85.2 fL (ref 78.0–100.0)
PLATELETS: 274 10*3/uL (ref 150–400)
RBC: 5.01 MIL/uL (ref 3.87–5.11)
RDW: 13.6 % (ref 11.5–15.5)
WBC: 10.3 10*3/uL (ref 4.0–10.5)

## 2016-02-23 LAB — I-STAT TROPONIN, ED: Troponin i, poc: 0.01 ng/mL (ref 0.00–0.08)

## 2016-02-23 LAB — BASIC METABOLIC PANEL
Anion gap: 9 (ref 5–15)
BUN: 8 mg/dL (ref 6–20)
CHLORIDE: 102 mmol/L (ref 101–111)
CO2: 26 mmol/L (ref 22–32)
CREATININE: 0.66 mg/dL (ref 0.44–1.00)
Calcium: 9.1 mg/dL (ref 8.9–10.3)
GFR calc Af Amer: >60 mL/min (ref >60)
GFR calc non Af Amer: >60 mL/min (ref >60)
GLUCOSE: 95 mg/dL (ref 65–99)
Potassium: 3.4 mmol/L — ABNORMAL LOW (ref 3.5–5.1)
SODIUM: 137 mmol/L (ref 135–145)

## 2016-02-23 MED ORDER — ONDANSETRON 4 MG PO TBDP
4.0000 mg | ORAL_TABLET | Freq: Once | ORAL | Status: AC
  Administered 2016-02-23: 4 mg via ORAL
  Filled 2016-02-23: qty 1

## 2016-02-23 MED ORDER — GI COCKTAIL ~~LOC~~
30.0000 mL | Freq: Once | ORAL | Status: AC
  Administered 2016-02-23: 30 mL via ORAL
  Filled 2016-02-23: qty 30

## 2016-02-23 NOTE — ED Provider Notes (Signed)
[facility]-EMERGENCY DEPT Provider Note   CSN: [id number] Arrival date & time: 02/23/16  1906     History   Chief Complaint Chief Complaint  Patient presents with  . Chest Pain  . Shoulder Pain    HPI Bianca Cantrell is a 30 y.o. female.  30 yo F with a chief complaint of epigastric abdominal pain. Going on for the past couple days. Was when she lies back flat. Also worse when she raises her arms above her head. Having some nausea denies vomiting. Denies fevers or chills. Denies injury. Eating does make this mildly worse.   The history is provided by the patient.  Chest Pain   This is a new problem. The current episode started 2 days ago. The problem occurs constantly. The problem has not changed since onset.Associated with: lying flat, lifting her arms up. The pain is present in the substernal region and epigastric region. The pain is at a severity of 8/10. The pain is moderate. The quality of the pain is described as burning. The pain does not radiate. Associated symptoms include nausea. Pertinent negatives include no dizziness, no fever, no headaches, no palpitations, no shortness of breath and no vomiting. She has tried nothing for the symptoms. The treatment provided no relief.  Pertinent negatives for past medical history include no MI and no PE.  Shoulder Pain      Past Medical History:  Diagnosis Date  . Abnormal Pap smear    HPV  . Anemia   . Chlamydia   . Eczema   . Gallstones   . Infection    urinary tract infection  . No pertinent past medical history   . Obesity   . Sickle cell trait [facility](HCC)     Patient Active Problem List   Diagnosis Date Noted  . Exposure to STD 01/05/2016  . GERD without esophagitis 02/17/2013  . Condyloma acuminatum 11/19/2012  . Overweight and obesity(278.0) 05/30/2012  . General counseling for initiation of other contraceptive measures 05/30/2012    Past Surgical History:  Procedure Laterality Date  . APPENDECTOMY  2010  .  CHOLECYSTECTOMY  2011    OB History    Gravida Para Term Preterm AB Living   4 4 4  0 0 4   SAB TAB Ectopic Multiple Live Births   0 0 0 0 4       Home Medications    Prior to Admission medications   Medication Sig Start Date End Date Taking? Authorizing Provider  acetaminophen (TYLENOL) 325 MG tablet Take 650 mg by mouth every 6 (six) hours as needed for moderate pain.    Yes Historical Provider, MD  Multiple Vitamins-Minerals (MULTIVITAMIN WITH MINERALS) tablet Take 1 tablet by mouth daily.   Yes Historical Provider, MD  etonogestrel (NEXPLANON) 68 MG IMPL implant 1 each by Subdermal route once. Placed 06/2012    Historical Provider, MD  ibuprofen (ADVIL,MOTRIN) 800 MG tablet Take 1 tablet (800 mg total) by mouth 3 (three) times daily. Patient not taking: Reported on 01/05/2016 04/30/15   Duane LopeJennifer I Rasch, NP  metroNIDAZOLE (FLAGYL) 500 MG tablet Take 1 tablet (500 mg total) by mouth 2 (two) times daily. Patient not taking: Reported on 02/23/2016 01/12/16   Hermina StaggersMichael L Ervin, MD    Family History Family History  Problem Relation Age of Onset  . Hypertension Mother   . Heart disease Mother   . Hypertension Maternal Grandmother   . Heart disease Maternal Grandmother   . Cancer Maternal Aunt  leukemia  . Cancer Maternal Uncle     pancreatic  . Other Neg Hx     Social History Social History  Substance Use Topics  . Smoking status: Current Some Day Smoker    Packs/day: 0.25    Years: 1.00    Types: Cigarettes    Last attempt to quit: 02/15/2013  . Smokeless tobacco: Never Used  . Alcohol use Yes     Allergies   Sulfa antibiotics; Sulfur; and Other   Review of Systems Review of Systems  Constitutional: Negative for chills and fever.  HENT: Negative for congestion and rhinorrhea.   Eyes: Negative for redness and visual disturbance.  Respiratory: Negative for shortness of breath and wheezing.   Cardiovascular: Positive for chest pain. Negative for palpitations.    Gastrointestinal: Positive for nausea. Negative for vomiting.  Genitourinary: Negative for dysuria and urgency.  Musculoskeletal: Negative for arthralgias and myalgias.  Skin: Negative for pallor and wound.  Neurological: Negative for dizziness and headaches.     Physical Exam Updated Vital Signs BP 126/83 (BP Location: Left Arm) Comment (BP Location): Lower  Pulse 86   Temp 98.4 F (36.9 C) (Oral)   Resp 18   Ht 4\' 10"  (1.473 m)   Wt 283 lb (128.4 kg)   SpO2 95%   BMI 59.15 kg/m   Physical Exam  Constitutional: She is oriented to person, place, and time. She appears well-developed and well-nourished. No distress.  HENT:  Head: Normocephalic and atraumatic.  Eyes: EOM are normal. Pupils are equal, round, and reactive to light.  Neck: Normal range of motion. Neck supple.  Cardiovascular: Normal rate and regular rhythm.  Exam reveals no gallop and no friction rub.   No murmur heard. Pulmonary/Chest: Effort normal. She has no wheezes. She has no rales.  Abdominal: Soft. She exhibits no distension and no mass. There is tenderness (mild epigastric, recreates pain). There is no rebound and no guarding.  Musculoskeletal: She exhibits no edema or tenderness.  Neurological: She is alert and oriented to person, place, and time.  Skin: Skin is warm and dry. She is not diaphoretic.  Psychiatric: She has a normal mood and affect. Her behavior is normal.  Nursing note and vitals reviewed.    ED Treatments / Results  Labs (all labs ordered are listed, but only abnormal results are displayed) Labs Reviewed  BASIC METABOLIC PANEL - Abnormal; Notable for the following:       Result Value   Potassium 3.4 (*)    All other components within normal limits  CBC  I-STAT TROPOININ, ED    EKG  EKG Interpretation  Date/Time:  Wednesday February 23 2016 19:15:08 EST Ventricular Rate:  117 PR Interval:    QRS Duration: 80 QT Interval:  333 QTC Calculation: 463 R Axis:   49 Text  Interpretation:  Sinus tachycardia Borderline T wave abnormalities No significant change since last tracing Confirmed by Creedon Danielski MD, DANIEL [phone]([id number]) on 02/23/2016 9:16:34 PM       Radiology Dg Chest 2 View  Result Date: 02/23/2016 CLINICAL DATA:  Chest pain and left shoulder pain for 2 days EXAM: CHEST  2 VIEW COMPARISON:  08/09/2014 FINDINGS: The heart size and mediastinal contours are within normal limits. Both lungs are clear. The visualized skeletal structures are unremarkable. IMPRESSION: No active cardiopulmonary disease. Electronically Signed   By: Jasmine PangKim  Fujinaga M.D.   On: 02/23/2016 19:31    Procedures Procedures (including critical care time)  Medications Ordered in ED Medications  ondansetron (  ZOFRAN-ODT) disintegrating tablet 4 mg (4 mg Oral Given 02/23/16 2200)  gi cocktail (Maalox,Lidocaine,Donnatal) (30 mLs Oral Given 02/23/16 2200)     Initial Impression / Assessment and Plan / ED Course  I have reviewed the triage vital signs and the nursing notes.  Pertinent labs & imaging results that were available during my care of the patient were reviewed by me and considered in my medical decision making (see chart for details).  Clinical Course     30 yo F With a chief complaint of atypical chest pain. I suspect this is likely reflux disease based on her history. Pain is reproduced with palpation to her epigastrium. Will start the patient on Zantac. EKG and chest x-ray were unremarkable. PERC negative.   11:59 PM:  I have discussed the diagnosis/risks/treatment options with the patient and family and believe the pt to be eligible for discharge home to follow-up with PCP. We also discussed returning to the ED immediately if new or worsening sx occur. We discussed the sx which are most concerning (e.g., sudden worsening pain, fever, inability to tolerate by mouth) that necessitate immediate return. Medications administered to the patient during their visit and any new prescriptions  provided to the patient are listed below.  Medications given during this visit Medications  ondansetron (ZOFRAN-ODT) disintegrating tablet 4 mg (4 mg Oral Given 02/23/16 2200)  gi cocktail (Maalox,Lidocaine,Donnatal) (30 mLs Oral Given 02/23/16 2200)     The patient appears reasonably screen and/or stabilized for discharge and I doubt any other medical condition or other [facility] requiring further screening, evaluation, or treatment in the ED at this time prior to discharge.    Final Clinical Impressions(s) / ED Diagnoses   Final diagnoses:  Atypical chest pain    New Prescriptions Discharge Medication List as of 02/23/2016  9:53 PM       Melene Plan, DO 02/23/16 2359

## 2016-02-23 NOTE — ED Triage Notes (Signed)
Patient reports chest pain and left shoulder pain x2 days. Patient reports dizziness, nausea, vomiting, and back pain.

## 2016-02-23 NOTE — Discharge Instructions (Signed)
Try zantac 150mg twice a day.  ° ° °

## 2016-03-01 ENCOUNTER — Ambulatory Visit: Payer: Medicaid Other | Admitting: Obstetrics and Gynecology

## 2016-03-08 ENCOUNTER — Ambulatory Visit: Payer: Medicaid Other | Admitting: Obstetrics

## 2016-03-21 ENCOUNTER — Ambulatory Visit: Payer: Medicaid Other | Admitting: Obstetrics & Gynecology

## 2016-04-20 ENCOUNTER — Ambulatory Visit: Payer: Medicaid Other | Admitting: Obstetrics & Gynecology

## 2016-08-12 ENCOUNTER — Encounter (HOSPITAL_COMMUNITY): Payer: Self-pay | Admitting: Emergency Medicine

## 2016-08-12 ENCOUNTER — Emergency Department (HOSPITAL_COMMUNITY)
Admission: EM | Admit: 2016-08-12 | Discharge: 2016-08-12 | Disposition: A | Discharge status: Home / Self Care | Payer: Medicaid Other | Attending: Emergency Medicine | Admitting: Emergency Medicine

## 2016-08-12 ENCOUNTER — Ambulatory Visit (HOSPITAL_COMMUNITY)
Admission: EM | Admit: 2016-08-12 | Discharge: 2016-08-12 | Disposition: A | Discharge status: Home / Self Care | Payer: Medicaid Other | Attending: Internal Medicine | Admitting: Internal Medicine

## 2016-08-12 DIAGNOSIS — H9201 Otalgia, right ear: Secondary | ICD-10-CM

## 2016-08-12 DIAGNOSIS — F17210 Nicotine dependence, cigarettes, uncomplicated: Secondary | ICD-10-CM | POA: Insufficient documentation

## 2016-08-12 DIAGNOSIS — R51 Headache: Secondary | ICD-10-CM

## 2016-08-12 DIAGNOSIS — H9311 Tinnitus, right ear: Secondary | ICD-10-CM

## 2016-08-12 MED ORDER — NAPROXEN 375 MG PO TABS: 375.0000 mg | ORAL_TABLET | Freq: Two times a day (BID) | ORAL | 0 refills | Status: DC

## 2016-08-12 MED ORDER — PREDNISONE 20 MG PO TABS: ORAL_TABLET | ORAL | 0 refills | Status: DC

## 2016-08-12 MED ORDER — FLUTICASONE PROPIONATE 50 MCG/ACT NA SUSP: 2.0000 | Freq: Every day | NASAL | 2 refills | Status: DC

## 2016-08-12 NOTE — ED Triage Notes (Signed)
Pt states she is having 9/10 right side ear pain and feeling a knot on the back of the ear, denies any fever or chills. Pt was seen today on UC and sent home. Pt states she needs a second opinion about her ear because her ear is more painful.

## 2016-08-12 NOTE — ED Provider Notes (Signed)
Emergency Department Provider Note  By signing my name below, I, Deland PrettySherilynn Knight, attest that this documentation has been prepared under the direction and in the presence of Teria Khachatryan, Arlyss RepressJoshua G, MD. Electronically Signed: Deland PrettySherilynn Knight, ED Scribe. 08/12/16. 9:05 PM.  I have reviewed the triage vital signs and the nursing notes.   HISTORY  Chief Complaint Otalgia  HPI Comments: Bianca Cantrell is a 31 y.o. female who presents to the Emergency Department complaining of a moderately painful gradually worsening knot behind her left ear with associated headache that she noticed today. The pt also reports of a "whooshing" sound that always accompanies her headaches and began a year ago. The pt indicates that she takes migraine medication to treat her symptoms with full relief.  She states that her mother has a FHx of migraines, but not of hemorrhagic stroke.The pt denies rhinorrhea, ear drainage, rhinorrhea and sore throat.   Past Medical History:  Diagnosis Date  . Abnormal Pap smear    HPV  . Anemia   . Chlamydia   . Eczema   . Gallstones   . Infection    urinary tract infection  . No pertinent past medical history   . Obesity   . Sickle cell trait [facility](HCC)     Patient Active Problem List   Diagnosis Date Noted  . Exposure to STD 01/05/2016  . GERD without esophagitis 02/17/2013  . Condyloma acuminatum 11/19/2012  . Overweight and obesity(278.0) 05/30/2012  . General counseling for initiation of other contraceptive measures 05/30/2012    Past Surgical History:  Procedure Laterality Date  . APPENDECTOMY  2010  . CHOLECYSTECTOMY  2011    Current Outpatient Rx  . Order #: [id number] Class: Historical Med  . Order #: [id number] Class: Historical Med  . Order #: [id number] Class: Normal  . Order #: [id number] Class: Normal  . Order #: [id number] Class: Normal  . Order #: [id number] Class: Historical Med  . Order #: [id number] Class: Normal  . Order #: [id number] Class: Normal     Allergies Sulfa antibiotics; Sulfur; and Other  Family History  Problem Relation Age of Onset  . Hypertension Mother   . Heart disease Mother   . Hypertension Maternal Grandmother   . Heart disease Maternal Grandmother   . Cancer Maternal Aunt        leukemia  . Cancer Maternal Uncle        pancreatic  . Other Neg Hx     Social History Social History  Substance Use Topics  . Smoking status: Current Some Day Smoker    Packs/day: 0.25    Years: 1.00    Types: Cigarettes    Last attempt to quit: 02/15/2013  . Smokeless tobacco: Never Used  . Alcohol use Yes    Review of Systems  Constitutional: No fever/chills. Eyes: No visual changes. ENT: No sore throat. Positive for ear pain. Negative for ear discharge. Denies rhinorrhea. Denies sore throat. Cardiovascular: Denies chest pain. Respiratory: Denies shortness of breath. Gastrointestinal: No abdominal pain.  No nausea, no vomiting.  No diarrhea.  No constipation. Genitourinary: Negative for dysuria. Musculoskeletal: Negative for back pain. Skin: Negative for rash. Neurological: Positive for headaches. Negative focal weakness or numbness.  10-point ROS otherwise negative.  ____________________________________________   PHYSICAL EXAM:  VITAL SIGNS: ED Triage Vitals  Enc Vitals Group     BP 08/12/16 2016 (!) 150/89     Pulse Rate 08/12/16 2016 (!) 101     Resp 08/12/16 2016 19     Temp 08/12/16 2016  98.3 F (36.8 C)     Temp Source 08/12/16 2016 Oral     SpO2 08/12/16 2016 100 %     Weight 08/12/16 2021 225 lb (102.1 kg)     Height 08/12/16 2021 4\' 10"  (1.473 m)   Constitutional: Alert and oriented. Well appearing and in no acute distress. Eyes: Conjunctivae are normal.  Head: Atraumatic. Ears:  Healthy appearing ear canals and TMs bilaterally. Palpable 0.25 cm posterior auricular lymph node palpated on the right. No mastoid, bony tenderness. No overlying erythema. Nose: No  congestion/rhinnorhea. Mouth/Throat: Mucous membranes are moist.  Oropharynx non-erythematous. Neck: No stridor.   Cardiovascular: Normal rate, regular rhythm. Good peripheral circulation. Grossly normal heart sounds.   Respiratory: Normal respiratory effort.  No retractions. Lungs CTAB. Gastrointestinal: No distention.  Musculoskeletal: No lower extremity tenderness nor edema. No gross deformities of extremities. Neurologic:  Normal speech and language. No gross focal neurologic deficits are appreciated.  Skin:  Skin is warm, dry and intact. No rash noted. Psychiatric: Mood and affect are normal. Speech and behavior are normal.  ____________________________________________  DIAGNOSTIC STUDIES: Oxygen Saturation is 100% on RA, normal by my interpretation.   COORDINATION OF CARE: 8:54 PM-Discussed next steps with pt. Pt verbalized understanding and is agreeable with the plan.    ____________________________________________   PROCEDURES  Procedure(s) performed:   Procedures  None ____________________________________________   INITIAL IMPRESSION / ASSESSMENT AND PLAN / ED COURSE  Pertinent labs & imaging results that were available during my care of the patient were reviewed by me and considered in my medical decision making (see chart for details).  Patient presents to the emergency department with small mass behind the right ear and chronic, intermittent "wooshing" sound with HA. The auditory phenomenon seems to be only with HA. Question if this is a headache aura. No sudden onset, maximal intensity headache to suggest sentinel bleed from aneurysm. No headache currently. No neck discomfort. The patient has a palpable posterior auricular lymph node behind the right ear likely reactive from a mild URI. No evidence of mastoiditis, otitis media, otitis externa. Discussed my impression with the patient and plan for PCP follow-up. Also provided contact information for neurology given  her chronic, intermittent headaches.   At this time, I do not feel there is any life-threatening condition present. I have reviewed and discussed all results (EKG, imaging, lab, urine as appropriate), exam findings with patient. I have reviewed nursing notes and appropriate previous records.  I feel the patient is safe to be discharged home without further emergent workup. Discussed usual and customary return precautions. Patient and family (if present) verbalize understanding and are comfortable with this plan.  Patient will follow-up with their primary care provider. If they do not have a primary care provider, information for follow-up has been provided to them. All questions have been answered.  ____________________________________________  FINAL CLINICAL IMPRESSION(S) / ED DIAGNOSES  Final diagnoses:  Right ear pain     MEDICATIONS GIVEN DURING THIS VISIT:  Medications - No data to display   NEW OUTPATIENT MEDICATIONS STARTED DURING THIS VISIT:  None   I personally performed the services described in this documentation, which was scribed in my presence. The recorded information has been reviewed and is accurate.    Note:  This document was prepared using Dragon voice recognition software and may include unintentional dictation errors.  Alona Bene, MD Emergency Medicine    Tymesha Ditmore, Arlyss Repress, MD 08/13/16 [phone]

## 2016-08-12 NOTE — ED Provider Notes (Signed)
CSN: [id number]     Arrival date & time 08/12/16  1839 History   First MD Initiated Contact with Patient 08/12/16 1926     Chief Complaint  Patient presents with  . Otalgia   (Consider location/radiation/quality/duration/timing/severity/associated sxs/prior Treatment) HPI  Bianca Cantrell is a 31 y.o. female presenting to UC with c/o chronic Right ear pain and tinnitus described as a "whooshing" sound for about 1 year.  Symptoms worsened about 2-3 days ago after a friend noticed a knot behind her Right ear.  Associated mild intermittent headaches on Right side.  No relief with OTC ibuprofen.  Denies recent cough, congestion, fever, chills, n/v/d. Denies sore throat.    Past Medical History:  Diagnosis Date  . Abnormal Pap smear    HPV  . Anemia   . Chlamydia   . Eczema   . Gallstones   . Infection    urinary tract infection  . No pertinent past medical history   . Obesity   . Sickle cell trait [facility](HCC)    Past Surgical History:  Procedure Laterality Date  . APPENDECTOMY  2010  . CHOLECYSTECTOMY  2011   Family History  Problem Relation Age of Onset  . Hypertension Mother   . Heart disease Mother   . Hypertension Maternal Grandmother   . Heart disease Maternal Grandmother   . Cancer Maternal Aunt        leukemia  . Cancer Maternal Uncle        pancreatic  . Other Neg Hx    Social History  Substance Use Topics  . Smoking status: Current Some Day Smoker    Packs/day: 0.25    Years: 1.00    Types: Cigarettes    Last attempt to quit: 02/15/2013  . Smokeless tobacco: Never Used  . Alcohol use Yes   OB History    Gravida Para Term Preterm AB Living   4 4 4  0 0 4   SAB TAB Ectopic Multiple Live Births   0 0 0 0 4     Review of Systems  Constitutional: Negative for chills and fever.  HENT: Positive for ear pain (Right) and tinnitus. Negative for congestion.   Respiratory: Negative for cough and shortness of breath.   Gastrointestinal: Negative for abdominal pain,  diarrhea, nausea and vomiting.  Skin: Negative for color change and wound.  Neurological: Positive for headaches. Negative for dizziness and light-headedness.    Allergies  Sulfa antibiotics; Sulfur; and Other  Home Medications   Prior to Admission medications   Medication Sig Start Date End Date Taking? Authorizing Provider  acetaminophen (TYLENOL) 325 MG tablet Take 650 mg by mouth every 6 (six) hours as needed for moderate pain.     [provider]  etonogestrel (NEXPLANON) 68 MG IMPL implant 1 each by Subdermal route once. Placed 06/2012    [provider]  fluticasone (FLONASE) 50 MCG/ACT nasal spray Place 2 sprays into both nostrils daily. 08/12/16   Junius Finner'Malley, Caron Tardif, PA-C  ibuprofen (ADVIL,MOTRIN) 800 MG tablet Take 1 tablet (800 mg total) by mouth 3 (three) times daily. Patient not taking: Reported on 01/05/2016 04/30/15   Rasch, Victorino DikeJennifer I, NP  metroNIDAZOLE (FLAGYL) 500 MG tablet Take 1 tablet (500 mg total) by mouth 2 (two) times daily. Patient not taking: Reported on 02/23/2016 01/12/16   Hermina StaggersErvin, Michael L, MD  Multiple Vitamins-Minerals (MULTIVITAMIN WITH MINERALS) tablet Take 1 tablet by mouth daily.    [provider]  naproxen (NAPROSYN) 375 MG tablet Take  1 tablet (375 mg total) by mouth 2 (two) times daily. 08/12/16   Junius Finner, PA-C  predniSONE (DELTASONE) 20 MG tablet 3 tabs po day one, then 2 po daily x 4 days 08/12/16   Junius Finner, PA-C   Meds Ordered and Administered this Visit  Medications - No data to display  BP (!) 115/92 (BP Location: Right Arm)   Pulse 100   Temp 98.5 F (36.9 C) (Oral)   Resp 20   SpO2 99%  No data found.   Physical Exam  Constitutional: She is oriented to person, place, and time. She appears well-developed and well-nourished. No distress.  HENT:  Head: Normocephalic and atraumatic.  Right Ear: Tympanic membrane and external ear normal. No drainage, swelling or tenderness. Tympanic membrane is not  erythematous and not bulging. No middle ear effusion.  Left Ear: Tympanic membrane and external ear normal. No drainage, swelling or tenderness. Tympanic membrane is not erythematous and not bulging.  No middle ear effusion.  Eyes: EOM are normal.  Neck: Normal range of motion. Neck supple.  No palpable enlarged lymph nodes on Right side compared to Left. Initially moderate tenderness behind Right ear, however, tenderness resolved after re-exam comparing Left and Right side.   Cardiovascular: Normal rate and regular rhythm.   Pulmonary/Chest: Effort normal and breath sounds normal. No stridor. No respiratory distress. She has no wheezes.  Musculoskeletal: Normal range of motion.  Lymphadenopathy:    She has no cervical adenopathy.  Neurological: She is alert and oriented to person, place, and time.  Skin: Skin is warm and dry. Capillary refill takes less than 2 seconds. No rash noted. She is not diaphoretic. No erythema.  Psychiatric: She has a normal mood and affect. Her behavior is normal.  Nursing note and vitals reviewed.   Urgent Care Course     Procedures (including critical care time)  Labs Review Labs Reviewed - No data to display  Imaging Review No results found.  MDM   1. Otalgia of right ear   2. Tinnitus of right ear    Pt c/o chronic pain and tinnitus in Right ear.   Worsened over the last 2 days. Pt does not have a PCP but plans to get one July 1st when her health insurance kicks in.    No evidence of cellulitis behind Right ear where pt notes there is a knot. Normal internal and external ear exam.  Encouraged to try Prednisone, Flonase, and Naproxen until f/u with PCP and/or ENT     Junius Finner, PA-C 08/12/16 1943

## 2016-08-12 NOTE — Discharge Instructions (Signed)
°  No Primary Care Doctor: °Call [facility] at  [phone] - they can help you locate a primary care doctor that  accepts your insurance, provides certain services, etc. °Physician Referral Service- [phone] ° ° ° °

## 2016-08-12 NOTE — ED Triage Notes (Signed)
Pt c/o right ear pain onset 1 year +++ associated w/knot behind ear, HA  Denies fevers, chills  A&O x4... NAD... Ambulatory

## 2016-08-12 NOTE — ED Notes (Signed)
[facility]... Does not feel like she has been properly dx here today  Notified Vangie BickerErin O, PA.

## 2016-08-12 NOTE — Discharge Instructions (Signed)
You were seen in the ED today with ear pain. There is no evidence of infection. There appears to be a swollen lymph node. This will resolve in a matter of days to weeks. It is not dangerous. Follow up with your PCP and call the Neurology group listed with your intermittent headache symptoms to discuss prevention medication.

## 2017-03-20 ENCOUNTER — Ambulatory Visit: Payer: Self-pay | Admitting: Obstetrics

## 2017-04-01 ENCOUNTER — Emergency Department (HOSPITAL_COMMUNITY): Payer: Self-pay

## 2017-04-01 ENCOUNTER — Encounter (HOSPITAL_COMMUNITY): Payer: Self-pay | Admitting: Emergency Medicine

## 2017-04-01 ENCOUNTER — Emergency Department (HOSPITAL_COMMUNITY)
Admission: EM | Admit: 2017-04-01 | Discharge: 2017-04-01 | Disposition: A | Discharge status: Home / Self Care | Payer: Self-pay | Attending: Emergency Medicine | Admitting: Emergency Medicine

## 2017-04-01 DIAGNOSIS — J069 Acute upper respiratory infection, unspecified: Secondary | ICD-10-CM | POA: Insufficient documentation

## 2017-04-01 DIAGNOSIS — J029 Acute pharyngitis, unspecified: Secondary | ICD-10-CM

## 2017-04-01 DIAGNOSIS — B9789 Other viral agents as the cause of diseases classified elsewhere: Secondary | ICD-10-CM | POA: Insufficient documentation

## 2017-04-01 DIAGNOSIS — F17210 Nicotine dependence, cigarettes, uncomplicated: Secondary | ICD-10-CM | POA: Insufficient documentation

## 2017-04-01 DIAGNOSIS — Z79899 Other long term (current) drug therapy: Secondary | ICD-10-CM | POA: Insufficient documentation

## 2017-04-01 LAB — RAPID STREP SCREEN (MED CTR MEBANE ONLY): STREPTOCOCCUS, GROUP A SCREEN (DIRECT): NEGATIVE

## 2017-04-01 MED ORDER — AMOXICILLIN 500 MG PO CAPS: 500.0000 mg | ORAL_CAPSULE | Freq: Two times a day (BID) | ORAL | 0 refills | Status: AC

## 2017-04-01 MED ORDER — BENZONATATE 100 MG PO CAPS: 100.0000 mg | ORAL_CAPSULE | Freq: Three times a day (TID) | ORAL | 0 refills | Status: DC

## 2017-04-01 NOTE — ED Provider Notes (Signed)
[facility]-EMERGENCY DEPT Provider Note   CSN: [id number] Arrival date & time: 04/01/17  1149     History   Chief Complaint Chief Complaint  Patient presents with  . Cough  . Sore Throat    HPI Bianca Cantrell is a 32 y.o. female with no pertinent past medical history presenting with 2 weeks of upper respiratory infection symptoms including cough and congestion which eventually got better with the cough persisting.  Reports that she feels like she got sick again and has been having a few days of sore throat irritation and continues to cough.  Has tried Mucinex and TheraFlu with modest relief.  She reports history of known ill contacts with everyone at home sick.  States that she now has a Radio broadcast assistantcoworker that has the same thing that she does.  Denies any difficulty breathing, swallowing, no fever, chills, nausea, vomiting or other symptoms.  HPI  Past Medical History:  Diagnosis Date  . Abnormal Pap smear    HPV  . Anemia   . Chlamydia   . Eczema   . Gallstones   . Infection    urinary tract infection  . No pertinent past medical history   . Obesity   . Sickle cell trait [facility](HCC)     Patient Active Problem List   Diagnosis Date Noted  . Exposure to STD 01/05/2016  . GERD without esophagitis 02/17/2013  . Condyloma acuminatum 11/19/2012  . Overweight and obesity(278.0) 05/30/2012  . General counseling for initiation of other contraceptive measures 05/30/2012    Past Surgical History:  Procedure Laterality Date  . APPENDECTOMY  2010  . CHOLECYSTECTOMY  2011    OB History    Gravida Para Term Preterm AB Living   4 4 4  0 0 4   SAB TAB Ectopic Multiple Live Births   0 0 0 0 4       Home Medications    Prior to Admission medications   Medication Sig Start Date End Date Taking? Authorizing Provider  acetaminophen (TYLENOL) 325 MG tablet Take 650 mg by mouth every 6 (six) hours as needed for moderate pain.     [provider]  amoxicillin  (AMOXIL) 500 MG capsule Take 1 capsule (500 mg total) by mouth 2 (two) times daily for 7 days. 04/01/17 04/08/17  Mathews RobinsonsMitchell, Sole Lengacher B, PA-C  benzonatate (TESSALON) 100 MG capsule Take 1 capsule (100 mg total) by mouth every 8 (eight) hours. 04/01/17   Georgiana ShoreMitchell, Kamryn Gauthier B, PA-C  etonogestrel (NEXPLANON) 68 MG IMPL implant 1 each by Subdermal route once. Placed 06/2012    [provider]  fluticasone (FLONASE) 50 MCG/ACT nasal spray Place 2 sprays into both nostrils daily. 08/12/16   Lurene ShadowPhelps, Erin O, PA-C  ibuprofen (ADVIL,MOTRIN) 800 MG tablet Take 1 tablet (800 mg total) by mouth 3 (three) times daily. Patient not taking: Reported on 01/05/2016 04/30/15   Rasch, Victorino DikeJennifer I, NP  metroNIDAZOLE (FLAGYL) 500 MG tablet Take 1 tablet (500 mg total) by mouth 2 (two) times daily. Patient not taking: Reported on 02/23/2016 01/12/16   Hermina StaggersErvin, Michael L, MD  Multiple Vitamins-Minerals (MULTIVITAMIN WITH MINERALS) tablet Take 1 tablet by mouth daily.    [provider]  naproxen (NAPROSYN) 375 MG tablet Take 1 tablet (375 mg total) by mouth 2 (two) times daily. 08/12/16   Lurene ShadowPhelps, Erin O, PA-C  predniSONE (DELTASONE) 20 MG tablet 3 tabs po day one, then 2 po daily x 4 days 08/12/16   Lurene ShadowPhelps, Erin O,  PA-C  enoxaparin (LOVENOX) 150 MG/ML injection Inject 0.73 mLs (110 mg total) into the skin every 12 (twelve) hours. Patient not taking: Reported on 08/09/2014 07/08/14 11/15/14  Harle Battiest, NP  loratadine (CLARITIN) 10 MG tablet Take 1 tablet (10 mg total) by mouth daily. Patient not taking: Reported on 11/15/2014 08/09/14 11/15/14  Emilia Beck, PA-C    Family History Family History  Problem Relation Age of Onset  . Hypertension Mother   . Heart disease Mother   . Hypertension Maternal Grandmother   . Heart disease Maternal Grandmother   . Cancer Maternal Aunt        leukemia  . Cancer Maternal Uncle        pancreatic  . Other Neg Hx     Social History Social History   Tobacco Use  .  Smoking status: Current Some Day Smoker    Packs/day: 0.25    Years: 1.00    Pack years: 0.25    Types: Cigarettes    Last attempt to quit: 02/15/2013    Years since quitting: 4.1  . Smokeless tobacco: Never Used  Substance Use Topics  . Alcohol use: Yes  . Drug use: No     Allergies   Sulfa antibiotics; Sulfur; and Other   Review of Systems Review of Systems  Constitutional: Negative for chills and fever.  HENT: Positive for congestion and sore throat. Negative for ear pain, sinus pressure, sinus pain and trouble swallowing.   Eyes: Negative for pain and visual disturbance.  Respiratory: Positive for cough. Negative for choking, shortness of breath, wheezing and stridor.   Cardiovascular: Negative for chest pain and palpitations.  Gastrointestinal: Negative for abdominal pain, nausea and vomiting.  Musculoskeletal: Negative for arthralgias, back pain, myalgias, neck pain and neck stiffness.  Skin: Negative for color change, pallor and rash.  Neurological: Negative for dizziness, seizures, syncope, light-headedness and headaches.     Physical Exam Updated Vital Signs BP 139/79 (BP Location: Right Arm)   Pulse 96   Temp 98.6 F (37 C) (Oral)   Resp 18   SpO2 100%   Physical Exam  Constitutional: She appears well-developed and well-nourished.  Non-toxic appearance. She does not appear ill. No distress.  Afebrile, nontoxic-appearing, sitting comfortably in chair in no acute distress.  HENT:  Head: Normocephalic and atraumatic.  Mouth/Throat: Uvula is midline and mucous membranes are normal. Mucous membranes are not pale, not dry and not cyanotic. No oral lesions. No uvula swelling. Posterior oropharyngeal erythema present. No oropharyngeal exudate, posterior oropharyngeal edema or tonsillar abscesses. Tonsils are 1+ on the right. Tonsils are 1+ on the left. Tonsillar exudate.  Eyes: Conjunctivae are normal.  Neck: Normal range of motion. Neck supple.  Cardiovascular:  Normal rate, regular rhythm and normal heart sounds.  Pulmonary/Chest: Effort normal and breath sounds normal. No stridor. No respiratory distress. She has no wheezes. She has no rhonchi. She has no rales.  Abdominal: She exhibits no distension.  Musculoskeletal: She exhibits no edema.  Lymphadenopathy:    She has cervical adenopathy.  Neurological: She is alert.  Skin: Skin is warm and dry. No rash noted. No erythema. No pallor.  Psychiatric: She has a normal mood and affect.  Nursing note and vitals reviewed.    ED Treatments / Results  Labs (all labs ordered are listed, but only abnormal results are displayed) Labs Reviewed  RAPID STREP SCREEN (NOT AT [facility])  CULTURE, GROUP A STREP [facility])    EKG  EKG Interpretation None  Radiology Dg Chest 2 View  Result Date: 04/01/2017 CLINICAL DATA:  Cough, congestion, shortness of breath and weakness for 1 week slightly improved 3 days ago then recurrence of symptoms EXAM: CHEST  2 VIEW COMPARISON:  02/23/2016 FINDINGS: Normal heart size, mediastinal contours, and pulmonary vascularity. Mild chronic bronchitic changes. Lungs otherwise clear. No acute infiltrate, pleural effusion or pneumothorax. Bones unremarkable. IMPRESSION: Mild chronic bronchitic changes without acute infiltrate. Electronically Signed   By: Ulyses Southward M.D.   On: 04/01/2017 13:38    Procedures Procedures (including critical care time)  Medications Ordered in ED Medications - No data to display   Initial Impression / Assessment and Plan / ED Course  I have reviewed the triage vital signs and the nursing notes.  Pertinent labs & imaging results that were available during my care of the patient were reviewed by me and considered in my medical decision making (see chart for details).     Pt Afebrile with tonsillar exudate, cervical lymphadenopathy, & odynophagia; diagnosis of strep.  Discussed importance of water rehydration. Presentation non concerning for  PTA or infxn spread to soft tissue. No trismus or uvula deviation. Specific return precautions discussed. Pt able to drink water in ED without difficulty with intact airway. Recommended PCP follow up.  Chest x-ray negative and rapid strep negative.  On my exam findings of erythema and exudate will discharge patient home with antibiotics.  Discharge home with symptomatic relief and close follow-up with the wellness center.  Discussed strict return precautions and advised to return to the emergency department if experiencing any new or worsening symptoms. Instructions were understood and patient agreed with discharge plan.  Final Clinical Impressions(s) / ED Diagnoses   Final diagnoses:  Sore throat  Viral URI with cough    ED Discharge Orders        Ordered    amoxicillin (AMOXIL) 500 MG capsule  2 times daily     04/01/17 1559    benzonatate (TESSALON) 100 MG capsule  Every 8 hours     04/01/17 1559       Georgiana Shore, PA-C 04/01/17 1612    Lorre Nick, MD 04/02/17 [phone]

## 2017-04-01 NOTE — Discharge Instructions (Signed)
As discussed, use tea with honey, cough drops, throat sprays over-the-counter to help soothe your throat.  Chest x-ray was negative and your rapid strep was negative today.  Based on my exam findings, I decided to treat you with antibiotics today.  Take the entire course of antibiotics even if you feel better. Make sure that you stay well-hydrated and follow-up with your primary care provider at the wellness center.  Return if unable to swallow, difficulty breathing, facial swelling or other new concerning symptoms in the meantime.

## 2017-04-01 NOTE — ED Triage Notes (Signed)
Patient here from home with complaints of cough x3 days. Sore throat. Denies n/v.

## 2017-04-03 LAB — CULTURE, GROUP A STREP (THRC)

## 2017-05-17 ENCOUNTER — Other Ambulatory Visit: Payer: Self-pay

## 2017-05-17 ENCOUNTER — Encounter (HOSPITAL_COMMUNITY): Payer: Self-pay | Admitting: Emergency Medicine

## 2017-05-17 DIAGNOSIS — F17210 Nicotine dependence, cigarettes, uncomplicated: Secondary | ICD-10-CM | POA: Insufficient documentation

## 2017-05-17 DIAGNOSIS — R0902 Hypoxemia: Secondary | ICD-10-CM | POA: Insufficient documentation

## 2017-05-17 DIAGNOSIS — R0789 Other chest pain: Secondary | ICD-10-CM | POA: Insufficient documentation

## 2017-05-17 DIAGNOSIS — Z79899 Other long term (current) drug therapy: Secondary | ICD-10-CM | POA: Insufficient documentation

## 2017-05-17 DIAGNOSIS — R509 Fever, unspecified: Secondary | ICD-10-CM | POA: Insufficient documentation

## 2017-05-17 DIAGNOSIS — R197 Diarrhea, unspecified: Secondary | ICD-10-CM | POA: Insufficient documentation

## 2017-05-17 DIAGNOSIS — R112 Nausea with vomiting, unspecified: Secondary | ICD-10-CM | POA: Insufficient documentation

## 2017-05-17 DIAGNOSIS — R1010 Upper abdominal pain, unspecified: Secondary | ICD-10-CM | POA: Insufficient documentation

## 2017-05-17 LAB — CBC
HCT: 44.7 % (ref 36.0–46.0)
Hemoglobin: 15.2 g/dL — ABNORMAL HIGH (ref 12.0–15.0)
MCH: 30.1 pg (ref 26.0–34.0)
MCHC: 34 g/dL (ref 30.0–36.0)
MCV: 88.5 fL (ref 78.0–100.0)
PLATELETS: 277 10*3/uL (ref 150–400)
RBC: 5.05 MIL/uL (ref 3.87–5.11)
RDW: 13.7 % (ref 11.5–15.5)
WBC: 16.9 10*3/uL — ABNORMAL HIGH (ref 4.0–10.5)

## 2017-05-17 LAB — COMPREHENSIVE METABOLIC PANEL
ALT: 50 U/L (ref 14–54)
AST: 38 U/L (ref 15–41)
Albumin: 4.2 g/dL (ref 3.5–5.0)
Alkaline Phosphatase: 69 U/L (ref 38–126)
Anion gap: 15 (ref 5–15)
BILIRUBIN TOTAL: 0.5 mg/dL (ref 0.3–1.2)
BUN: 12 mg/dL (ref 6–20)
CO2: 23 mmol/L (ref 22–32)
CREATININE: 0.63 mg/dL (ref 0.44–1.00)
Calcium: 9.6 mg/dL (ref 8.9–10.3)
Chloride: 101 mmol/L (ref 101–111)
GFR calc Af Amer: >60 mL/min (ref >60)
Glucose, Bld: 108 mg/dL — ABNORMAL HIGH (ref 65–99)
Potassium: 3.5 mmol/L (ref 3.5–5.1)
Sodium: 139 mmol/L (ref 135–145)
TOTAL PROTEIN: 8.7 g/dL — AB (ref 6.5–8.1)

## 2017-05-17 LAB — I-STAT BETA HCG BLOOD, ED (MC, WL, AP ONLY): I-stat hCG, quantitative: <5 m[IU]/mL (ref <5)

## 2017-05-17 LAB — LIPASE, BLOOD: Lipase: 27 U/L (ref 11–51)

## 2017-05-17 NOTE — ED Notes (Signed)
Unable to collect labs at this time patient is in the restroom 

## 2017-05-17 NOTE — ED Triage Notes (Signed)
Per EMS=patient stated she stared the KETO diet today and is now having N/V/D-patient was sitting in her vehicle at the laundry mat with family requesting EMS transport to try and get into ED room ASAP

## 2017-05-18 ENCOUNTER — Emergency Department (HOSPITAL_COMMUNITY): Payer: Self-pay

## 2017-05-18 ENCOUNTER — Emergency Department (HOSPITAL_COMMUNITY)
Admission: EM | Admit: 2017-05-18 | Discharge: 2017-05-18 | Disposition: A | Discharge status: Home / Self Care | Payer: Self-pay | Attending: Emergency Medicine | Admitting: Emergency Medicine

## 2017-05-18 DIAGNOSIS — R0789 Other chest pain: Secondary | ICD-10-CM

## 2017-05-18 DIAGNOSIS — R197 Diarrhea, unspecified: Secondary | ICD-10-CM

## 2017-05-18 DIAGNOSIS — R112 Nausea with vomiting, unspecified: Secondary | ICD-10-CM

## 2017-05-18 DIAGNOSIS — R509 Fever, unspecified: Secondary | ICD-10-CM

## 2017-05-18 LAB — URINALYSIS, ROUTINE W REFLEX MICROSCOPIC
BILIRUBIN URINE: NEGATIVE
GLUCOSE, UA: NEGATIVE mg/dL
Hgb urine dipstick: NEGATIVE
KETONES UR: NEGATIVE mg/dL
Leukocytes, UA: NEGATIVE
Nitrite: NEGATIVE
PH: 5 (ref 5.0–8.0)
Protein, ur: NEGATIVE mg/dL
Specific Gravity, Urine: 1.018 (ref 1.005–1.030)

## 2017-05-18 LAB — D-DIMER, QUANTITATIVE: D-Dimer, Quant: 1.03 ug/mL-FEU — ABNORMAL HIGH (ref 0.00–0.50)

## 2017-05-18 MED ORDER — LOPERAMIDE HCL 2 MG PO CAPS: 2.0000 mg | ORAL_CAPSULE | Freq: Four times a day (QID) | ORAL | 0 refills | Status: DC | PRN

## 2017-05-18 MED ORDER — ONDANSETRON 4 MG PO TBDP: 4.0000 mg | ORAL_TABLET | Freq: Four times a day (QID) | ORAL | 0 refills | Status: DC | PRN

## 2017-05-18 MED ORDER — DICYCLOMINE HCL 20 MG PO TABS: 20.0000 mg | ORAL_TABLET | Freq: Three times a day (TID) | ORAL | 0 refills | Status: DC | PRN

## 2017-05-18 MED ORDER — PANTOPRAZOLE SODIUM 40 MG PO TBEC
40.0000 mg | DELAYED_RELEASE_TABLET | Freq: Once | ORAL | Status: AC
  Administered 2017-05-18: 40 mg via ORAL
  Filled 2017-05-18: qty 1

## 2017-05-18 MED ORDER — SODIUM CHLORIDE 0.9 % IV BOLUS (SEPSIS)
1000.0000 mL | Freq: Once | INTRAVENOUS | Status: AC
  Administered 2017-05-18: 1000 mL via INTRAVENOUS

## 2017-05-18 MED ORDER — IOPAMIDOL (ISOVUE-370) INJECTION 76%
100.0000 mL | Freq: Once | INTRAVENOUS | Status: AC | PRN
  Administered 2017-05-18: 100 mL via INTRAVENOUS

## 2017-05-18 MED ORDER — ONDANSETRON HCL 4 MG/2ML IJ SOLN
4.0000 mg | Freq: Once | INTRAMUSCULAR | Status: AC
  Administered 2017-05-18: 4 mg via INTRAVENOUS
  Filled 2017-05-18: qty 2

## 2017-05-18 MED ORDER — ONDANSETRON 4 MG PO TBDP: 4.0000 mg | ORAL_TABLET | Freq: Once | ORAL | Status: DC

## 2017-05-18 MED ORDER — DICYCLOMINE HCL 10 MG/ML IM SOLN
20.0000 mg | Freq: Once | INTRAMUSCULAR | Status: AC
  Administered 2017-05-18: 20 mg via INTRAMUSCULAR
  Filled 2017-05-18: qty 2

## 2017-05-18 MED ORDER — IOPAMIDOL (ISOVUE-370) INJECTION 76%
INTRAVENOUS | Status: AC
  Filled 2017-05-18: qty 100

## 2017-05-18 MED ORDER — LOPERAMIDE HCL 2 MG PO CAPS
2.0000 mg | ORAL_CAPSULE | Freq: Once | ORAL | Status: AC
  Administered 2017-05-18: 2 mg via ORAL
  Filled 2017-05-18: qty 1

## 2017-05-18 MED ORDER — ONDANSETRON 4 MG PO TBDP
4.0000 mg | ORAL_TABLET | Freq: Once | ORAL | Status: AC
  Administered 2017-05-18: 4 mg via ORAL
  Filled 2017-05-18: qty 1

## 2017-05-18 MED ORDER — GI COCKTAIL ~~LOC~~
30.0000 mL | Freq: Once | ORAL | Status: AC
  Administered 2017-05-18: 30 mL via ORAL
  Filled 2017-05-18: qty 30

## 2017-05-18 MED ORDER — ACETAMINOPHEN 500 MG PO TABS
1000.0000 mg | ORAL_TABLET | Freq: Once | ORAL | Status: AC
  Administered 2017-05-18: 1000 mg via ORAL
  Filled 2017-05-18: qty 2

## 2017-05-18 NOTE — Discharge Instructions (Signed)
Please avoid NSAIDs such as aspirin (Goody powders), ibuprofen (Motrin, Advil), naproxen (Aleve) as these may worsen your symptoms.  Tylenol 1000 mg every 6 hours is safe to take as long as you have no history of liver problems (heavy alcohol use, cirrhosis, hepatitis).  Please avoid spicy, acidic (citrus fruits, tomato based sauces, salsa), greasy, fatty foods.  Please avoid caffeine and alcohol.     You may use over-the-counter Tums, Maalox or Mylanta, Pepcid or Zantac, omeprazole (prilosec) or Nexium as needed for heartburn.

## 2017-05-18 NOTE — ED Provider Notes (Addendum)
TIME SEEN: 3:21 AM  CHIEF COMPLAINT: Nausea, vomiting, diarrhea  HPI: Patient is a 32 year old female with history of obesity who presents emergency department with 1 day of nausea, vomiting and diarrhea.  Complains of crampy abdominal pain mostly in the upper abdomen.  No known sick contacts.  No fever.  No dysuria, hematuria, vaginal bleeding or discharge.  Status post cholecystectomy.  States she has vomited 5 times today and is now having a sharp pain in her chest that she thinks is heartburn.  ROS: See HPI Constitutional: no fever  Eyes: no drainage  ENT: no runny nose   Cardiovascular:   chest pain  Resp: no SOB  GI: Diarrhea and vomiting GU: no dysuria Integumentary: no rash  Allergy: no hives  Musculoskeletal: no leg swelling  Neurological: no slurred speech ROS otherwise negative  PAST MEDICAL HISTORY/PAST SURGICAL HISTORY:  Past Medical History:  Diagnosis Date  . Abnormal Pap smear    HPV  . Anemia   . Chlamydia   . Eczema   . Gallstones   . Infection    urinary tract infection  . No pertinent past medical history   . Obesity   . Sickle cell trait (HCC)     MEDICATIONS:  Prior to Admission medications   Medication Sig Start Date End Date Taking? Authorizing Provider  acetaminophen (TYLENOL) 325 MG tablet Take 650 mg by mouth every 6 (six) hours as needed for moderate pain.     [provider]  benzonatate (TESSALON) 100 MG capsule Take 1 capsule (100 mg total) by mouth every 8 (eight) hours. 04/01/17   Georgiana Shore, PA-C  etonogestrel (NEXPLANON) 68 MG IMPL implant 1 each by Subdermal route once. Placed 06/2012    [provider]  fluticasone (FLONASE) 50 MCG/ACT nasal spray Place 2 sprays into both nostrils daily. 08/12/16   Lurene Shadow, PA-C  ibuprofen (ADVIL,MOTRIN) 800 MG tablet Take 1 tablet (800 mg total) by mouth 3 (three) times daily. Patient not taking: Reported on 01/05/2016 04/30/15   Rasch, Victorino Dike I, NP  metroNIDAZOLE  (FLAGYL) 500 MG tablet Take 1 tablet (500 mg total) by mouth 2 (two) times daily. Patient not taking: Reported on 02/23/2016 01/12/16   Hermina Staggers, MD  Multiple Vitamins-Minerals (MULTIVITAMIN WITH MINERALS) tablet Take 1 tablet by mouth daily.    [provider]  naproxen (NAPROSYN) 375 MG tablet Take 1 tablet (375 mg total) by mouth 2 (two) times daily. 08/12/16   Lurene Shadow, PA-C  predniSONE (DELTASONE) 20 MG tablet 3 tabs po day one, then 2 po daily x 4 days 08/12/16   Lurene Shadow, PA-C    ALLERGIES:  Allergies  Allergen Reactions  . Sulfa Antibiotics Anaphylaxis and Hives  . Sulfur Anaphylaxis  . Other Hives    Mayonnaise     SOCIAL HISTORY:  Social History   Tobacco Use  . Smoking status: Current Some Day Smoker    Packs/day: 0.25    Years: 1.00    Pack years: 0.25    Types: Cigarettes    Last attempt to quit: 02/15/2013    Years since quitting: 4.2  . Smokeless tobacco: Never Used  Substance Use Topics  . Alcohol use: Yes    FAMILY HISTORY: Family History  Problem Relation Age of Onset  . Hypertension Mother   . Heart disease Mother   . Hypertension Maternal Grandmother   . Heart disease Maternal Grandmother   . Cancer Maternal Aunt  leukemia  . Cancer Maternal Uncle        pancreatic  . Other Neg Hx     EXAM: BP 130/85 (BP Location: Right Arm)   Pulse (!) 109   Temp 99.3 F (37.4 C) (Oral)   Resp 18   Ht 4\' 10"  (1.473 m)   Wt 129.3 kg (285 lb)   SpO2 94%   BMI 59.57 kg/m  CONSTITUTIONAL: Alert and oriented and responds appropriately to questions. Well-appearing; well-nourished HEAD: Normocephalic EYES: Conjunctivae clear, pupils appear equal, EOMI ENT: normal nose; moist mucous membranes NECK: Supple, no meningismus, no nuchal rigidity, no LAD  CARD: RRR; S1 and S2 appreciated; no murmurs, no clicks, no rubs, no gallops RESP: Normal chest excursion without splinting or tachypnea; breath sounds clear and equal bilaterally;  no wheezes, no rhonchi, no rales, no hypoxia or respiratory distress, speaking full sentences ABD/GI: Normal bowel sounds; non-distended; soft, non-tender, no rebound, no guarding, no peritoneal signs, no hepatosplenomegaly BACK:  The back appears normal and is non-tender to palpation, there is no CVA tenderness EXT: Normal ROM in all joints; non-tender to palpation; no edema; normal capillary refill; no cyanosis, no calf tenderness or swelling    SKIN: Normal color for age and race; warm; no rash NEURO: Moves all extremities equally PSYCH: The patient's mood and manner are appropriate. Grooming and personal hygiene are appropriate.  MEDICAL DECISION MAKING: Patient here with nausea, vomiting or diarrhea.  Abdominal exam benign.  Suspect viral gastroenteritis.  Labs show mild leukocytosis.  She has no tenderness in the right lower quadrant.  Doubt appendicitis.  Doubt colitis or diverticulitis.  Doubt bowel obstruction.  She denies any urinary symptoms.  Is complaining of pain in her chest consistent with previous episodes of heartburn.  Will obtain EKG but suspect this is GI in nature.  Doubt esophageal rupture.  Will give oral medications and fluid challenge patient.  I do not feel she needs emergent CT of her abdomen pelvis.  ED PROGRESS: Patient now febrile and tachycardic.  She is receiving Tylenol.  Continues to be nauseated but able to tolerate fluids.  Heart rate in the 110 is.  Will give IV fluids and now IV Zofran.  Urine shows no sign of infection.  EKG shows no ischemic abnormality but shows sinus tachycardia.  Will obtain chest x-ray to rule out pneumonia.  Low suspicion for PE.  I suspect her tachycardia is secondary from fever but will monitor this closely.  6:55 AM  Patient has now had some mild hypoxia.  Her chest x-ray is clear.  Pain has improved in her chest and again I suspect this is GI in nature but given she is obese and now has hypoxia I feel we will need to obtain a d-dimer to  evaluate for possible PE.  Abdominal exam is still benign.  No further vomiting or diarrhea in the ED. she states she is feeling better.  I suspect that her hypoxia may be related to some sleep apnea but given she is tachycardic, hypoxic and complaining of chest pain I feel I need to rule out pulmonary embolus.  She has no risk factors for PE other than obesity.  Pt's d dimer elevated at 1.03.  Will order CTA of her chest.  Signed out to Dr. Patria Maneampos to follow up on CT scan.  I reviewed all nursing notes, vitals, pertinent previous records, EKGs, lab and urine results, imaging (as available).    EKG Interpretation  Date/Time:  Friday May 18 2017  04:03:54 EDT Ventricular Rate:  115 PR Interval:    QRS Duration: 75 QT Interval:  346 QTC Calculation: 479 R Axis:   46 Text Interpretation:  Sinus tachycardia Borderline T wave abnormalities Borderline prolonged QT interval No significant change since last tracing Confirmed by Dawnelle Warman, Baxter Hire [phone]) on 05/18/2017 4:20:16 AM         Theran Vandergrift, Layla Maw, DO 05/18/17 0750    Naiah Donahoe, Layla Maw, DO 05/18/17 6045

## 2017-06-15 ENCOUNTER — Ambulatory Visit: Payer: Self-pay | Admitting: Licensed Clinical Social Worker

## 2017-06-15 ENCOUNTER — Ambulatory Visit: Payer: Self-pay | Attending: Family Medicine | Admitting: Physician Assistant

## 2017-06-15 VITALS — BP 130/82 | HR 94 | Temp 98.2°F | Resp 18 | Ht 59.0 in | Wt 296.6 lb

## 2017-06-15 DIAGNOSIS — F331 Major depressive disorder, recurrent, moderate: Secondary | ICD-10-CM

## 2017-06-15 DIAGNOSIS — E669 Obesity, unspecified: Secondary | ICD-10-CM | POA: Insufficient documentation

## 2017-06-15 DIAGNOSIS — F419 Anxiety disorder, unspecified: Secondary | ICD-10-CM

## 2017-06-15 DIAGNOSIS — R12 Heartburn: Secondary | ICD-10-CM

## 2017-06-15 DIAGNOSIS — F17200 Nicotine dependence, unspecified, uncomplicated: Secondary | ICD-10-CM | POA: Insufficient documentation

## 2017-06-15 DIAGNOSIS — K2970 Gastritis, unspecified, without bleeding: Secondary | ICD-10-CM

## 2017-06-15 DIAGNOSIS — F329 Major depressive disorder, single episode, unspecified: Secondary | ICD-10-CM | POA: Insufficient documentation

## 2017-06-15 DIAGNOSIS — A6004 Herpesviral vulvovaginitis: Secondary | ICD-10-CM | POA: Insufficient documentation

## 2017-06-15 DIAGNOSIS — Z3046 Encounter for surveillance of implantable subdermal contraceptive: Secondary | ICD-10-CM

## 2017-06-15 MED ORDER — VALACYCLOVIR HCL 1 G PO TABS: 1000.0000 mg | ORAL_TABLET | Freq: Two times a day (BID) | ORAL | 0 refills | Status: DC

## 2017-06-15 NOTE — Progress Notes (Signed)
Bianca Cantrell  YNW:[id number]  MVH:[id number]  DOB - Mar 30, 1985  No chief complaint on file.      Subjective:   Bianca Cantrell is a 32 y.o. female here today for establishment of care. She has a past medical history of obesity, smoking, depression mixed with anxiety and herpes infection. She was in the emergency department on 05/18/2017 with complaints of nausea, vomiting and diarrhea in maior 5 days. She described a crampy abdominal pain that goes through to her back. Worsen heartburn. Usually worse in the morning when she has not eating anything. Sometimes has to double over in pain. Usually last for 5 minutes at a time but recurrent throughout multiple times during the day. In the emergency department her hCG was negative. Urinalysis negative. Chest x-ray normal. D-dimer elevated but a CTPA of her chest was normal. She was treated with Tylenol, IV fluids and Zofran.  She was prescribed Bentyl and Imodium, she did not take either one. She states that she made some lifestyle changes such as changing her eating habits, discontinuing sodas and she feels better intermittently. She is requesting an endoscopy. She has been doing some online reading.  She further states that she has a history of herpes since 2017. She feels that she has a flare. No definite lesions but she has parents and the paresthesia and the sensitivity vaginally and anally. Lastly she is due for her next blood 9 to be explanted. Dr. Clearance Coots implanted this previously.  ROS: GEN: denies fever or chills, denies change in weight Skin: denies lesions or rashes HEENT: denies headache, earache, epistaxis, sore throat, or neck pain LUNGS: denies SHOB, dyspnea, PND, orthopnea CV: denies CP or palpitations ABD:+ abd pain, N or V EXT: denies muscle spasms or swelling; no pain in lower ext, no weakness NEURO: + numbness or tingling low back, denies sz, stroke or TIA  ALLERGIES: Allergies  Allergen Reactions  . Sulfa Antibiotics  Anaphylaxis and Hives  . Sulfur Anaphylaxis  . Other Hives    Mayonnaise     PAST MEDICAL HISTORY: Past Medical History:  Diagnosis Date  . Abnormal Pap smear    HPV  . Anemia   . Chlamydia   . Eczema   . Gallstones   . Infection    urinary tract infection  . No pertinent past medical history   . Obesity   . Sickle cell trait (HCC)     PAST SURGICAL HISTORY: Past Surgical History:  Procedure Laterality Date  . APPENDECTOMY  2010  . CHOLECYSTECTOMY  2011    MEDICATIONS AT HOME: Prior to Admission medications   Medication Sig Start Date End Date Taking? Authorizing Provider  dicyclomine (BENTYL) 20 MG tablet Take 1 tablet (20 mg total) by mouth every 8 (eight) hours as needed for spasms (Abdominal cramping). Patient not taking: Reported on 06/15/2017 05/18/17   Ward, Layla Maw, DO  loperamide (IMODIUM) 2 MG capsule Take 1 capsule (2 mg total) by mouth 4 (four) times daily as needed for diarrhea or loose stools. Patient not taking: Reported on 06/15/2017 05/18/17   Ward, Layla Maw, DO  naproxen (NAPROSYN) 375 MG tablet Take 1 tablet (375 mg total) by mouth 2 (two) times daily. Patient not taking: Reported on 05/18/2017 08/12/16   Lurene Shadow, PA-C  ondansetron (ZOFRAN ODT) 4 MG disintegrating tablet Take 1 tablet (4 mg total) by mouth every 6 (six) hours as needed for nausea or vomiting. Patient not taking: Reported on 06/15/2017 05/18/17   Ward, Layla Maw,  DO  valACYclovir (VALTREX) 1000 MG tablet Take 1 tablet (1,000 mg total) by mouth 2 (two) times daily. 06/15/17   Vivianne MasterNoel, Sayre Witherington S, PA-C    Family History  Problem Relation Age of Onset  . Hypertension Mother   . Heart disease Mother   . Hypertension Maternal Grandmother   . Heart disease Maternal Grandmother   . Cancer Maternal Aunt        leukemia  . Cancer Maternal Uncle        pancreatic  . Other Neg Hx    Social-single, children,not working  Objective:   Vitals:   06/15/17 1153  BP: 130/82  Pulse: 94    Resp: 18  Temp: 98.2 F (36.8 C)  TempSrc: Oral  Weight: 296 lb 9.6 oz (134.5 kg)  Height: 4\' 11"  (1.499 m)    Exam-benign General appearance : Awake, alert, not in any distress. Speech Clear. Not toxic looking Neck: supple, no JVD. No cervical lymphadenopathy.  Chest:Good air entry bilaterally, no added sounds  CVS: S1 S2 regular, no murmurs.  Abdomen: Bowel sounds present, Non tender and not distended with no guarding, rigidity or rebound. Extremities: B/L Lower Ext shows no edema, both legs are warm to touch Neurology: Awake alert, and oriented X 3, CN II-XII intact, Non focal Skin:No Rash GYN-pt menstruating and declined exam  Assessment & Plan  1. Heartburn/Gastritis  -Cont Zantac, inc BID  -refused Bentyl  -requesting GI/endoscopy 2. HSV infection  -Valtrex 3. Contraception Management  -Nexplanon explant  -GYN referral 4. Depression m/w Anxiety  -declines meds  -seen by Jenel LucksJasmine Lewis, LCSW   Return in about 1 month (around 07/13/2017).  The patient was given clear instructions to go to ER or return to medical center if symptoms don't improve, worsen or new problems develop. The patient verbalized understanding. The patient was told to call to get lab results if they haven't heard anything in the next week.   Total time spent with patient was 35 min. Greater than 50 % of this visit was spent face to face counseling and coordinating care regarding risk factor modification, compliance importance and encouragement, education related to Ed visit.  This note has been created with Education officer, environmentalDragon speech recognition software and smart phrase technology. Any transcriptional errors are unintentional.    Scot Juniffany Chaske Paskett, PA-C [facility] [place], [place] [phone]   06/15/2017, 12:13 PM

## 2017-06-15 NOTE — BH Specialist Note (Signed)
Integrated Behavioral Health Initial Visit  MRN: [id number] Name: Bianca Cantrell  Number of Integrated Behavioral Health Clinician visits:: 1/6 Session Start time: 12:10 PM  Session End time: 12:30 PM Total time: 20 minutes  Type of Service: Integrated Behavioral Health- Individual/Family Interpretor:No. Interpretor Name and Language: N/A   Warm Hand Off Completed.       SUBJECTIVE: Bianca Cantrell is a 32 y.o. female accompanied by self Patient was referred by Zenda AlpersPA-C Noel for depression and anxiety. Patient reports the following symptoms/concerns: daily panic attacks, depressed mood, feelings of worry, difficulty sleeping, low energy, feeling bad about self, and decreased concentration Duration of problem: Pt reports diagnosis of Anxiety in 2015; Severity of problem: moderate  OBJECTIVE: Mood: Anxious and Affect: Depressed and Tearful Risk of harm to self or others: No plan to harm self or others  LIFE CONTEXT: Family and Social: Pt receives emotional support from family(cousins), best friend, and church School/Work: Pt is unemployed. 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 She enjoys walking and listening to music to cope with stressors Life Changes: Pt states difficulty managing anxiety with daily panic attacks. She has financial strain and limited support  GOALS ADDRESSED: Patient will: 1. Reduce symptoms of: anxiety and depression 2. Increase knowledge and/or ability of: coping skills and healthy habits  3. Demonstrate ability to: Increase adequate support systems for patient/family  INTERVENTIONS: Interventions utilized: Mindfulness or Management consultantelaxation Training, Supportive Counseling, Psychoeducation and/or Health Education and Link to [employer] [facility]  Standardized Assessments completed: GAD-7 and PHQ 2&9  ASSESSMENT: Patient currently experiencing depression and anxiety triggered by financial  strain and limited support in the community. She reports daily panic attacks, depressed mood, feelings of worry, difficulty sleeping, low energy, feeling bad about self, and decreased concentration. She receives support from family, a best friend, and church.    Patient may benefit from psychotherapy, psychoeducation, and medication management. LCSWA educated pt on correlation between one's physical and mental health. Therapeutic interventions were discussed to decrease panic attacks and promote positive feelings. Pt has a psychotherapy appointment scheduled with [facility] on May 25, 19. She has a hx of medication management (Paxil 2016) and is open to re-initiating it with new provider. LCSWA provided pt with crisis intervention and employment resources to assist with financial strain.  PLAN: 1. Follow up with behavioral health clinician on : Pt was encouraged to contact LCSWA if symptoms worsen or fail to improve to [facility] 2. Behavioral recommendations: LCSWA recommends that pt apply healthy coping skills discussed and utilize provided resources. Pt is encouraged to attend upcoming appt with [facility] 3. Referral(s): Community Resources:  Finances 4. "From scale of 1-10, how likely are you to follow plan?": 8/10  Bridgett LarssonJasmine D Lewis, LCSW 06/20/17 2:12 PM

## 2017-06-19 ENCOUNTER — Encounter: Payer: Self-pay | Admitting: Nurse Practitioner

## 2017-06-29 ENCOUNTER — Encounter: Payer: Self-pay | Admitting: Nurse Practitioner

## 2017-07-05 ENCOUNTER — Ambulatory Visit: Payer: Medicaid Other | Admitting: Nurse Practitioner

## 2017-07-12 ENCOUNTER — Ambulatory Visit (INDEPENDENT_AMBULATORY_CARE_PROVIDER_SITE_OTHER): Payer: Medicaid Other | Admitting: Nurse Practitioner

## 2017-07-19 ENCOUNTER — Ambulatory Visit: Payer: Medicaid Other | Admitting: Nurse Practitioner

## 2017-08-28 ENCOUNTER — Ambulatory Visit: Payer: Medicaid Other | Admitting: Nurse Practitioner

## 2017-09-07 ENCOUNTER — Encounter: Payer: Self-pay | Admitting: Nurse Practitioner

## 2017-09-07 ENCOUNTER — Ambulatory Visit (INDEPENDENT_AMBULATORY_CARE_PROVIDER_SITE_OTHER): Payer: Medicaid Other | Admitting: Nurse Practitioner

## 2017-09-07 VITALS — BP 128/70 | HR 92 | Ht <= 58 in | Wt 294.4 lb

## 2017-09-07 DIAGNOSIS — R1013 Epigastric pain: Secondary | ICD-10-CM

## 2017-09-07 NOTE — Progress Notes (Signed)
Chief Complaint:  Abdominal pain   Referring Provider:   self     ASSESSMENT AND PLAN;   1. 32 yo female with chronic epigastric pain radiating through to back. No associated N/V or weight loss. Pain only occurs in am upon rising, last ~ 5 minutes. Apple cider helping. PPI's / H2 blocker were never very helpful. The pain doesn't seem GI in origin but it is getting worse and she would like workup. Negative ED evaluations through the years for the pain.   -Will arrange for EGD to be done at hospital due to BMI. The risks and benefits of EGD were discussed and the patient agrees to proceed.    2. Morbid obesity. BMI 61   HPI:    32 year old female with history of obesity, depression/anxiety, smoking, and chronic intermittent upper abdominal pain.  She is status post appendectomy and cholecystectomy. Evaluated in ED several times through the years for epigastric pain. Also evaluated by [facility] two years ago, records not available but patient says she never had endoscopic workup. The pain has never resolved is has been getting worse over the past year. The epigastric pain radiates through to her back, generally starts in am upon waking up. It is not postprandial though spicy food the night before will always cause the pain. When pain starts it escalates over 5 minute period then generally resolves until the next morning.. She has been tried on PPI and H2 blockers without much improvement. She has been taking apple cider to prevent the pain and episodes have decreased from ~ 4 times a week to twice a week  No associated N/V. No weight loss. Bowel movements are generally normal, depending on diet. No blood in stool  CT abd/pelvis in 2015 for abdominal pain was unrevealing. CTA chest March 2019 for stabbing chest pain was negative but study limited due to body habitus.    Past Medical History:  Diagnosis Date  . Abnormal Pap smear    HPV  . Anemia   . Chlamydia   . Eczema   . Gallstones    . Infection    urinary tract infection  . No pertinent past medical history   . Obesity   . Sickle cell trait [facility])      Past Surgical History:  Procedure Laterality Date  . APPENDECTOMY  2010  . CHOLECYSTECTOMY  2011   Family History  Problem Relation Age of Onset  . Hypertension Mother   . Heart disease Mother   . Hypertension Maternal Grandmother   . Heart disease Maternal Grandmother   . Cancer Maternal Aunt        leukemia  . Cancer Maternal Uncle        pancreatic  . Other Neg Hx   . Colon cancer Neg Hx   . Esophageal cancer Neg Hx    Social History   Tobacco Use  . Smoking status: Current Some Day Smoker    Packs/day: 0.25    Years: 1.00    Pack years: 0.25    Types: Cigarettes    Last attempt to quit: 02/15/2013    Years since quitting: 4.5  . Smokeless tobacco: Never Used  . Tobacco comment: when she drinks alcohol  Substance Use Topics  . Alcohol use: Yes    Comment: 1 bottle liquior on weekends  . Drug use: No   Current Outpatient Medications  Medication Sig Dispense Refill  . valACYclovir (VALTREX) 1000 MG tablet Take 1 tablet (1,000  mg total) by mouth 2 (two) times daily. (Patient not taking: Reported on 09/07/2017) 20 tablet 0   No current facility-administered medications for this visit.    Allergies  Allergen Reactions  . Sulfa Antibiotics Anaphylaxis and Hives  . Sulfur Anaphylaxis  . Other Hives    Mayonnaise      Review of Systems: All systems reviewed and negative except where noted in HPI.   Creatinine clearance cannot be calculated (Patient's most recent lab result is older than the maximum 21 days allowed.)   Physical Exam:    Wt Readings from Last 3 Encounters:  09/07/17 294 lb 6.4 oz (133.5 kg)  06/15/17 296 lb 9.6 oz (134.5 kg)  05/17/17 285 lb (129.3 kg)    BP 128/70   Pulse 92   Ht 4\' 10"  (1.473 m)   Wt 294 lb 6.4 oz (133.5 kg)   BMI 61.53 kg/m  Constitutional:  Pleasant female in no acute  distress. Psychiatric: Normal mood and affect. Behavior is normal. EENT: Pupils normal.  Conjunctivae are normal. No scleral icterus. Neck supple.  Cardiovascular: Normal rate, regular rhythm. No edema Pulmonary/chest: Effort normal and breath sounds normal. No wheezing, rales or rhonchi. Abdominal: Soft, nondistended, nontender. Bowel sounds active throughout. There are no masses palpable.  Neurological: Alert and oriented to person place and time. Skin: Skin is warm and dry. No rashes noted.  Willette ClusterPaula Guenther, NP  09/07/2017, 2:25 PM  Cc:  Brock BadHarper, Charles A, MD

## 2017-09-07 NOTE — Patient Instructions (Signed)

## 2017-09-11 ENCOUNTER — Encounter: Payer: Self-pay | Admitting: Nurse Practitioner

## 2017-09-11 NOTE — Progress Notes (Signed)
Agree with assessment and plan as outlined.  

## 2017-09-12 ENCOUNTER — Encounter (HOSPITAL_COMMUNITY): Payer: Self-pay | Admitting: Emergency Medicine

## 2017-09-12 ENCOUNTER — Ambulatory Visit (HOSPITAL_COMMUNITY)
Admission: EM | Admit: 2017-09-12 | Discharge: 2017-09-12 | Disposition: A | Discharge status: Home / Self Care | Payer: Medicaid Other | Attending: Family Medicine | Admitting: Family Medicine

## 2017-09-12 ENCOUNTER — Other Ambulatory Visit: Payer: Self-pay

## 2017-09-12 DIAGNOSIS — R350 Frequency of micturition: Secondary | ICD-10-CM

## 2017-09-12 DIAGNOSIS — Z3202 Encounter for pregnancy test, result negative: Secondary | ICD-10-CM

## 2017-09-12 DIAGNOSIS — N898 Other specified noninflammatory disorders of vagina: Secondary | ICD-10-CM

## 2017-09-12 DIAGNOSIS — F17210 Nicotine dependence, cigarettes, uncomplicated: Secondary | ICD-10-CM | POA: Insufficient documentation

## 2017-09-12 DIAGNOSIS — M25562 Pain in left knee: Secondary | ICD-10-CM

## 2017-09-12 LAB — POCT URINALYSIS DIP (DEVICE)
Bilirubin Urine: NEGATIVE
Glucose, UA: NEGATIVE mg/dL
HGB URINE DIPSTICK: NEGATIVE
Ketones, ur: NEGATIVE mg/dL
LEUKOCYTES UA: NEGATIVE
Nitrite: NEGATIVE
PH: 5.5 (ref 5.0–8.0)
Protein, ur: NEGATIVE mg/dL
SPECIFIC GRAVITY, URINE: 1.02 (ref 1.005–1.030)
UROBILINOGEN UA: 0.2 mg/dL (ref 0.0–1.0)

## 2017-09-12 LAB — POCT PREGNANCY, URINE: Preg Test, Ur: NEGATIVE

## 2017-09-12 MED ORDER — NAPROXEN 375 MG PO TABS: 375.0000 mg | ORAL_TABLET | Freq: Two times a day (BID) | ORAL | 0 refills | Status: DC

## 2017-09-12 MED ORDER — METRONIDAZOLE 500 MG PO TABS: 500.0000 mg | ORAL_TABLET | Freq: Two times a day (BID) | ORAL | 0 refills | Status: DC

## 2017-09-12 MED ORDER — FLUCONAZOLE 200 MG PO TABS: ORAL_TABLET | ORAL | 0 refills | Status: DC

## 2017-09-12 NOTE — ED Provider Notes (Signed)
[facility]   [id number] 09/12/17 Arrival Time: 1552  SUBJECTIVE: History from: patient. Bianca Cantrell is a 32 y.o. female complains of left knee pain that began 1 week ago.  Denies a precipitating event or specific injury.  Localizes the pain to the front and back of knee.  Describes the pain as intermittent and dull/ achy in character.  Has tried OTC medications like tylenol without relief.  Symptoms are made worse with sitting.  Denies similar symptoms in the past.  Complains of effusion.  Denies fever, chills, erythema, ecchymosis, weakness, numbness and tingling.      Patient also complains of strong urine smell and increased urinary frequency for the past week.  She denies a precipitating event, recent sexual encounter, or decreased bathroom breaks.  She has tried increasing her water intake and drinking cranberry juice without relief.  Her symptoms are made worse with urination.  She reports similar symptoms in the past that improved with antibiotic.  She denies  nausea, vomiting, abdominal pain, flank pain, hematuria, or incontinence  Patient also complains of vaginal discharge that began a "couple" of weeks ago.  Denies a precipitating event, recent sexual encounter, but is about to start her menstrual cycle.  Describes discharge as thick and gray.  Denies alleviating or aggravating factors.  Reports hx of BV and yeast with similar symptoms.  Complains of associated vaginal itching.  Denies pelvic pain, vaginal bleeding, vaginal rashes or lesions, or abdominal pain.    Recent STI testing negative.    No LMP recorded. Patient has had an implant.  ROS: As per HPI.  Past Medical History:  Diagnosis Date  . Abnormal Pap smear    HPV  . Anemia   . Chlamydia   . Eczema   . Gallstones   . Infection    urinary tract infection  . No pertinent past medical history   . Obesity   . Sickle cell trait [facility])    Past Surgical History:  Procedure Laterality Date  . APPENDECTOMY   2010  . CHOLECYSTECTOMY  2011   Allergies  Allergen Reactions  . Sulfa Antibiotics Anaphylaxis and Hives  . Sulfur Anaphylaxis  . Other Hives    Mayonnaise    No current facility-administered medications on file prior to encounter.    Current Outpatient Medications on File Prior to Encounter  Medication Sig Dispense Refill  . valACYclovir (VALTREX) 1000 MG tablet Take 1 tablet (1,000 mg total) by mouth 2 (two) times daily. (Patient not taking: Reported on 09/07/2017) 20 tablet 0   Social History   Socioeconomic History  . Marital status: Divorced    Spouse name: Not on file  . Number of children: Not on file  . Years of education: Not on file  . Highest education level: Not on file  Occupational History  . Not on file  Social Needs  . Financial resource strain: Not on file  . Food insecurity:    Worry: Not on file    Inability: Not on file  . Transportation needs:    Medical: Not on file    Non-medical: Not on file  Tobacco Use  . Smoking status: Current Some Day Smoker    Packs/day: 0.25    Years: 1.00    Pack years: 0.25    Types: Cigarettes    Last attempt to quit: 02/15/2013    Years since quitting: 4.5  . Smokeless tobacco: Never Used  . Tobacco comment: when she drinks alcohol  Substance and  Sexual Activity  . Alcohol use: Yes    Comment: 1 bottle liquior on weekends  . Drug use: No  . Sexual activity: Never    Partners: Male    Birth control/protection: Implant  Lifestyle  . Physical activity:    Days per week: Not on file    Minutes per session: Not on file  . Stress: Not on file  Relationships  . Social connections:    Talks on phone: Not on file    Gets together: Not on file    Attends religious service: Not on file    Active member of club or organization: Not on file    Attends meetings of clubs or organizations: Not on file    Relationship status: Not on file  . Intimate partner violence:    Fear of current or ex partner: Not on file     Emotionally abused: Not on file    Physically abused: Not on file    Forced sexual activity: Not on file  Other Topics Concern  . Not on file  Social History Narrative  . Not on file   Family History  Problem Relation Age of Onset  . Hypertension Mother   . Heart disease Mother   . Hypertension Maternal Grandmother   . Heart disease Maternal Grandmother   . Cancer Maternal Aunt        leukemia  . Cancer Maternal Uncle        pancreatic  . Other Neg Hx   . Colon cancer Neg Hx   . Esophageal cancer Neg Hx     OBJECTIVE:  Vitals:   09/12/17 1627  BP: 140/86  Pulse: 95  Resp: (!) 22  Temp: 98 F (36.7 C)  TempSrc: Oral  SpO2: 97%    General appearance: AOx3; in no acute distress.  Head: NCAT Lungs: CTA bilaterally Heart: RRR.  Clear S1 and S2 without murmur, gallops, or rubs.  Radial pulses 2+ bilaterally. Abdomen: protuberant; soft; non-distended; normal active bowel sounds; nontender to light or deep palpation; no guarding GU: Declines, but agrees to vaginal self-swab Back: No CVA tenderness Musculoskeletal: Left knee Inspection: Skin warm, dry, clear and intact without obvious erythema, effusion, or ecchymosis.  Palpation: Nontender to palpation ROM: FROM active and passive Strength: 5/5 hip flexion, 5/5 knee abduction, 5/5 knee adduction, 5/5 knee flexion, 5/5 knee extension, 5/5 dorsiflexion, 5/5 plantar flexion Stability: Anterior/ posterior drawer intact Skin: warm and dry Neurologic: Ambulates without difficulty; Sensation intact about the lower extremities Psychological: alert and cooperative; normal mood and affect  LABS: Results for orders placed or performed during the hospital encounter of 09/12/17 (from the past 24 hour(s))  POCT urinalysis dip (device)     Status: None   Collection Time: 09/12/17  4:35 PM  Result Value Ref Range   Glucose, UA NEGATIVE NEGATIVE mg/dL   Bilirubin Urine NEGATIVE NEGATIVE   Ketones, ur NEGATIVE NEGATIVE mg/dL    Specific Gravity, Urine 1.020 1.005 - 1.030   Hgb urine dipstick NEGATIVE NEGATIVE   pH 5.5 5.0 - 8.0   Protein, ur NEGATIVE NEGATIVE mg/dL   Urobilinogen, UA 0.2 0.0 - 1.0 mg/dL   Nitrite NEGATIVE NEGATIVE   Leukocytes, UA NEGATIVE NEGATIVE  Pregnancy, urine POC     Status: None   Collection Time: 09/12/17  4:37 PM  Result Value Ref Range   Preg Test, Ur NEGATIVE NEGATIVE   ASSESSMENT & PLAN:  1. Acute pain of left knee   2. Urinary frequency  3. Vaginal discharge     Meds ordered this encounter  Medications  . naproxen (NAPROSYN) 375 MG tablet    Sig: Take 1 tablet (375 mg total) by mouth 2 (two) times daily.    Dispense:  20 tablet    Refill:  0    Order Specific Question:   Supervising Provider    Answer:   Isa RankinMURRAY, LAURA WILSON [phone][[id number]]  . metroNIDAZOLE (FLAGYL) 500 MG tablet    Sig: Take 1 tablet (500 mg total) by mouth 2 (two) times daily.    Dispense:  14 tablet    Refill:  0    Order Specific Question:   Supervising Provider    Answer:   Isa RankinMURRAY, LAURA WILSON [phone][[id number]]  . fluconazole (DIFLUCAN) 200 MG tablet    Sig: Take one pill by mouth, wait 72 hours, and then take second dose by mouth    Dispense:  2 tablet    Refill:  0    Order Specific Question:   Supervising Provider    Answer:   Isa RankinMURRAY, LAURA WILSON [[id number]]   Orders Placed This Encounter  Procedures  . Apply knee sleeve    Standing Status:   Standing    Number of Occurrences:   1    Order Specific Question:   Laterality    Answer:   Left  . POCT urinalysis dip (device)    Standing Status:   Standing    Number of Occurrences:   1  . Pregnancy, urine POC    Standing Status:   Standing    Number of Occurrences:   1    Brace given Continue conservative management of rest, ice, and gentle stretches Take naproxen as needed for pain relief (may cause abdominal discomfort, ulcers, and GI bleeds avoid taking with other NSAIDs)  Push fluids and get plenty of rest.   Vaginal swab obtained.  We will  follow up with you regarding the results.  Please abstain from sexual activity in the meantime Prescription for metronidazole and diflucan sent.  Take as prescribed and to completion Do not take metronidazole while drinking alcohol  Follow up with PCP if symptoms persists Return here or go to ER if you have any new or worsening symptoms such as fever, worsening abdominal pain, nausea/vomiting, flank pain, fever, chills, chest pain, abdominal pain, changes in bowel or bladder habits, pain radiating into lower legs, etc...  Reviewed expectations re: course of current medical issues. Questions answered. Outlined signs and symptoms indicating need for more acute intervention. Patient verbalized understanding. After Visit Summary given.    Rennis HardingWurst, Kiani Wurtzel, PA-C 09/12/17 [id number]

## 2017-09-12 NOTE — Discharge Instructions (Addendum)
Brace given Continue conservative management of rest, ice, and gentle stretches Take naproxen as needed for pain relief (may cause abdominal discomfort, ulcers, and GI bleeds avoid taking with other NSAIDs)  Push fluids and get plenty of rest.   Vaginal swab obtained.  We will follow up with you regarding the results.  Please abstain from sexual activity in the meantime Prescription for metronidazole and diflucan sent.  Take as prescribed and to completion Do not take metronidazole while drinking alcohol  Follow up with PCP if symptoms persists Return here or go to ER if you have any new or worsening symptoms such as fever, worsening abdominal pain, nausea/vomiting, flank pain, fever, chills, chest pain, abdominal pain, changes in bowel or bladder habits, pain radiating into lower legs, etc...)

## 2017-09-12 NOTE — ED Triage Notes (Signed)
Left flank, left leg pain for a week..initially had swelling in both legs, right one improved.  Left leg continues to hurt patient .    Patient is concerned for uti.  Odor to urine, right flank pain .  This too has been bothering patient for a week

## 2017-09-13 ENCOUNTER — Encounter: Payer: Self-pay | Admitting: *Deleted

## 2017-09-13 ENCOUNTER — Encounter: Payer: Self-pay | Admitting: Obstetrics

## 2017-09-13 ENCOUNTER — Other Ambulatory Visit (HOSPITAL_COMMUNITY)
Admission: RE | Admit: 2017-09-13 | Discharge: 2017-09-13 | Disposition: A | Discharge status: Home / Self Care | Payer: Medicaid Other | Source: Ambulatory Visit | Attending: Obstetrics | Admitting: Obstetrics

## 2017-09-13 ENCOUNTER — Ambulatory Visit (INDEPENDENT_AMBULATORY_CARE_PROVIDER_SITE_OTHER): Payer: Medicaid Other | Admitting: Obstetrics

## 2017-09-13 VITALS — BP 116/77 | HR 76 | Wt 297.0 lb

## 2017-09-13 DIAGNOSIS — Z3046 Encounter for surveillance of implantable subdermal contraceptive: Secondary | ICD-10-CM

## 2017-09-13 DIAGNOSIS — Z01419 Encounter for gynecological examination (general) (routine) without abnormal findings: Secondary | ICD-10-CM

## 2017-09-13 DIAGNOSIS — Z3202 Encounter for pregnancy test, result negative: Secondary | ICD-10-CM

## 2017-09-13 DIAGNOSIS — Z309 Encounter for contraceptive management, unspecified: Secondary | ICD-10-CM

## 2017-09-13 LAB — POCT URINE PREGNANCY: PREG TEST UR: NEGATIVE

## 2017-09-13 LAB — CERVICOVAGINAL ANCILLARY ONLY
Bacterial vaginitis: POSITIVE — AB
CANDIDA VAGINITIS: NEGATIVE
CHLAMYDIA, DNA PROBE: NEGATIVE
NEISSERIA GONORRHEA: NEGATIVE
TRICH (WINDOWPATH): NEGATIVE

## 2017-09-13 NOTE — Progress Notes (Signed)
Subjective:        Bianca Cantrell is a 32 y.o. female here for a routine exam.  Current complaints: None.    Personal health questionnaire:  Is patient Bianca Cantrell, have a family history of breast and/or ovarian cancer: no Is there a family history of uterine cancer diagnosed at age < 8, gastrointestinal cancer, urinary tract cancer, family member who is a Personnel officer syndrome-associated carrier: no Is the patient overweight and hypertensive, family history of diabetes, personal history of gestational diabetes, preeclampsia or PCOS: no Is patient over 21, have PCOS,  family history of premature CHD under age 9, diabetes, smoke, have hypertension or peripheral artery disease:  no At any time, has a partner hit, kicked or otherwise hurt or frightened you?: no Over the past 2 weeks, have you felt down, depressed or hopeless?: no Over the past 2 weeks, have you felt little interest or pleasure in doing things?:no   Gynecologic History Patient's last menstrual period was 06/04/2017. Contraception: Nexplanon Last Pap: 2016. Results were: normal Last mammogram: n/a. Results were: n/a  Obstetric History OB History  Gravida Para Term Preterm AB Living  4 4 4  0 0 4  SAB TAB Ectopic Multiple Live Births  0 0 0 0 4    # Outcome Date GA Lbr Len/2nd Weight Sex Delivery Anes PTL Lv  4 Term 01/07/12 [redacted]w[redacted]d 04:00 / 00:04 7 lb 15.5 oz (3.615 kg) F Vag-Spont None  LIV  3 Term 02/11/11 [redacted]w[redacted]d 12:30 / 00:26 7 lb 8.3 oz (3.41 kg) F Vag-Spont EPI  LIV  2 Term     M Vag-Spont EPI  LIV  1 Term     M Vag-Spont EPI  LIV    Past Medical History:  Diagnosis Date  . Abnormal Pap smear    HPV  . Anemia   . Chlamydia   . Eczema   . Gallstones   . Infection    urinary tract infection  . No pertinent past medical history   . Obesity   . Sickle cell trait [facility])     Past Surgical History:  Procedure Laterality Date  . APPENDECTOMY  2010  . CHOLECYSTECTOMY  2011     Current Outpatient  Medications:  .  valACYclovir (VALTREX) 1000 MG tablet, Take 1 tablet (1,000 mg total) by mouth 2 (two) times daily., Disp: 20 tablet, Rfl: 0 .  fluconazole (DIFLUCAN) 200 MG tablet, Take one pill by mouth, wait 72 hours, and then take second dose by mouth (Patient not taking: Reported on 09/13/2017), Disp: 2 tablet, Rfl: 0 .  metroNIDAZOLE (FLAGYL) 500 MG tablet, Take 1 tablet (500 mg total) by mouth 2 (two) times daily. (Patient not taking: Reported on 09/13/2017), Disp: 14 tablet, Rfl: 0 .  naproxen (NAPROSYN) 375 MG tablet, Take 1 tablet (375 mg total) by mouth 2 (two) times daily. (Patient not taking: Reported on 09/13/2017), Disp: 20 tablet, Rfl: 0 Allergies  Allergen Reactions  . Sulfa Antibiotics Anaphylaxis and Hives  . Sulfur Anaphylaxis  . Other Hives    Mayonnaise     Social History   Tobacco Use  . Smoking status: Current Some Day Smoker    Packs/day: 0.25    Years: 1.00    Pack years: 0.25    Types: Cigarettes    Last attempt to quit: 02/15/2013    Years since quitting: 4.5  . Smokeless tobacco: Never Used  . Tobacco comment: when she drinks alcohol  Substance Use Topics  . Alcohol  use: Yes    Comment: 1 bottle liquior on weekends    Family History  Problem Relation Age of Onset  . Hypertension Mother   . Heart disease Mother   . Hypertension Maternal Grandmother   . Heart disease Maternal Grandmother   . Cancer Maternal Aunt        leukemia  . Cancer Maternal Uncle        pancreatic  . Other Neg Hx   . Colon cancer Neg Hx   . Esophageal cancer Neg Hx       Review of Systems  Constitutional: negative for fatigue and weight loss Respiratory: negative for cough and wheezing Cardiovascular: negative for chest pain, fatigue and palpitations Gastrointestinal: negative for abdominal pain and change in bowel habits Musculoskeletal:negative for myalgias Neurological: negative for gait problems and tremors Behavioral/Psych: negative for abusive relationship,  depression Endocrine: negative for temperature intolerance    Genitourinary:negative for abnormal menstrual periods, genital lesions, hot flashes, sexual problems and vaginal discharge Integument/breast: negative for breast lump, breast tenderness, nipple discharge and skin lesion(s)    Objective:       BP 116/77   Pulse 76   Wt 297 lb (134.7 kg)   LMP 06/04/2017   BMI 62.07 kg/m  General:   alert  Skin:   no rash or abnormalities  Lungs:   clear to auscultation bilaterally  Heart:   regular rate and rhythm, S1, S2 normal, no murmur, click, rub or gallop  Breasts:   normal without suspicious masses, skin or nipple changes or axillary nodes  Abdomen:  normal findings: no organomegaly, soft, non-tender and no hernia  Pelvis:  External genitalia: normal general appearance Urinary system: urethral meatus normal and bladder without fullness, nontender Vaginal: normal without tenderness, induration or masses Cervix: normal appearance Adnexa: normal bimanual exam Uterus: anteverted and non-tender, normal size   Lab Review Urine pregnancy test Labs reviewed yes Radiologic studies reviewed no  50% of 30 min visit spent on counseling and coordination of care.   Assessment:     1. Encounter for routine gynecological examination with Papanicolaou smear of cervix Rx: - Cytology - PAP - POCT urine pregnancy  2. Nexplanon removal - see procedure note   Plan:    Education reviewed: calcium supplements, depression evaluation, low fat, low cholesterol diet, safe sex/STD prevention, self breast exams and weight bearing exercise. Contraception: none. Follow up in: 1 year.   No orders of the defined types were placed in this encounter.  Orders Placed This Encounter  Procedures  . POCT urine pregnancy    Brock BadHARLES A. Averiana Clouatre MD 09-13-2017   NEXPLANON REMOVAL NOTE  Date of LMP:   unknown  Contraception used: *Nexplanon   Indications:  The patient desires contraception.  She  understands risks, benefits, and alternatives to Implanon and would like to proceed.  Anesthesia:   Lidocaine 1% plain.  Procedure:  A time-out was performed confirming the procedure and the patient's allergy status.  Complications: None                      The rod was palpated and the area was sterilely prepped.  The area beneath the distal tip was anesthetized with 1% xylocaine and the skin incised                       Over the tip and the tip was exposed, grasped with forcep and removed intact.  A single suture of 4-0  Vicryl was used to close incision.  Steri strip                       And a bandage applied and the arm was wrapped with gauze bandage.  The patient tolerated well.  Instructions:  The patient was instructed to remove the dressing in 24 hours and that some bruising is to be expected.  She was advised to use over the counter analgesics as needed for any pain at the site.  She is to keep the area dry for 24 hours and to call if her hand or arm becomes cold, numb, or blue.  Return visit:  Return in 2 weeks   Brock Bad MD 09-13-2017

## 2017-09-13 NOTE — Progress Notes (Signed)
Pt presents for Nexplanon removal. Nexplanon expired 2 years ago. She does not desire BC at this time. Needs pap today.

## 2017-09-17 ENCOUNTER — Telehealth (HOSPITAL_COMMUNITY): Payer: Self-pay

## 2017-09-17 NOTE — Telephone Encounter (Signed)
Bacterial Vaginosis test is positive.  Prescription for metronidazole was given at the urgent care visit. Attempted to reach patient. No answer at this time. 

## 2017-09-18 LAB — CYTOLOGY - PAP: Diagnosis: NEGATIVE

## 2017-09-27 ENCOUNTER — Ambulatory Visit: Payer: Medicaid Other | Admitting: Obstetrics

## 2017-10-02 ENCOUNTER — Ambulatory Visit: Payer: Medicaid Other | Admitting: Nurse Practitioner

## 2017-10-31 ENCOUNTER — Ambulatory Visit (HOSPITAL_COMMUNITY)
Admission: EM | Admit: 2017-10-31 | Discharge: 2017-10-31 | Disposition: A | Discharge status: Home / Self Care | Payer: Self-pay | Attending: Family Medicine | Admitting: Family Medicine

## 2017-10-31 ENCOUNTER — Encounter (HOSPITAL_COMMUNITY): Payer: Self-pay | Admitting: Emergency Medicine

## 2017-10-31 ENCOUNTER — Other Ambulatory Visit: Payer: Self-pay

## 2017-10-31 DIAGNOSIS — R51 Headache: Secondary | ICD-10-CM

## 2017-10-31 DIAGNOSIS — R519 Headache, unspecified: Secondary | ICD-10-CM

## 2017-10-31 MED ORDER — KETOROLAC TROMETHAMINE 60 MG/2ML IM SOLN
60.0000 mg | Freq: Once | INTRAMUSCULAR | Status: AC
  Administered 2017-10-31: 60 mg via INTRAMUSCULAR

## 2017-10-31 MED ORDER — KETOROLAC TROMETHAMINE 60 MG/2ML IM SOLN
INTRAMUSCULAR | Status: AC
  Filled 2017-10-31: qty 2

## 2017-10-31 NOTE — Discharge Instructions (Signed)

## 2017-10-31 NOTE — ED Triage Notes (Addendum)
Patient reports seeing spots in field of vision and feeling dizzy.  This started around 3:30 pm today.  Patient reports sudden onset of left head pain approx 20 minutes later.  Pain in left side of head is throbbing.  Pain is not hurting quite as bad as at onset.  Patient has had acetaminophen.  Mother and aunts have migraines.  Patient has never been diagnosed as having migraine.    Patient had birth control implant removed after having it for 5 years.  Implant removed one month ago

## 2017-11-07 NOTE — ED Provider Notes (Signed)
[facility]   [id number] 10/31/17 Arrival Time: 1927  ASSESSMENT & PLAN:  1. Acute nonintractable headache, unspecified headache type    Meds ordered this encounter  Medications  . ketorolac (TORADOL) injection 60 mg   Observation overnight. ED if abrupt worsening or if little improvement. Agrees to f/u tomorrow morning if not getting better.  Reviewed expectations re: course of current medical issues. Questions answered. Outlined signs and symptoms indicating need for more acute intervention. Patient verbalized understanding. After Visit Summary given.   SUBJECTIVE:  Bianca Cantrell is a 32 y.o. female who presents with complaint of a headache. Reports gradual onset today. Maybe slightly yesterday. Location: temporal, frontal bilaterally (but L>R) without radiation. History of headaches: occasional but has never been dx with migraines. Similar headaches in the past. Precipitating factors include: none which have been determined. Associated symptoms: Preceding aura: no. Nausea/vomiting: no. Vision changes: no. ("Maybe saw a few spots at onset of headache.") Increased sensitivity to light and to noises: no. Fever: no. Sinus pressure/congestion: mild and frontal. Extremity weakness: none. Home treatment has included Tylenol with some relief. Current headache does not limit normal daily activities. The patient denies depression, dizziness, loss of balance, muscle weakness, numbness of extremities and speech difficulties. No worsening of headache since onset. In fact, some improvement.  ROS: As per HPI.   OBJECTIVE:  Vitals:   10/31/17 2004  BP: 131/84  Pulse: 82  Resp: 20  Temp: 97.7 F (36.5 C)  TempSrc: Oral  SpO2: 98%    General appearance: alert; no distress Eyes: PERRLA; EOMI; conjunctiva normal HENT: normocephalic; atraumatic Neck: supple with FROM Lungs: clear to auscultation bilaterally Heart: regular rate and rhythm Extremities: no edema;  symmetrical with no gross deformities Skin: warm and dry Neurologic: CN 2-12 grossly intact; normal gait; normal symmetric reflexes; normal extremity strength and sensation throughout Psychological: alert and cooperative; normal mood and affect  Allergies  Allergen Reactions  . Sulfa Antibiotics Anaphylaxis and Hives  . Sulfur Anaphylaxis  . Other Hives    Mayonnaise     Past Medical History:  Diagnosis Date  . Abnormal Pap smear    HPV  . Anemia   . Chlamydia   . Eczema   . Gallstones   . Infection    urinary tract infection  . No pertinent past medical history   . Obesity   . Sickle cell trait (HCC)    Social History   Socioeconomic History  . Marital status: Divorced    Spouse name: Not on file  . Number of children: Not on file  . Years of education: Not on file  . Highest education level: Not on file  Occupational History  . Not on file  Social Needs  . Financial resource strain: Not on file  . Food insecurity:    Worry: Not on file    Inability: Not on file  . Transportation needs:    Medical: Not on file    Non-medical: Not on file  Tobacco Use  . Smoking status: Current Some Day Smoker    Packs/day: 0.25    Years: 1.00    Pack years: 0.25    Types: Cigarettes    Last attempt to quit: 02/15/2013    Years since quitting: 4.7  . Smokeless tobacco: Never Used  . Tobacco comment: when she drinks alcohol  Substance and Sexual Activity  . Alcohol use: Yes    Comment: 1 bottle liquior on weekends  . Drug use: No  .  Sexual activity: Never    Partners: Male    Birth control/protection: Implant  Lifestyle  . Physical activity:    Days per week: Not on file    Minutes per session: Not on file  . Stress: Not on file  Relationships  . Social connections:    Talks on phone: Not on file    Gets together: Not on file    Attends religious service: Not on file    Active member of club or organization: Not on file    Attends meetings of clubs or  organizations: Not on file    Relationship status: Not on file  . Intimate partner violence:    Fear of current or ex partner: Not on file    Emotionally abused: Not on file    Physically abused: Not on file    Forced sexual activity: Not on file  Other Topics Concern  . Not on file  Social History Narrative  . Not on file   Family History  Problem Relation Age of Onset  . Hypertension Mother   . Heart disease Mother   . Hypertension Maternal Grandmother   . Heart disease Maternal Grandmother   . Cancer Maternal Aunt        leukemia  . Cancer Maternal Uncle        pancreatic  . Other Neg Hx   . Colon cancer Neg Hx   . Esophageal cancer Neg Hx    Past Surgical History:  Procedure Laterality Date  . APPENDECTOMY  2010  . CHOLECYSTECTOMY  2011     Mardella Layman, MD 11/08/17 [phone]

## 2017-11-13 ENCOUNTER — Encounter (HOSPITAL_COMMUNITY)
Admission: RE | Disposition: A | Discharge status: Home / Self Care | Payer: Self-pay | Source: Ambulatory Visit | Attending: Gastroenterology

## 2017-11-13 ENCOUNTER — Other Ambulatory Visit: Payer: Self-pay

## 2017-11-13 ENCOUNTER — Ambulatory Visit (HOSPITAL_COMMUNITY)
Admission: RE | Admit: 2017-11-13 | Discharge: 2017-11-13 | Disposition: A | Discharge status: Home / Self Care | Payer: Self-pay | Source: Ambulatory Visit | Attending: Gastroenterology | Admitting: Gastroenterology

## 2017-11-13 ENCOUNTER — Ambulatory Visit (HOSPITAL_COMMUNITY): Payer: Self-pay | Admitting: Anesthesiology

## 2017-11-13 ENCOUNTER — Encounter (HOSPITAL_COMMUNITY): Payer: Self-pay | Admitting: *Deleted

## 2017-11-13 DIAGNOSIS — Z6841 Body Mass Index (BMI) 40.0 and over, adult: Secondary | ICD-10-CM | POA: Insufficient documentation

## 2017-11-13 DIAGNOSIS — R0789 Other chest pain: Secondary | ICD-10-CM | POA: Insufficient documentation

## 2017-11-13 DIAGNOSIS — K269 Duodenal ulcer, unspecified as acute or chronic, without hemorrhage or perforation: Secondary | ICD-10-CM

## 2017-11-13 DIAGNOSIS — K2100 Gastro-esophageal reflux disease with esophagitis, without bleeding: Secondary | ICD-10-CM

## 2017-11-13 DIAGNOSIS — K2950 Unspecified chronic gastritis without bleeding: Secondary | ICD-10-CM | POA: Insufficient documentation

## 2017-11-13 DIAGNOSIS — R1013 Epigastric pain: Secondary | ICD-10-CM | POA: Insufficient documentation

## 2017-11-13 DIAGNOSIS — F17210 Nicotine dependence, cigarettes, uncomplicated: Secondary | ICD-10-CM | POA: Insufficient documentation

## 2017-11-13 DIAGNOSIS — K449 Diaphragmatic hernia without obstruction or gangrene: Secondary | ICD-10-CM

## 2017-11-13 HISTORY — PX: ESOPHAGOGASTRODUODENOSCOPY (EGD) WITH PROPOFOL: SHX5813

## 2017-11-13 HISTORY — PX: BIOPSY: SHX5522

## 2017-11-13 LAB — PREGNANCY, URINE: Preg Test, Ur: NEGATIVE

## 2017-11-13 SURGERY — ESOPHAGOGASTRODUODENOSCOPY (EGD) WITH PROPOFOL
Anesthesia: Monitor Anesthesia Care

## 2017-11-13 MED ORDER — OMEPRAZOLE 40 MG PO CPDR: 40.0000 mg | DELAYED_RELEASE_CAPSULE | Freq: Two times a day (BID) | ORAL | 3 refills | Status: DC

## 2017-11-13 MED ORDER — ONDANSETRON HCL 4 MG/2ML IJ SOLN
INTRAMUSCULAR | Status: DC | PRN
  Administered 2017-11-13: 4 mg via INTRAVENOUS

## 2017-11-13 MED ORDER — PROPOFOL 500 MG/50ML IV EMUL
INTRAVENOUS | Status: DC | PRN
  Administered 2017-11-13: 200 ug/kg/min via INTRAVENOUS

## 2017-11-13 MED ORDER — PROPOFOL 10 MG/ML IV BOLUS
INTRAVENOUS | Status: DC | PRN
  Administered 2017-11-13 (×2): 20 mg via INTRAVENOUS

## 2017-11-13 MED ORDER — LACTATED RINGERS IV SOLN
INTRAVENOUS | Status: DC
  Administered 2017-11-13: 1000 mL via INTRAVENOUS

## 2017-11-13 MED ORDER — PROPOFOL 10 MG/ML IV BOLUS
INTRAVENOUS | Status: AC
  Filled 2017-11-13: qty 40

## 2017-11-13 MED ORDER — PROPOFOL 10 MG/ML IV BOLUS
INTRAVENOUS | Status: AC
  Filled 2017-11-13: qty 20

## 2017-11-13 MED ORDER — LIDOCAINE 2% (20 MG/ML) 5 ML SYRINGE
INTRAMUSCULAR | Status: DC | PRN
  Administered 2017-11-13: 100 mg via INTRAVENOUS

## 2017-11-13 SURGICAL SUPPLY — 15 items

## 2017-11-13 NOTE — H&P (Signed)
HPI:   Bianca Cantrell is a 32 y.o. female with medical history as outlined below, here for EGD to further evaluate epigastric pain / reflux symptoms. She is s/p cholecystectomy. She states she has had some benefit with antacids but doesn't use them routinely. She has had multiple ED visits for this issue. EGD done at the hospital due to BMI.     Past Medical History:  Diagnosis Date  . Abnormal Pap smear    HPV  . Anemia   . Chlamydia   . Eczema   . Gallstones   . Infection    urinary tract infection  . No pertinent past medical history   . Obesity   . Sickle cell trait [facility])     Past Surgical History:  Procedure Laterality Date  . APPENDECTOMY  2010  . CHOLECYSTECTOMY  2011    Family History  Problem Relation Age of Onset  . Hypertension Mother   . Heart disease Mother   . Hypertension Maternal Grandmother   . Heart disease Maternal Grandmother   . Cancer Maternal Aunt        leukemia  . Cancer Maternal Uncle        pancreatic  . Other Neg Hx   . Colon cancer Neg Hx   . Esophageal cancer Neg Hx      Social History   Tobacco Use  . Smoking status: Current Some Day Smoker    Packs/day: 0.25    Years: 1.00    Pack years: 0.25    Types: Cigarettes    Last attempt to quit: 02/15/2013    Years since quitting: 4.7  . Smokeless tobacco: Never Used  . Tobacco comment: when she drinks alcohol  Substance Use Topics  . Alcohol use: Yes    Comment: 1 bottle liquior on weekends  . Drug use: No    Prior to Admission medications   Medication Sig Start Date End Date Taking? Authorizing Provider  Multiple Vitamins-Minerals (MULTIVITAMIN WITH MINERALS) tablet Take 1 tablet by mouth daily.   Yes [provider]  OVER THE COUNTER MEDICATION Take 1 tablet by mouth daily. IT WORKS - WEIGHT MANAGEMENT   Yes [provider]    Current Facility-Administered Medications  Medication Dose Route Frequency Provider Last Rate Last Dose  .  lactated ringers infusion   Intravenous Continuous Benancio Deeds, MD 10 mL/hr at 11/13/17 0722 1,000 mL at 11/13/17 0722    Allergies as of 09/07/2017 - Review Complete 09/07/2017  Allergen Reaction Noted  . Sulfa antibiotics Anaphylaxis and Hives 09/15/2010  . Sulfur Anaphylaxis 06/18/2011  . Other Hives 09/15/2010     Review of Systems:    As per HPI, otherwise negative    Physical Exam:  Vital signs in last 24 hours: Weight:  [131.1 kg] 131.1 kg (09/10 0715)   General:   Pleasant female in NAD Neck:  Supple Lungs:  Respirations even and unlabored. Lungs clear to auscultation bilaterally.   No wheezes, crackles, or rhonchi.  Heart:  Regular rate and rhythm; no MRG Abdomen:  Soft, nondistended, nontender.  No appreciable masses or hepatomegaly.  Neurologic:  Alert and  oriented x4;  grossly normal neurologically.  Lab Results  Component Value Date   WBC 16.9 (H) 05/17/2017   HGB 15.2 (H) 05/17/2017   HCT 44.7 05/17/2017   MCV 88.5 05/17/2017   PLT 277 05/17/2017    Lab Results  Component Value  Date   CREATININE 0.63 05/17/2017   BUN 12 05/17/2017   NA 139 05/17/2017   K 3.5 05/17/2017   CL 101 05/17/2017   CO2 23 05/17/2017    Lab Results  Component Value Date   ALT 50 05/17/2017   AST 38 05/17/2017   ALKPHOS 69 05/17/2017   BILITOT 0.5 05/17/2017      Impression / Plan:   32 y/o female with intermittent epigastric / lower chest pain in the setting of reflux symptoms, I suspect could be related. She does not like to take medications routinely for this but has responded to antacids. EGD offered to further evaluate. I have discussed risks / benefits and she wishes to proceed. Further recommendations pending the results.  Ileene Patrick, MD [facility]

## 2017-11-13 NOTE — Interval H&P Note (Signed)
History and Physical Interval Note:  11/13/2017 8:20 AM  Bianca Cantrell  has presented today for surgery, with the diagnosis of epigastric pain, obeasity/db  The various methods of treatment have been discussed with the patient and family. After consideration of risks, benefits and other options for treatment, the patient has consented to  Procedure(s): ESOPHAGOGASTRODUODENOSCOPY (EGD) WITH PROPOFOL (N/A) as a surgical intervention .  The patient's history has been reviewed, patient examined, no change in status, stable for surgery.  I have reviewed the patient's chart and labs.  Questions were answered to the patient's satisfaction.     Viviann Spare P Armbruster

## 2017-11-13 NOTE — Anesthesia Preprocedure Evaluation (Signed)
Anesthesia Evaluation  Patient identified by MRN, date of birth, ID band Patient awake    Reviewed: Allergy & Precautions, NPO status , Patient's Chart, lab work & pertinent test results  History of Anesthesia Complications Negative for: history of anesthetic complications  Airway Mallampati: II  TM Distance: >3 FB Neck ROM: Full    Dental  (+) Teeth Intact,    Pulmonary Current Smoker,    breath sounds clear to auscultation       Cardiovascular negative cardio ROS   Rhythm:Regular     Neuro/Psych negative neurological ROS  negative psych ROS   GI/Hepatic Neg liver ROS, Epigastric pain   Endo/Other  Morbid obesity  Renal/GU negative Renal ROS  negative genitourinary   Musculoskeletal   Abdominal   Peds negative pediatric ROS (+)  Hematology negative hematology ROS (+)   Anesthesia Other Findings   Reproductive/Obstetrics                             Anesthesia Physical Anesthesia Plan  ASA: II  Anesthesia Plan: MAC   Post-op Pain Management:    Induction: Intravenous  PONV Risk Score and Plan: 1 and Treatment may vary due to age or medical condition  Airway Management Planned: Nasal Cannula  Additional Equipment:   Intra-op Plan:   Post-operative Plan:   Informed Consent: I have reviewed the patients History and Physical, chart, labs and discussed the procedure including the risks, benefits and alternatives for the proposed anesthesia with the patient or authorized representative who has indicated his/her understanding and acceptance.   Dental advisory given  Plan Discussed with: CRNA and Surgeon  Anesthesia Plan Comments:         Anesthesia Quick Evaluation

## 2017-11-13 NOTE — Discharge Instructions (Signed)
Esophagogastroduodenoscopy, Care After °Refer to this sheet in the next few weeks. These instructions provide you with information about caring for yourself after your procedure. Your health care provider may also give you more specific instructions. Your treatment has been planned according to current medical practices, but problems sometimes occur. Call your health care provider if you have any problems or questions after your procedure. °What can I expect after the procedure? °After the procedure, it is common to have: °· A sore throat. °· Nausea. °· Bloating. °· Dizziness. °· Fatigue. ° °Follow these instructions at home: °· Do not eat or drink anything until the numbing medicine (local anesthetic) has worn off and your gag reflex has returned. You will know that the local anesthetic has worn off when you can swallow comfortably. °· Do not drive for 24 hours if you received a medicine to help you relax (sedative). °· If your health care provider took a tissue sample for testing during the procedure, make sure to get your test results. This is your responsibility. Ask your health care provider or the department performing the test when your results will be ready. °· Keep all follow-up visits as told by your health care provider. This is important. °Contact a health care provider if: °· You cannot stop coughing. °· You are not urinating. °· You are urinating less than usual. °Get help right away if: °· You have trouble swallowing. °· You cannot eat or drink. °· You have throat or chest pain that gets worse. °· You are dizzy or light-headed. °· You faint. °· You have nausea or vomiting. °· You have chills. °· You have a fever. °· You have severe abdominal pain. °· You have black, tarry, or bloody stools. °This information is not intended to replace advice given to you by your health care provider. Make sure you discuss any questions you have with your health care provider. °Document Released: 02/07/2012 Document  Revised: 07/29/2015 Document Reviewed: 01/14/2015 °Elsevier Interactive Patient Education © 2018 [employer] ° °

## 2017-11-13 NOTE — Anesthesia Procedure Notes (Signed)
Procedure Name: MAC Date/Time: 11/13/2017 8:16 AM Performed by: Maxwell Caul, CRNA Pre-anesthesia Checklist: Patient identified, Emergency Drugs available, Suction available and Patient being monitored Oxygen Delivery Method: Simple face mask

## 2017-11-13 NOTE — Anesthesia Postprocedure Evaluation (Signed)
Anesthesia Post Note  Patient: Bianca Cantrell  Procedure(s) Performed: ESOPHAGOGASTRODUODENOSCOPY (EGD) WITH PROPOFOL (N/A ) BIOPSY     Patient location during evaluation: PACU Anesthesia Type: MAC Level of consciousness: awake and alert Pain management: pain level controlled Vital Signs Assessment: post-procedure vital signs reviewed and stable Respiratory status: spontaneous breathing, nonlabored ventilation, respiratory function stable and patient connected to nasal cannula oxygen Cardiovascular status: stable and blood pressure returned to baseline Postop Assessment: no apparent nausea or vomiting Anesthetic complications: no    Last Vitals:  Vitals:   11/13/17 0855 11/13/17 0900  BP: 134/85 123/83  Pulse: 87 78  Resp: (!) 28 16  SpO2: 100% 95%    Last Pain:  Vitals:   11/13/17 0900  PainSc: 0-No pain                 Tiajuana Amass

## 2017-11-13 NOTE — Transfer of Care (Signed)
Immediate Anesthesia Transfer of Care Note  Patient: Bianca Cantrell  Procedure(s) Performed: ESOPHAGOGASTRODUODENOSCOPY (EGD) WITH PROPOFOL (N/A ) BIOPSY  Patient Location: PACU  Anesthesia Type:MAC  Level of Consciousness: awake, alert  and oriented  Airway & Oxygen Therapy: Patient Spontanous Breathing and Patient connected to face mask oxygen  Post-op Assessment: Report given to RN and Post -op Vital signs reviewed and stable  Post vital signs: Reviewed and stable  Last Vitals:  Vitals Value Taken Time  BP    Temp    Pulse    Resp    SpO2      Last Pain:  Vitals:   11/13/17 0715  PainSc: 0-No pain         Complications: No apparent anesthesia complications

## 2017-11-13 NOTE — Op Note (Signed)
[facility] Patient Name: Bianca Cantrell Procedure Date: 11/13/2017 MRN: [id number] Attending MD: Carlota Raspberry. Havery Moros , MD Date of Birth: 01-24-1986 CSN: [id number] Age: 32 Admit Type: Outpatient Procedure:                Upper GI endoscopy Indications:              Epigastric abdominal pain, Suspected esophageal                            reflux - patient endorses some improvement with                            Zantac in the past but has not taken routinely. Providers:                Carlota Raspberry. Havery Moros, MD, Angus Seller, Elspeth Cho Tech., Technician, Virgia Land, CRNA Referring MD:              Medicines:                Monitored Anesthesia Care Complications:            No immediate complications. Estimated blood loss:                            Minimal. Estimated Blood Loss:     Estimated blood loss was minimal. Procedure:                Pre-Anesthesia Assessment:                           - Prior to the procedure, a History and Physical                            was performed, and patient medications and                            allergies were reviewed. The patient's tolerance of                            previous anesthesia was also reviewed. The risks                            and benefits of the procedure and the sedation                            options and risks were discussed with the patient.                            All questions were answered, and informed consent                            was obtained. Prior Anticoagulants: The patient has  taken no previous anticoagulant or antiplatelet                            agents. ASA Grade Assessment: III - A patient with                            severe systemic disease. After reviewing the risks                            and benefits, the patient was deemed in                            satisfactory condition to undergo the procedure.                         After obtaining informed consent, the endoscope was                            passed under direct vision. Throughout the                            procedure, the patient's blood pressure, pulse, and                            oxygen saturations were monitored continuously. The                            [id number]-H190 ([id number]) Olympus adult endoscope was                            introduced through the mouth, and advanced to the                            second part of duodenum. The upper GI endoscopy was                            accomplished without difficulty. The patient                            tolerated the procedure well. Scope In: Scope Out: Findings:      Esophagogastric landmarks were identified: the Z-line was found at 33       cm, the gastroesophageal junction was found at 33 cm and the upper       extent of the gastric folds was found at 35 cm from the incisors.      A 2 cm hiatal hernia was present.      Mild esophagitis was found 33 cm from the incisors.      The exam of the esophagus was otherwise normal.      A small amount of food was found in the gastric body, limited views only       in this small focal area.      The exam of the stomach was otherwise normal.      Biopsies were taken with a cold forceps in the gastric body, at the  incisura and in the gastric antrum for Helicobacter pylori testing.      A few erosions were found in the duodenal bulb.      The exam of the duodenum was otherwise normal. Impression:               - Esophagogastric landmarks identified.                           - 2 cm hiatal hernia.                           - Reflux esophagitis.                           - Normal esophagus otherwise                           - A small amount of food in the stomach.                           - Otherwise normal stomach - biopsies taken to rule                            out H pylori                           - A few  duodenal erosions.                           - Normal duodenum otherwise.                           Overall, suspect reflux is driving most of the                            patient's symptoms. Lower chest / proximal                            epigastric pain may be due to esophagitis. Moderate Sedation:      No moderate sedation, case performed with MAC Recommendation:           - Patient has a contact number available for                            emergencies. The signs and symptoms of potential                            delayed complications were discussed with the                            patient. Return to normal activities tomorrow.                            Written discharge instructions were provided to the  patient.                           - Resume previous diet.                           - Continue present medications.                           - Trial of high dose PPI - omeprazole 35m BID or                            pantoprazole 454mBID for 1 month, then taper down,                            will discuss options with patient                           - Await pathology results. Procedure Code(s):        --- Professional ---                           [id number]py, flexible,                            transoral; with biopsy, single or multiple Diagnosis Code(s):        --- Professional ---                           K44.9, Diaphragmatic hernia without obstruction or                            gangrene                           K21.0, Gastro-esophageal reflux disease with                            esophagitis                           K26.9, Duodenal ulcer, unspecified as acute or                            chronic, without hemorrhage or perforation                           R10.13, Epigastric pain CPT copyright 2017 American Medical Association. All rights reserved. The codes documented in this report are preliminary  and upon coder review may  be revised to meet current compliance requirements. StRemo Lipps. , MD 11/13/2017 8:41:38 AM This report has been signed electronically. Number of Addenda: 0

## 2017-11-14 ENCOUNTER — Encounter: Payer: Self-pay | Admitting: Gastroenterology

## 2017-11-15 ENCOUNTER — Encounter (HOSPITAL_COMMUNITY): Payer: Self-pay | Admitting: Gastroenterology

## 2018-01-03 ENCOUNTER — Encounter (HOSPITAL_COMMUNITY): Payer: Self-pay | Admitting: *Deleted

## 2018-01-03 ENCOUNTER — Inpatient Hospital Stay (HOSPITAL_COMMUNITY)
Admission: AD | Admit: 2018-01-03 | Discharge: 2018-01-03 | Disposition: A | Discharge status: Home / Self Care | Payer: Managed Care, Other (non HMO) | Source: Ambulatory Visit | Attending: Obstetrics & Gynecology | Admitting: Obstetrics & Gynecology

## 2018-01-03 DIAGNOSIS — D573 Sickle-cell trait: Secondary | ICD-10-CM | POA: Diagnosis not present

## 2018-01-03 DIAGNOSIS — F17210 Nicotine dependence, cigarettes, uncomplicated: Secondary | ICD-10-CM | POA: Insufficient documentation

## 2018-01-03 DIAGNOSIS — K219 Gastro-esophageal reflux disease without esophagitis: Secondary | ICD-10-CM | POA: Insufficient documentation

## 2018-01-03 DIAGNOSIS — R079 Chest pain, unspecified: Secondary | ICD-10-CM | POA: Diagnosis present

## 2018-01-03 DIAGNOSIS — R0789 Other chest pain: Secondary | ICD-10-CM | POA: Diagnosis not present

## 2018-01-03 DIAGNOSIS — Z6841 Body Mass Index (BMI) 40.0 and over, adult: Secondary | ICD-10-CM | POA: Insufficient documentation

## 2018-01-03 DIAGNOSIS — Z8249 Family history of ischemic heart disease and other diseases of the circulatory system: Secondary | ICD-10-CM | POA: Insufficient documentation

## 2018-01-03 DIAGNOSIS — Z882 Allergy status to sulfonamides status: Secondary | ICD-10-CM | POA: Diagnosis not present

## 2018-01-03 DIAGNOSIS — Z9049 Acquired absence of other specified parts of digestive tract: Secondary | ICD-10-CM | POA: Diagnosis not present

## 2018-01-03 DIAGNOSIS — Z87440 Personal history of urinary (tract) infections: Secondary | ICD-10-CM | POA: Insufficient documentation

## 2018-01-03 DIAGNOSIS — E669 Obesity, unspecified: Secondary | ICD-10-CM | POA: Diagnosis not present

## 2018-01-03 DIAGNOSIS — Z91018 Allergy to other foods: Secondary | ICD-10-CM | POA: Insufficient documentation

## 2018-01-03 DIAGNOSIS — Z79899 Other long term (current) drug therapy: Secondary | ICD-10-CM | POA: Insufficient documentation

## 2018-01-03 DIAGNOSIS — R102 Pelvic and perineal pain: Secondary | ICD-10-CM | POA: Diagnosis not present

## 2018-01-03 DIAGNOSIS — N949 Unspecified condition associated with female genital organs and menstrual cycle: Secondary | ICD-10-CM

## 2018-01-03 LAB — URINALYSIS, ROUTINE W REFLEX MICROSCOPIC
BILIRUBIN URINE: NEGATIVE
GLUCOSE, UA: NEGATIVE mg/dL
HGB URINE DIPSTICK: NEGATIVE
KETONES UR: NEGATIVE mg/dL
Leukocytes, UA: NEGATIVE
Nitrite: NEGATIVE
PH: 7 (ref 5.0–8.0)
Protein, ur: NEGATIVE mg/dL
SPECIFIC GRAVITY, URINE: 1.012 (ref 1.005–1.030)

## 2018-01-03 LAB — POCT PREGNANCY, URINE: Preg Test, Ur: NEGATIVE

## 2018-01-03 NOTE — MAU Note (Signed)
Pt presents to MAU c/o chest pain x few days. Pt states on the way to work tonight her left arm started hurting. Pt states she has anxiety and wanted to make sure she was okay because her boss just died of chest pain. Pt also reports vaginal hardness pt states at the bottom of her vaginal it almost "looks like a tongue" she states she is not sexually active and is about to start her period. Pt also reports a slight HA.

## 2018-01-03 NOTE — MAU Provider Note (Addendum)
History     CSN: [id number]  Arrival date and time: 01/03/18 2056   First Provider Initiated Contact with Patient 01/03/18 2132      Chief Complaint  Patient presents with  . Headache  . Chest Pain  . Other    vaginal hardness    Bianca Cantrell presents for chest pain, left arm pain, HA and "vagina hard" Chest pain  - started a few days ago - stated that pain is in the center of her chest - pain is constant - she exercises with "dance the pounds off" and does not have pain during this activity - no personal history of heart disease, diabetes or HTN (not even during pregnancy) - family history of heart disease in grandfather who died of heart attack in 27s - left arm pain started earlier today - reports that she is left handed and has carpal tunnel on the left, but pain is usually limited to the hand and wrist - this pain is going all the way up the arm to the shoulder - has a lot of anxiety and sometimes has panic attacks with heart palpitations and sweating - concerned that she is having a heart attack  - also has acid reflux that feels very similar to the symptoms she's having now  Vaginal discomfort - states that she feels her vagina is hard - has been doing lots of kegel exercise recently - this morning felt like there was something hard in her vagina that wasn't there before - denies discharge, burning, itching, rashes - states that she has not been sexually active in about a year - concerned that there's something down there that she can't see     Past Medical History:  Diagnosis Date  . Abnormal Pap smear    HPV  . Anemia   . Chlamydia   . Eczema   . Gallstones   . Infection    urinary tract infection  . No pertinent past medical history   . Obesity   . Sickle cell trait [facility])     Past Surgical History:  Procedure Laterality Date  . APPENDECTOMY  2010  . BIOPSY  11/13/2017   Procedure: BIOPSY;  Surgeon: Benancio Deeds, MD;  Location: [facility];   Service: Gastroenterology;;  . [name]  . ESOPHAGOGASTRODUODENOSCOPY (EGD) WITH PROPOFOL N/A 11/13/2017   Procedure: ESOPHAGOGASTRODUODENOSCOPY (EGD) WITH PROPOFOL;  Surgeon: Benancio Deeds, MD;  Location: [facility];  Service: Gastroenterology;  Laterality: N/A;    Family History  Problem Relation Age of Onset  . Hypertension Mother   . Heart disease Mother   . Hypertension Maternal Grandmother   . Heart disease Maternal Grandmother   . Cancer Maternal Aunt        leukemia  . Cancer Maternal Uncle        pancreatic  . Other Neg Hx   . Colon cancer Neg Hx   . Esophageal cancer Neg Hx     Social History   Tobacco Use  . Smoking status: Current Some Day Smoker    Packs/day: 0.25    Years: 1.00    Pack years: 0.25    Types: Cigarettes    Last attempt to quit: 02/15/2013    Years since quitting: 4.8  . Smokeless tobacco: Never Used  . Tobacco comment: when she drinks alcohol  Substance Use Topics  . Alcohol use: Yes    Comment: 1 bottle liquior on weekends  . Drug use: No    Allergies:  Allergies  Allergen Reactions  . Sulfa Antibiotics Anaphylaxis and Hives  . Sulfur Anaphylaxis  . Other Hives    Mayonnaise     Medications Prior to Admission  Medication Sig Dispense Refill Last Dose  . Multiple Vitamins-Minerals (MULTIVITAMIN WITH MINERALS) tablet Take 1 tablet by mouth daily.   01/03/2018 at Unknown time  . omeprazole (PRILOSEC) 40 MG capsule Take 1 capsule (40 mg total) by mouth 2 (two) times daily. 60 capsule 3 01/03/2018 at Unknown time  . OVER THE COUNTER MEDICATION Take 1 tablet by mouth daily. IT WORKS - WEIGHT MANAGEMENT   Unknown at Unknown time    Review of Systems  All other systems reviewed and are negative.  Physical Exam   Blood pressure 122/79, pulse 87, temperature 98.4 F (36.9 C), temperature source Oral, resp. rate 19, height 4\' 10"  (1.473 m), weight (!) 137.4 kg.  Physical Exam  Constitutional: She is oriented to  person, place, and time. She appears well-developed and well-nourished. No distress.  HENT:  Head: Normocephalic and atraumatic.  Right Ear: External ear normal.  Left Ear: External ear normal.  Eyes: Pupils are equal, round, and reactive to light. Right eye exhibits no discharge. Left eye exhibits no discharge. No scleral icterus.  Cardiovascular: Normal rate, regular rhythm and normal heart sounds. Exam reveals no gallop and no friction rub.  No murmur heard. Respiratory: Effort normal and breath sounds normal. She has no wheezes. She has no rales.  Genitourinary: Vagina normal and uterus normal. No vaginal discharge found.  Genitourinary Comments: Speculum and bimanual exams WNL  Musculoskeletal: She exhibits no edema.  Neurological: She is alert and oriented to person, place, and time. No cranial nerve deficit.  Skin: Skin is warm and dry. No rash noted. She is not diaphoretic. No erythema.  Psychiatric: She has a normal mood and affect. Her behavior is normal. Judgment and thought content normal.    MAU Course  Procedures  MDM ECG with normal sinus rhythm  Speculum and bimanual exams WNL  Assessment and Plan  Atypical chest pain - Plan: Discharge patient - patient age, symptoms and ECG reassuring that not likely cardiac origin - discussed s/s of cardiac chest pain - return precautions given - encouraged to follow up with PCP or PRN   Vaginal discomfort - Plan: Discharge patient - pt declines STI testing  - normal exam without any masses  - encouraged to continue regular health maintenance  - follow up PRN    Bianca Cantrell 01/03/2018, 9:59 PM

## 2018-01-04 ENCOUNTER — Other Ambulatory Visit: Payer: Self-pay | Admitting: Gastroenterology

## 2018-01-29 ENCOUNTER — Ambulatory Visit: Payer: Managed Care, Other (non HMO) | Admitting: Internal Medicine

## 2018-03-12 ENCOUNTER — Other Ambulatory Visit: Payer: Self-pay | Admitting: Obstetrics

## 2018-03-12 ENCOUNTER — Telehealth: Payer: Self-pay | Admitting: *Deleted

## 2018-03-12 DIAGNOSIS — A6000 Herpesviral infection of urogenital system, unspecified: Secondary | ICD-10-CM

## 2018-03-12 MED ORDER — VALACYCLOVIR HCL 1 G PO TABS: 1000.0000 mg | ORAL_TABLET | Freq: Two times a day (BID) | ORAL | 5 refills | Status: DC

## 2018-03-12 NOTE — Telephone Encounter (Signed)
Pt called office for refill on HSV Rx. Pt states she may need appt.  Spoke with pt regarding Rx.  Message to be sent to provider for refill request since not initiated in our office. Pt also made appt for gyn exam, ?recurrent BV, vaginal irritation.    Please send Rx-Valtrex if approved. Pt uses [facility] on [address] If not, pt is scheduled on 03/18/2018 for gyn visit.

## 2018-03-12 NOTE — Telephone Encounter (Signed)
Valtrex Rx 

## 2018-03-18 ENCOUNTER — Encounter: Payer: Self-pay | Admitting: Obstetrics

## 2018-03-18 ENCOUNTER — Ambulatory Visit (INDEPENDENT_AMBULATORY_CARE_PROVIDER_SITE_OTHER): Payer: Self-pay | Admitting: Obstetrics

## 2018-03-18 VITALS — BP 122/83 | HR 90 | Wt 294.8 lb

## 2018-03-18 DIAGNOSIS — Z6841 Body Mass Index (BMI) 40.0 and over, adult: Secondary | ICD-10-CM

## 2018-03-18 DIAGNOSIS — B9689 Other specified bacterial agents as the cause of diseases classified elsewhere: Secondary | ICD-10-CM

## 2018-03-18 DIAGNOSIS — Z113 Encounter for screening for infections with a predominantly sexual mode of transmission: Secondary | ICD-10-CM

## 2018-03-18 DIAGNOSIS — N898 Other specified noninflammatory disorders of vagina: Secondary | ICD-10-CM

## 2018-03-18 DIAGNOSIS — A630 Anogenital (venereal) warts: Secondary | ICD-10-CM

## 2018-03-18 DIAGNOSIS — E66813 Obesity, class 3: Secondary | ICD-10-CM

## 2018-03-18 DIAGNOSIS — A6000 Herpesviral infection of urogenital system, unspecified: Secondary | ICD-10-CM

## 2018-03-18 DIAGNOSIS — N760 Acute vaginitis: Secondary | ICD-10-CM

## 2018-03-18 MED ORDER — VALACYCLOVIR HCL 1 G PO TABS: 1000.0000 mg | ORAL_TABLET | Freq: Every day | ORAL | 11 refills | Status: DC

## 2018-03-18 MED ORDER — TINIDAZOLE 500 MG PO TABS: 1000.0000 mg | ORAL_TABLET | Freq: Every day | ORAL | 6 refills | Status: DC

## 2018-03-18 NOTE — Progress Notes (Signed)
Pt c/o recurrent BV infections. Pt wants wart removed. Negative pap 09/2017. Pt abstinent; no BC desired at this time per pt.

## 2018-03-18 NOTE — Progress Notes (Signed)
Patient ID: Bianca Cantrell, female   DOB: [birth year], 33 y.o.   MRN: [id number]  Chief Complaint  Patient presents with  . Genital Warts    vaginal discharge     HPI Bianca Cantrell is a 33 y.o. female.  Recurrent BV.  Has small genital wart that she wants removed. HPI  Past Medical History:  Diagnosis Date  . Abnormal Pap smear    HPV  . Anemia   . Chlamydia   . Eczema   . Gallstones   . Infection    urinary tract infection  . No pertinent past medical history   . Obesity   . Sickle cell trait [facility])     Past Surgical History:  Procedure Laterality Date  . APPENDECTOMY  2010  . BIOPSY  11/13/2017   Procedure: BIOPSY;  Surgeon: Benancio Deeds, MD;  Location: [facility];  Service: Gastroenterology;;  . [name]  . ESOPHAGOGASTRODUODENOSCOPY (EGD) WITH PROPOFOL N/A 11/13/2017   Procedure: ESOPHAGOGASTRODUODENOSCOPY (EGD) WITH PROPOFOL;  Surgeon: Benancio Deeds, MD;  Location: [facility];  Service: Gastroenterology;  Laterality: N/A;    Family History  Problem Relation Age of Onset  . Hypertension Mother   . Heart disease Mother   . Hypertension Maternal Grandmother   . Heart disease Maternal Grandmother   . Cancer Maternal Aunt        leukemia  . Cancer Maternal Uncle        pancreatic  . Other Neg Hx   . Colon cancer Neg Hx   . Esophageal cancer Neg Hx     Social History Social History   Tobacco Use  . Smoking status: Current Some Day Smoker    Packs/day: 0.25    Years: 1.00    Pack years: 0.25    Types: Cigarettes  . Smokeless tobacco: Never Used  Substance Use Topics  . Alcohol use: Yes    Comment: 1 bottle liquior on weekends  . Drug use: No    Allergies  Allergen Reactions  . Sulfa Antibiotics Anaphylaxis and Hives  . Sulfur Anaphylaxis  . Other Hives    Mayonnaise     Current Outpatient Medications  Medication Sig Dispense Refill  . Multiple Vitamins-Minerals (MULTIVITAMIN WITH MINERALS) tablet Take 1 tablet by  mouth daily.    Marland Kitchen omeprazole (PRILOSEC) 40 MG capsule Take 1 capsule (40 mg total) by mouth 2 (two) times daily. 60 capsule 3  . OVER THE COUNTER MEDICATION Take 1 tablet by mouth daily. IT WORKS - WEIGHT MANAGEMENT    . valACYclovir (VALTREX) 1000 MG tablet Take 1 tablet (1,000 mg total) by mouth daily. Take for 3 days prn for each outbreak. 30 tablet 11  . tinidazole (TINDAMAX) 500 MG tablet Take 2 tablets (1,000 mg total) by mouth daily with breakfast. Repeat monthly after the period for 6 months. 10 tablet 6   No current facility-administered medications for this visit.     Review of Systems Review of Systems Constitutional: negative for fatigue and weight loss Respiratory: negative for cough and wheezing Cardiovascular: negative for chest pain, fatigue and palpitations Gastrointestinal: negative for abdominal pain and change in bowel habits Genitourinary:negative Integument/breast: negative for nipple discharge Musculoskeletal:negative for myalgias Neurological: negative for gait problems and tremors Behavioral/Psych: negative for abusive relationship, depression Endocrine: negative for temperature intolerance      Blood pressure 122/83, pulse 90, weight 294 lb 12.8 oz (133.7 kg).  Physical Exam Physical Exam General:   alert  Skin:  no rash or abnormalities  Lungs:   clear to auscultation bilaterally  Heart:   regular rate and rhythm, S1, S2 normal, no murmur, click, rub or gallop  Breasts:   normal without suspicious masses, skin or nipple changes or axillary nodes  Abdomen:  normal findings: no organomegaly, soft, non-tender and no hernia  Pelvis:  External genitalia: normal general appearance Urinary system: urethral meatus normal and bladder without fullness, nontender Vaginal: normal without tenderness, induration or masses Cervix: normal appearance Adnexa: normal bimanual exam Uterus: anteverted and non-tender, normal size    50% of 20 min visit spent on counseling  and coordination of care.   Data Reviewed Genital Cultures Wet Preps  Assessment     1. BV (bacterial vaginosis) Rx: - tinidazole (TINDAMAX) 500 MG tablet; Take 2 tablets (1,000 mg total) by mouth daily with breakfast. Repeat monthly after the period for 6 months.  Dispense: 10 tablet; Refill: 6  2. Genital herpes simplex type 2 Rx: - valACYclovir (VALTREX) 1000 MG tablet; Take 1 tablet (1,000 mg total) by mouth daily. Take for 3 days prn for each outbreak.  Dispense: 30 tablet; Refill: 11  3. Genital warts - TCA 80% applied to single wart o right inner - upper thigh  4. Vaginal discharge Rx: - Cervicovaginal ancillary only( [place])  5. Class 3 severe obesity due to excess calories without serious comorbidity with body mass index (BMI) of 60.0 to 69.9 in adult [facility]) - program of caloric reduction, exercise and behavioral modification recommended    Plan     Instructed to treat to treat BV monthly after period with Tinidazole  Instructed to call office if genital wart not resolved in 2- 3 weeks Follow up in 7 months for Annual and BV follow up  No orders of the defined types were placed in this encounter.  Meds ordered this encounter  Medications  . valACYclovir (VALTREX) 1000 MG tablet    Sig: Take 1 tablet (1,000 mg total) by mouth daily. Take for 3 days prn for each outbreak.    Dispense:  30 tablet    Refill:  11  . tinidazole (TINDAMAX) 500 MG tablet    Sig: Take 2 tablets (1,000 mg total) by mouth daily with breakfast. Repeat monthly after the period for 6 months.    Dispense:  10 tablet    Refill:  6      Brock Bad MD 03-18-2018

## 2018-03-18 NOTE — Patient Instructions (Signed)
Bacterial Vaginosis    Bacterial vaginosis is a vaginal infection that occurs when the normal balance of bacteria in the vagina is disrupted. It results from an overgrowth of certain bacteria. This is the most common vaginal infection among women ages 15-44.  Because bacterial vaginosis increases your risk for STIs (sexually transmitted infections), getting treated can help reduce your risk for chlamydia, gonorrhea, herpes, and HIV (human immunodeficiency virus). Treatment is also important for preventing complications in pregnant women, because this condition can cause an early (premature) delivery.  What are the causes?  This condition is caused by an increase in harmful bacteria that are normally present in small amounts in the vagina. However, the reason that the condition develops is not fully understood.  What increases the risk?  The following factors may make you more likely to develop this condition:  · Having a new sexual partner or multiple sexual partners.  · Having unprotected sex.  · Douching.  · Having an intrauterine device (IUD).  · Smoking.  · Drug and alcohol abuse.  · Taking certain antibiotic medicines.  · Being pregnant.  You cannot get bacterial vaginosis from toilet seats, bedding, swimming pools, or contact with objects around you.  What are the signs or symptoms?  Symptoms of this condition include:  · Grey or white vaginal discharge. The discharge can also be watery or foamy.  · A fish-like odor with discharge, especially after sexual intercourse or during menstruation.  · Itching in and around the vagina.  · Burning or pain with urination.  Some women with bacterial vaginosis have no signs or symptoms.  How is this diagnosed?  This condition is diagnosed based on:  · Your medical history.  · A physical exam of the vagina.  · Testing a sample of vaginal fluid under a microscope to look for a large amount of bad bacteria or abnormal cells. Your health care provider may use a cotton swab or  a small wooden spatula to collect the sample.  How is this treated?  This condition is treated with antibiotics. These may be given as a pill, a vaginal cream, or a medicine that is put into the vagina (suppository). If the condition comes back after treatment, a second round of antibiotics may be needed.  Follow these instructions at home:  Medicines  · Take over-the-counter and prescription medicines only as told by your health care provider.  · Take or use your antibiotic as told by your health care provider. Do not stop taking or using the antibiotic even if you start to feel better.  General instructions  · If you have a female sexual partner, tell her that you have a vaginal infection. She should see her health care provider and be treated if she has symptoms. If you have a female sexual partner, he does not need treatment.  · During treatment:  ? Avoid sexual activity until you finish treatment.  ? Do not douche.  ? Avoid alcohol as directed by your health care provider.  ? Avoid breastfeeding as directed by your health care provider.  · Drink enough water and fluids to keep your urine clear or pale yellow.  · Keep the area around your vagina and rectum clean.  ? Wash the area daily with warm water.  ? Wipe yourself from front to back after using the toilet.  · Keep all follow-up visits as told by your health care provider. This is important.  How is this prevented?  · Do not   douche.  · Wash the outside of your vagina with warm water only.  · Use protection when having sex. This includes latex condoms and dental dams.  · Limit how many sexual partners you have. To help prevent bacterial vaginosis, it is best to have sex with just one partner (monogamous).  · Make sure you and your sexual partner are tested for STIs.  · Wear cotton or cotton-lined underwear.  · Avoid wearing tight pants and pantyhose, especially during summer.  · Limit the amount of alcohol that you drink.  · Do not use any products that contain  nicotine or tobacco, such as cigarettes and e-cigarettes. If you need help quitting, ask your health care provider.  · Do not use illegal drugs.  Where to find more information  · Centers for Disease Control and Prevention: www.cdc.gov/std  · American Sexual Health Association (ASHA): www.ashastd.org  · U.S. Department of Health and Human Services, Office on Women's Health: www.womenshealth.gov/ or https://www.womenshealth.gov/a-z-topics/bacterial-vaginosis  Contact a health care provider if:  · Your symptoms do not improve, even after treatment.  · You have more discharge or pain when urinating.  · You have a fever.  · You have pain in your abdomen.  · You have pain during sex.  · You have vaginal bleeding between periods.  Summary  · Bacterial vaginosis is a vaginal infection that occurs when the normal balance of bacteria in the vagina is disrupted.  · Because bacterial vaginosis increases your risk for STIs (sexually transmitted infections), getting treated can help reduce your risk for chlamydia, gonorrhea, herpes, and HIV (human immunodeficiency virus). Treatment is also important for preventing complications in pregnant women, because the condition can cause an early (premature) delivery.  · This condition is treated with antibiotic medicines. These may be given as a pill, a vaginal cream, or a medicine that is put into the vagina (suppository).  This information is not intended to replace advice given to you by your health care provider. Make sure you discuss any questions you have with your health care provider.  Document Released: 02/20/2005 Document Revised: 06/26/2016 Document Reviewed: 11/06/2015  Elsevier Interactive Patient Education © 2019 [employer]

## 2018-03-19 ENCOUNTER — Other Ambulatory Visit: Payer: Self-pay | Admitting: Obstetrics

## 2018-03-19 LAB — CERVICOVAGINAL ANCILLARY ONLY
Bacterial vaginitis: POSITIVE — AB
Candida vaginitis: NEGATIVE
Chlamydia: NEGATIVE
Neisseria Gonorrhea: NEGATIVE
Trichomonas: NEGATIVE

## 2018-04-09 ENCOUNTER — Encounter: Payer: Self-pay | Admitting: Gastroenterology

## 2018-04-16 ENCOUNTER — Ambulatory Visit: Payer: Managed Care, Other (non HMO) | Attending: Family Medicine | Admitting: Family Medicine

## 2018-04-16 ENCOUNTER — Encounter: Payer: Self-pay | Admitting: Family Medicine

## 2018-04-16 VITALS — BP 122/84 | HR 78 | Temp 98.7°F | Ht <= 58 in | Wt 290.0 lb

## 2018-04-16 DIAGNOSIS — K219 Gastro-esophageal reflux disease without esophagitis: Secondary | ICD-10-CM | POA: Insufficient documentation

## 2018-04-16 DIAGNOSIS — J309 Allergic rhinitis, unspecified: Secondary | ICD-10-CM

## 2018-04-16 DIAGNOSIS — D573 Sickle-cell trait: Secondary | ICD-10-CM | POA: Insufficient documentation

## 2018-04-16 DIAGNOSIS — E669 Obesity, unspecified: Secondary | ICD-10-CM | POA: Insufficient documentation

## 2018-04-16 DIAGNOSIS — F17210 Nicotine dependence, cigarettes, uncomplicated: Secondary | ICD-10-CM | POA: Insufficient documentation

## 2018-04-16 DIAGNOSIS — K644 Residual hemorrhoidal skin tags: Secondary | ICD-10-CM

## 2018-04-16 DIAGNOSIS — Z8249 Family history of ischemic heart disease and other diseases of the circulatory system: Secondary | ICD-10-CM | POA: Insufficient documentation

## 2018-04-16 DIAGNOSIS — Z6841 Body Mass Index (BMI) 40.0 and over, adult: Secondary | ICD-10-CM | POA: Insufficient documentation

## 2018-04-16 DIAGNOSIS — Z882 Allergy status to sulfonamides status: Secondary | ICD-10-CM | POA: Insufficient documentation

## 2018-04-16 DIAGNOSIS — Z79899 Other long term (current) drug therapy: Secondary | ICD-10-CM | POA: Insufficient documentation

## 2018-04-16 DIAGNOSIS — J029 Acute pharyngitis, unspecified: Secondary | ICD-10-CM

## 2018-04-16 MED ORDER — HYDROCORTISONE 2.5 % RE CREA: 1.0000 "application " | TOPICAL_CREAM | Freq: Two times a day (BID) | RECTAL | 3 refills | Status: DC

## 2018-04-16 MED ORDER — CETIRIZINE HCL 10 MG PO TABS: 10.0000 mg | ORAL_TABLET | Freq: Every day | ORAL | 11 refills | Status: DC

## 2018-04-16 NOTE — Progress Notes (Signed)
Patient complaining of throat discomfort, says it comes and goes. Patient also thinks that she has a hemorrhoid.

## 2018-04-16 NOTE — Patient Instructions (Signed)
Pharyngitis  Pharyngitis is a sore throat (pharynx). This is when there is redness, pain, and swelling in your throat. Most of the time, this condition gets better on its own. In some cases, you may need medicine. Follow these instructions at home:  Take over-the-counter and prescription medicines only as told by your doctor. ? If you were prescribed an antibiotic medicine, take it as told by your doctor. Do not stop taking the antibiotic even if you start to feel better. ? Do not give children aspirin. Aspirin has been linked to Reye syndrome.  Drink enough water and fluids to keep your pee (urine) clear or pale yellow.  Get a lot of rest.  Rinse your mouth (gargle) with a salt-water mixture 3-4 times a day or as needed. To make a salt-water mixture, completely dissolve -1 tsp of salt in 1 cup of warm water.  If your doctor approves, you may use throat lozenges or sprays to soothe your throat. Contact a doctor if:  You have large, tender lumps in your neck.  You have a rash.  You cough up green, yellow-brown, or bloody spit. Get help right away if:  You have a stiff neck.  You drool or cannot swallow liquids.  You cannot drink or take medicines without throwing up.  You have very bad pain that does not go away with medicine.  You have problems breathing, and it is not from a stuffy nose.  You have new pain and swelling in your knees, ankles, wrists, or elbows. Summary  Pharyngitis is a sore throat (pharynx). This is when there is redness, pain, and swelling in your throat.  If you were prescribed an antibiotic medicine, take it as told by your doctor. Do not stop taking the antibiotic even if you start to feel better.  Most of the time, pharyngitis gets better on its own. Sometimes, you may need medicine. This information is not intended to replace advice given to you by your health care provider. Make sure you discuss any questions you have with your health care  provider. Document Released: 08/09/2007 Document Revised: 03/28/2016 Document Reviewed: 03/28/2016 Elsevier Interactive Patient Education  2019 Elsevier Inc.  

## 2018-04-16 NOTE — Progress Notes (Signed)
Subjective:    Patient ID: Bianca Cantrell, female    DOB: 1985-07-02, 33 y.o.   MRN: [id number]  HPI       33 year old female new to me as a patient who presents secondary to the complaint of recurrent sore throat especially in the mornings for the past 3 to 4 weeks.  Patient also with complaint of possible hemorrhoid if she has had some rectal itching and felt a small soft lump in the rectal area.  Patient denies abdominal pain, no constipation, no blood in the stools or black stools.  Patient's only symptom has been the itching which started over the past few days.       Patient denies any difficulty swallowing associated with her sore throat.  Patient states that the sore throat is kind of an aching burning sensation that is about a 3-4 on a 0-to-10 scale.  Patient does have acid reflux but has been taking medication and patient has also been on a new diet for the past month and now that she is only drinking water and having mostly fruits and vegetables in her diet that her reflux symptoms have decreased.  Patient does have some occasional nasal congestion but states that she does not really feel as if she is having postnasal drainage.  Patient did have cold-like symptoms about 2 weeks ago such as nasal congestion postnasal drainage but she took more vitamin C and other natural supplements and felt better.  Past Medical History:  Diagnosis Date  . Abnormal Pap smear    HPV  . Anemia   . Chlamydia   . Eczema   . Gallstones   . Infection    urinary tract infection  . No pertinent past medical history   . Obesity   . Sickle cell trait [facility])    Past Surgical History:  Procedure Laterality Date  . APPENDECTOMY  2010  . BIOPSY  11/13/2017   Procedure: BIOPSY;  Surgeon: Benancio Deeds, MD;  Location: [facility];  Service: Gastroenterology;;  . [name]  . ESOPHAGOGASTRODUODENOSCOPY (EGD) WITH PROPOFOL N/A 11/13/2017   Procedure: ESOPHAGOGASTRODUODENOSCOPY (EGD) WITH  PROPOFOL;  Surgeon: Benancio Deeds, MD;  Location: [facility];  Service: Gastroenterology;  Laterality: N/A;   Family History  Problem Relation Age of Onset  . Hypertension Mother   . Heart disease Mother   . Hypertension Maternal Grandmother   . Heart disease Maternal Grandmother   . Cancer Maternal Aunt        leukemia  . Cancer Maternal Uncle        pancreatic  . Other Neg Hx   . Colon cancer Neg Hx   . Esophageal cancer Neg Hx    Social History   Tobacco Use  . Smoking status: Current Some Day Smoker    Packs/day: 0.25    Years: 1.00    Pack years: 0.25    Types: Cigarettes  . Smokeless tobacco: Never Used  Substance Use Topics  . Alcohol use: Yes    Comment: 1 bottle liquior on weekends  . Drug use: No    Allergies  Allergen Reactions  . Sulfa Antibiotics Anaphylaxis and Hives  . Sulfur Anaphylaxis  . Other Hives    Mayonnaise    Current Outpatient Medications:  Marland Kitchen  Multiple Vitamins-Minerals (MULTIVITAMIN WITH MINERALS) tablet, Take 1 tablet by mouth daily., Disp: , Rfl:  .  omeprazole (PRILOSEC) 40 MG capsule, Take 1 capsule (40 mg total) by mouth 2 (two) times  daily., Disp: 60 capsule, Rfl: 3 .  OVER THE COUNTER MEDICATION, Take 1 tablet by mouth daily. 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  tinidazole (TINDAMAX) 500 MG tablet, Take 2 tablets (1,000 mg total) by mouth daily with breakfast. Repeat monthly after the period for 6 months., Disp: 10 tablet, Rfl: 6 .  valACYclovir (VALTREX) 1000 MG tablet, Take 1 tablet (1,000 mg total) by mouth daily. Take for 3 days prn for each outbreak., Disp: 30 tablet, Rfl: 11  Review of Systems  Constitutional: Negative for chills, fatigue and fever.  HENT: Positive for congestion, postnasal drip and sore throat. Negative for sinus pressure, sinus pain, sneezing and trouble swallowing.   Respiratory: Negative for cough and shortness of breath.   Cardiovascular: Negative for chest pain, palpitations and leg  swelling.  Gastrointestinal: Negative for abdominal pain.  Endocrine: Negative for polydipsia, polyphagia and polyuria.  Genitourinary: Negative for dysuria and frequency.  Musculoskeletal: Negative for arthralgias and back pain.  Neurological: Negative for dizziness and headaches.  Hematological: Negative for adenopathy. Does not bruise/bleed easily.       Objective:   Physical Exam BP 122/84 (BP Location: Right Arm, Patient Position: Sitting, Cuff Size: Large) Comment (Cuff Size): thigh cuff  Pulse 78   Temp 98.7 F (37.1 C) (Oral)   Ht 4\' 10"  (1.473 m)   Wt 290 lb (131.5 kg)   LMP 03/27/2018 (Approximate)   SpO2 96%   BMI 60.61 kg/m Nurse's notes and vital signs reviewed General- well-nourished, well-developed overweight for height obese female in no acute distress ENT-TMs dull bilaterally, patient with moderate edema and mild erythema of the nasal turbinates with narrowed nasal passages due to edema of the nasal mucosa, patient with posterior pharynx erythema with some cobblestoning and tonsillar arch erythema Neck-supple, no lymphadenopathy Lungs-clear to auscultation bilaterally Cardiovascular-regular rate and rhythm Abdomen-truncal obesity, soft and nontender Back-no CVA tenderness Rectal-patient with a hemorrhoidal skin tag the inferior opening of the rectum Extremities-no edema Skin-patient with a horizontal darkened area across the bridge of the nose consistent with an "allergic salute"       Assessment & Plan:  1. Pharyngitis, unspecified etiology Patient with complaint of recurrent sore throat especially in the mornings over the past 3 to 4 weeks.  Patient does not believe that her symptoms are related to acid reflux.  Patient does have evidence of allergic rhinitis on exam.  Patient agrees to try Zyrtec 10 mg at bedtime to see if this decreases her morning sore throat symptoms.  Complete smoking cessation also discussed and encouraged. - cetirizine (ZYRTEC) 10 MG  tablet; Take 1 tablet (10 mg total) by mouth at bedtime. To decrease postnasal drainage  Dispense: 30 tablet; Refill: 11  2. Allergic rhinitis, unspecified seasonality, unspecified trigger Patient with allergic rhinitis.  Patient appears to be very congested on exam but patient states that she does not really feel as if she is congested.  Patient does have occasional sensation of nasal congestion and postnasal drainage.  Prescription provided for Zyrtec to help with postnasal drainage - cetirizine (ZYRTEC) 10 MG tablet; Take 1 tablet (10 mg total) by mouth at bedtime. To decrease postnasal drainage  Dispense: 30 tablet; Refill: 11  3. Hemorrhoidal skin tag Patient with complaint of recent onset rectal itching and patient with presence of hemorrhoidal skin tag.  Prescription provided for hydrocortisone-based rectal cream to use up to twice daily as needed for itching and good hygiene of rectal area recommended. - hydrocortisone (PROCTOCARE-HC) 2.5 %  rectal cream; Place 1 application rectally 2 (two) times daily. As needed for itching  Dispense: 30 g; Refill: 3  An After Visit Summary was printed and given to the patient.  Return if symptoms worsen or fail to improve, for schedule well exam.

## 2018-04-26 ENCOUNTER — Emergency Department (HOSPITAL_COMMUNITY)
Admission: EM | Admit: 2018-04-26 | Discharge: 2018-04-27 | Disposition: A | Discharge status: Home / Self Care | Payer: BC Managed Care – PPO | Attending: Emergency Medicine | Admitting: Emergency Medicine

## 2018-04-26 ENCOUNTER — Encounter (HOSPITAL_COMMUNITY): Payer: Self-pay | Admitting: Emergency Medicine

## 2018-04-26 ENCOUNTER — Other Ambulatory Visit: Payer: Self-pay

## 2018-04-26 ENCOUNTER — Emergency Department (HOSPITAL_COMMUNITY): Payer: BC Managed Care – PPO

## 2018-04-26 DIAGNOSIS — Y999 Unspecified external cause status: Secondary | ICD-10-CM | POA: Insufficient documentation

## 2018-04-26 DIAGNOSIS — Z79899 Other long term (current) drug therapy: Secondary | ICD-10-CM | POA: Insufficient documentation

## 2018-04-26 DIAGNOSIS — M25572 Pain in left ankle and joints of left foot: Secondary | ICD-10-CM

## 2018-04-26 DIAGNOSIS — Y92410 Unspecified street and highway as the place of occurrence of the external cause: Secondary | ICD-10-CM | POA: Diagnosis not present

## 2018-04-26 DIAGNOSIS — S39012A Strain of muscle, fascia and tendon of lower back, initial encounter: Secondary | ICD-10-CM | POA: Diagnosis not present

## 2018-04-26 DIAGNOSIS — S3992XA Unspecified injury of lower back, initial encounter: Secondary | ICD-10-CM | POA: Diagnosis not present

## 2018-04-26 DIAGNOSIS — Y939 Activity, unspecified: Secondary | ICD-10-CM | POA: Insufficient documentation

## 2018-04-26 DIAGNOSIS — F17210 Nicotine dependence, cigarettes, uncomplicated: Secondary | ICD-10-CM | POA: Insufficient documentation

## 2018-04-26 MED ORDER — METHOCARBAMOL 500 MG PO TABS: 500.0000 mg | ORAL_TABLET | Freq: Two times a day (BID) | ORAL | 0 refills | Status: DC | PRN

## 2018-04-26 MED ORDER — NAPROXEN 500 MG PO TABS
500.0000 mg | ORAL_TABLET | Freq: Once | ORAL | Status: AC
  Administered 2018-04-27: 500 mg via ORAL
  Filled 2018-04-26: qty 1

## 2018-04-26 MED ORDER — IBUPROFEN 600 MG PO TABS: 600.0000 mg | ORAL_TABLET | Freq: Four times a day (QID) | ORAL | 0 refills | Status: DC | PRN
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
## 2018-04-26 NOTE — ED Notes (Signed)
Bed: [facility] Expected date:  Expected time:  Means of arrival:  Comments: 

## 2018-04-26 NOTE — Discharge Instructions (Signed)
Alternate ice and heat to areas of injury 3-4 times per day to limit inflammation and spasm.  Avoid strenuous activity and heavy lifting.  We recommend consistent use of ibuprofen in addition to Robaxin for muscle spasms.  Do not drive or drink alcohol after taking Robaxin as it may make you drowsy and impair your judgment.  We recommend follow-up with a primary care doctor to ensure resolution of symptoms.  Return to the ED for any new or concerning symptoms.

## 2018-04-26 NOTE — ED Notes (Signed)
Pt requesting ethanol level drawn

## 2018-04-27 NOTE — ED Notes (Signed)
Pt discharged at 0015. Orders completed prior.
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
## 2018-04-28 ENCOUNTER — Ambulatory Visit (HOSPITAL_COMMUNITY)
Admission: EM | Admit: 2018-04-28 | Discharge: 2018-04-28 | Disposition: A | Discharge status: Home / Self Care | Payer: Managed Care, Other (non HMO) | Attending: Internal Medicine | Admitting: Internal Medicine

## 2018-04-28 ENCOUNTER — Encounter (HOSPITAL_COMMUNITY): Payer: Self-pay | Admitting: Emergency Medicine

## 2018-04-28 ENCOUNTER — Other Ambulatory Visit: Payer: Self-pay

## 2018-04-28 DIAGNOSIS — M533 Sacrococcygeal disorders, not elsewhere classified: Secondary | ICD-10-CM

## 2018-04-28 DIAGNOSIS — M545 Low back pain: Secondary | ICD-10-CM | POA: Diagnosis not present

## 2018-04-28 MED ORDER — METHOCARBAMOL 500 MG PO TABS: 500.0000 mg | ORAL_TABLET | Freq: Two times a day (BID) | ORAL | 0 refills | Status: DC | PRN

## 2018-04-28 MED ORDER — NAPROXEN 500 MG PO TABS: 500.0000 mg | ORAL_TABLET | Freq: Two times a day (BID) | ORAL | 0 refills | Status: DC

## 2018-04-28 MED ORDER — OMEPRAZOLE 40 MG PO CPDR: 40.0000 mg | DELAYED_RELEASE_CAPSULE | Freq: Two times a day (BID) | ORAL | 3 refills | Status: DC
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
## 2018-05-15 ENCOUNTER — Encounter: Payer: Managed Care, Other (non HMO) | Admitting: Family Medicine

## 2018-05-29 ENCOUNTER — Encounter: Payer: Managed Care, Other (non HMO) | Admitting: Family Medicine

## 2018-07-10 ENCOUNTER — Emergency Department (HOSPITAL_COMMUNITY)
Admission: EM | Admit: 2018-07-10 | Discharge: 2018-07-10 | Disposition: A | Discharge status: Home / Self Care | Payer: Managed Care, Other (non HMO) | Attending: Emergency Medicine | Admitting: Emergency Medicine

## 2018-07-10 ENCOUNTER — Emergency Department (HOSPITAL_COMMUNITY): Payer: Managed Care, Other (non HMO)

## 2018-07-10 ENCOUNTER — Other Ambulatory Visit: Payer: Self-pay

## 2018-07-10 ENCOUNTER — Encounter (HOSPITAL_COMMUNITY): Payer: Self-pay | Admitting: Emergency Medicine

## 2018-07-10 DIAGNOSIS — F419 Anxiety disorder, unspecified: Secondary | ICD-10-CM | POA: Insufficient documentation

## 2018-07-10 DIAGNOSIS — R072 Precordial pain: Secondary | ICD-10-CM

## 2018-07-10 DIAGNOSIS — R202 Paresthesia of skin: Secondary | ICD-10-CM | POA: Insufficient documentation

## 2018-07-10 DIAGNOSIS — Z79899 Other long term (current) drug therapy: Secondary | ICD-10-CM | POA: Insufficient documentation

## 2018-07-10 DIAGNOSIS — F17210 Nicotine dependence, cigarettes, uncomplicated: Secondary | ICD-10-CM | POA: Insufficient documentation

## 2018-07-10 MED ORDER — SODIUM CHLORIDE 0.9% FLUSH: 3.0000 mL | Freq: Once | INTRAVENOUS | Status: DC

## 2018-07-10 NOTE — ED Provider Notes (Signed)
[facility] Provider Note  CSN: [id number] Arrival date & time: 07/10/18 0128  Chief Complaint(s) Chest Pain  HPI Bianca Cantrell is a 33 y.o. female with a history of acid reflux and anxiety who presents to the emergency department with midline chest pain described as sharp and stabbing in nature.  Lasting several minutes.  intermittent.  Similar to past episodes of acid reflux.  Pain is nonradiating and nonexertional.  Usually exacerbated by lying down.  At times improved with antacids.  Denies any associated shortness of breath or diaphoresis.  No nausea or vomiting.  No abdominal pain.  No recent fevers or infections.  Currently asymptomatic.  Patient reports that she believes it may be a mixture of anxiety and her GERD but wanted to come get checked out because her uncle died of a heart attack last week and since then she has been feeling more anxious.  HPI  Past Medical History Past Medical History:  Diagnosis Date  . Abnormal Pap smear    HPV  . Anemia   . Chlamydia   . Eczema   . Gallstones   . Infection    urinary tract infection  . No pertinent past medical history   . Obesity   . Sickle cell trait [facility])    Patient Active Problem List   Diagnosis Date Noted  . Abdominal pain, epigastric   . Gastroesophageal reflux disease with esophagitis   . Exposure to STD 01/05/2016  . GERD without esophagitis 02/17/2013  . Condyloma acuminatum 11/19/2012  . Overweight and obesity(278.0) 05/30/2012  . General counseling for initiation of other contraceptive measures 05/30/2012   Home Medication(s) Prior to Admission medications   Medication Sig Start Date End Date Taking? Authorizing Provider  acetaminophen (TYLENOL) 500 MG tablet Take 500 mg by mouth every 6 (six) hours as needed.    [provider]  methocarbamol (ROBAXIN) 500 MG tablet Take 1 tablet (500 mg total) by mouth every 12 (twelve) hours as needed for muscle spasms.  04/28/18   Lamptey, Bianca Mccreedy, MD  naproxen (NAPROSYN) 500 MG tablet Take 1 tablet (500 mg total) by mouth 2 (two) times daily. 04/28/18   Bianca Jansky, MD  omeprazole (PRILOSEC) 40 MG capsule Take 1 capsule (40 mg total) by mouth 2 (two) times daily. 04/28/18   Bianca Jansky, MD  OVER THE COUNTER MEDICATION Take 1 tablet by mouth daily. 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 APPENDECTOMY  2010  . BIOPSY  11/13/2017   Procedure: BIOPSY;  Surgeon: Bianca Deeds, MD;  [facility];  Service: Gastroenterology;;  . Bianca Cantrell  .  ESOPHAGOGASTRODUODENOSCOPY (EGD) WITH PROPOFOL N/A 11/13/2017   Procedure: ESOPHAGOGASTRODUODENOSCOPY (EGD) WITH PROPOFOL;  Surgeon: Bianca Deeds, MD;  [facility];  Service: Gastroenterology;  Laterality: N/A;   Family History Family History  Problem Relation Age of Onset  . Hypertension Mother   . Heart disease Mother   . Hypertension Maternal Grandmother   . Heart disease Maternal Grandmother   . Cancer Maternal Aunt        leukemia  . Cancer Maternal Uncle        pancreatic  . Other Neg Hx   . Colon cancer Neg Hx   . Esophageal cancer Neg Hx     Social History Social History   Tobacco Use  . Smoking status: Current Some Day Smoker    Packs/day: 0.25    Years: 1.00    Pack years: 0.25    Types: Cigarettes  . Smokeless tobacco: Never Used  Substance Use Topics  . Alcohol use: Yes    Comment: 1 bottle liquior on weekends  . Drug use: No   Allergies Sulfa antibiotics; Sulfur; and Other  Review of Systems Review of Systems All other systems are reviewed and are negative for acute change except as noted in the HPI  Physical Exam Vital Signs  I have reviewed the triage vital  signs BP (!) 132/46 (BP Location: Left Arm)   Pulse 92   Temp 98.3 F (36.8 C) (Oral)   Resp 18   Ht  (1.473 m)   Wt 126.1 kg   LMP 06/26/2018   SpO2 100%   BMI 58.10 kg/m   Physical Exam Vitals signs reviewed.  Constitutional:      General: She is not in acute distress.    Appearance: She is well-developed. She is not diaphoretic.  HENT:     Head: Normocephalic and atraumatic.     Nose: Nose normal.  Eyes:     General: No scleral icterus.       Right eye: No discharge.        Left eye: No discharge.     Conjunctiva/sclera: Conjunctivae normal.     Pupils: Pupils are equal, round, and reactive to light.  Neck:     Musculoskeletal: Normal range of motion and neck supple.  Cardiovascular:     Rate and Rhythm: Normal rate and regular rhythm.     Heart sounds: No murmur. No friction rub. No gallop.   Pulmonary:     Effort: Pulmonary effort is normal. No respiratory distress.     Breath sounds: Normal breath sounds. No stridor. No rales.  Abdominal:     General: There is no distension.     Palpations: Abdomen is soft.     Tenderness: There is no abdominal tenderness.  Musculoskeletal:        General: No tenderness.  Skin:    General: Skin is warm and dry.     Findings: No erythema or rash.  Neurological:     Mental Status: She is alert and oriented to person, place, and time.     ED Results and Treatments Labs (all labs ordered are listed, but only abnormal results are displayed) Labs Reviewed - No data to display  EKG  EKG Interpretation  Date/Time:  Wednesday Jul 10 2018 01:49:58 EDT Ventricular Rate:  85 PR Interval:    QRS Duration: 84 QT Interval:  381 QTC Calculation: 453 R Axis:   44 Text Interpretation:  Sinus rhythm No significant change since last tracing Confirmed by Bianca Cantrell, Bianca Cantrell [phone]) on 07/10/2018 2:12:53 AM Also confirmed  by Bianca Cantrell, Bianca Cantrell [phone]([id number]), editor Bianca Cantrell, Bianca Cantrell ([id number])  on 07/10/2018 7:52:08 AM      Radiology No results found. Pertinent labs & imaging results that were available during my care of the patient were reviewed by me and considered in my medical decision making (see chart for details).  Medications Ordered in ED Medications - No data to display                                                                                                                                  Procedures Procedures  (including critical care time)  Medical Decision Making / ED Course I have reviewed the nursing notes for this encounter and the patient's prior records (if available in EHR or on provided paperwork).    Atypical chest pain highly consistent with ACS.  EKG without acute ischemic changes or evidence of pericarditis.  Low suspicion for cardiac etiology.  Low pretest probability for pulmonary embolism.  Presentation not classic for aortic dissection or esophageal perforation. Chest x-ray without evidence suggestive of pneumonia, pneumothorax, pneumomediastinum.  No abnormal contour of the mediastinum to suggest dissection. No evidence of acute injuries.  Feel symptoms are related to acid reflux and anxiety as patient believes.  No need for additional work-up at this time.  Reassurance was provided.  The patient appears reasonably screened and/or stabilized for discharge and I doubt any other medical condition or other [facility] requiring further screening, evaluation, or treatment in the ED at this time prior to discharge.  The patient is safe for discharge with strict return precautions.   Final Clinical Impression(s) / ED Diagnoses Final diagnoses:  Anxiety  Precordial chest pain  Paresthesia    Disposition: Discharge  Condition: Good  I have discussed the results, Dx and Tx plan with the patient who expressed understanding and agree(s) with the plan. Discharge instructions discussed at  great length. The patient was given strict return precautions who verbalized understanding of the instructions. No further questions at time of discharge.    ED Discharge Orders    None       Follow Up: Cain SaupeFulp, Cammie, MD [address] [place] [id number] [phone]  Schedule an appointment as soon as possible for a visit  As needed     This chart was dictated using voice recognition software.  Despite best efforts to proofread,  errors can occur which can change the documentation meaning.   Nira Connardama, Binta Statzer Eduardo, MD 07/11/18 [phone]

## 2018-08-12 ENCOUNTER — Ambulatory Visit: Payer: Managed Care, Other (non HMO) | Admitting: Family Medicine

## 2018-10-03 ENCOUNTER — Encounter (HOSPITAL_COMMUNITY): Payer: Self-pay | Admitting: Emergency Medicine

## 2018-10-03 ENCOUNTER — Emergency Department (HOSPITAL_COMMUNITY)
Admission: EM | Admit: 2018-10-03 | Discharge: 2018-10-03 | Disposition: A | Discharge status: Home / Self Care | Payer: Managed Care, Other (non HMO) | Attending: Emergency Medicine | Admitting: Emergency Medicine

## 2018-10-03 ENCOUNTER — Other Ambulatory Visit: Payer: Self-pay

## 2018-10-03 DIAGNOSIS — F17210 Nicotine dependence, cigarettes, uncomplicated: Secondary | ICD-10-CM | POA: Insufficient documentation

## 2018-10-03 DIAGNOSIS — Z3202 Encounter for pregnancy test, result negative: Secondary | ICD-10-CM | POA: Insufficient documentation

## 2018-10-03 LAB — I-STAT BETA HCG BLOOD, ED (MC, WL, AP ONLY): I-stat hCG, quantitative: <5 m[IU]/mL (ref <5)

## 2018-10-03 NOTE — ED Triage Notes (Signed)
Patient here from home with complaints of abd cramping. States that she could be pregnant and would like a blood test. 6 days late on period.

## 2018-10-03 NOTE — ED Notes (Signed)
Patient left before nurse came to give discharge papers.

## 2018-10-03 NOTE — ED Notes (Signed)
Patient requesting [place] to confirm

## 2018-10-03 NOTE — Discharge Instructions (Addendum)
If you don't start your menstrual cycle this week, take another home pregnancy test in 1 week.

## 2018-10-03 NOTE — ED Provider Notes (Signed)
[facility]-EMER[facility]PT Provider Note   CSN: [id number] Arrival date & time: 10/03/18  0123     History   Chief Complaint Chief Complaint  Patient presents with  . Possible Pregnancy    HPI Bianca Cantrell is a 33 y.o. female.     Patient presents to the emergency department with a chief complaint of light on her.  She states that she is 6 days late on her period.  She would like a blood pregnancy test.  She states that she took a home urine pregnancy test which was positive, took another which was negative, and now she does not know whether she is pregnant or not.  She reports having some mild cramping, but denies any bleeding.  Denies any other associated symptoms.  The history is provided by the patient. No language interpreter was used.    Past Medical History:  Diagnosis Date  . Abnormal Pap smear    HPV  . Anemia   . Chlamydia   . Eczema   . Gallstones   . Infection    urinary tract infection  . No pertinent past medical history   . Obesity   . Sickle cell trait [facility])     Patient Active Problem List   Diagnosis Date Noted  . Abdominal pain, epigastric   . Gastroesophageal reflux disease with esophagitis   . Exposure to STD 01/05/2016  . GERD without esophagitis 02/17/2013  . Condyloma acuminatum 11/19/2012  . Overweight and obesity(278.0) 05/30/2012  . General counseling for initiation of other contraceptive measures 05/30/2012    Past Surgical History:  Procedure Laterality Date  . APPENDECTOMY  2010  . BIOPSY  11/13/2017   Procedure: BIOPSY;  Surgeon: Benancio DeedsArmbruster, Steven P, MD;  [facility];  Service: Gastroenterology;;  . Gwenith SpitzHOLECYSTECTOMY  2011  . ESOPHAGOGASTRODUODENOSCOPY (EGD) WITH PROPOFOL N/A 11/13/2017   Procedure: ESOPHAGOGASTRODUODENOSCOPY (EGD) WITH PROPOFOL;  Surgeon: Benancio DeedsArmbruster, Steven P, MD;  [facility];  Service: Gastroenterology;  Laterality: N/A;     OB History    Gravida  4   Para  4   Term  4   Preterm  0   AB  0   Living  4     SAB  0   TAB  0   Ectopic  0   Multiple  0   Live Births  4            Home Medications    Prior to Admission medications   Medication Sig Start Date End Date Taking? Authorizing Provider  acetaminophen (TYLENOL) 500 MG tablet Take 500 mg by mouth every 6 (six) hours as needed.    [provider]  methocarbamol (ROBAXIN) 500 MG tablet Take 1 tablet (500 mg total) by mouth every 12 (twelve) hours as needed for muscle spasms. 04/28/18   Lamptey, Britta MccreedyPhilip O, MD  naproxen (NAPROSYN) 500 MG tablet Take 1 tablet (500 mg total) by mouth 2 (two) times daily. 04/28/18   Merrilee JanskyLamptey, Philip O, MD  omeprazole (PRILOSEC) 40 MG capsule Take 1 capsule (40 mg total) by mouth 2 (two) times daily. 04/28/18   Merrilee JanskyLamptey, Philip O, MD  OVER THE COUNTER MEDICATION Take 1 tablet by mouth daily. 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 Hypertension Mother   . Heart disease Mother   . Hypertension Maternal Grandmother   . Heart disease Maternal Grandmother   .  Cancer Maternal Aunt        leukemia  . Cancer Maternal Uncle        pancreatic  . Other Neg Hx   . Colon cancer Neg Hx   . Esophageal cancer Neg Hx     Social History Social History   Tobacco Use  . Smoking status: Current Some Day Smoker    Packs/day: 0.25    Years: 1.00    Pack years: 0.25    Types: Cigarettes  . Smokeless tobacco: Never Used  Substance Use Topics  . Alcohol use: Yes    Comment: 1 bottle liquior on weekends  . Drug use: No     Allergies   Sulfa antibiotics, Sulfur, and Other   Review of Systems Review of Systems  All other systems reviewed and are negative.    Physical Exam Updated Vital Signs BP (!) 137/104 (BP Location: Left Arm)   Pulse 86   Temp 98 F (36.7 C) (Oral)   LMP 08/25/2018   SpO2 96%   Physical Exam Vitals signs and nursing note reviewed.   Constitutional:      General: She is not in acute distress.    Appearance: She is well-developed.  HENT:     Head: Normocephalic and atraumatic.  Eyes:     Conjunctiva/sclera: Conjunctivae normal.  Neck:     Musculoskeletal: Neck supple.  Cardiovascular:     Rate and Rhythm: Normal rate.     Heart sounds: No murmur.  Pulmonary:     Effort: Pulmonary effort is normal. No respiratory distress.  Abdominal:     General: There is no distension.  Skin:    General: Skin is dry.  Neurological:     Mental Status: She is alert.  Psychiatric:        Mood and Affect: Mood normal.        Behavior: Behavior normal.      ED Treatments / Results  Labs (all labs ordered are listed, but only abnormal results are displayed) Labs Reviewed  I-STAT BETA HCG BLOOD, ED ([facility], [facility], [facility] ONLY)    EKG None  Radiology No results found.  Procedures Procedures (including critical care time)  Medications Ordered in ED Medications - No data to display   Initial Impression / Assessment and Plan / ED Course  I have reviewed the triage vital signs and the nursing notes.  Pertinent labs & imaging results that were available during my care of the patient were reviewed by me and considered in my medical decision making (see chart for details).        Patient reports being 6 days late on her menstrual cycle.  She had reportedly 1+ urine pregnancy test at home, and one negative.  hCG is negative here.  Due to the fact that she is only 6 days late, I have advised patient to take another pregnancy test in a week if she doesn't start her period and to follow-up with her OBGYN.  Final Clinical Impressions(s) / ED Diagnoses   Final diagnoses:  Pregnancy examination or test, negative result    ED Discharge Orders    None       Montine Circle, PA-C 10/03/18 1700    Ripley Fraise, MD 10/03/18 [phone]

## 2018-10-05 ENCOUNTER — Emergency Department
Admission: EM | Admit: 2018-10-05 | Discharge: 2018-10-06 | Discharge status: Against Medical Advice | Payer: BC Managed Care – PPO | Attending: Emergency Medicine | Admitting: Emergency Medicine

## 2018-10-05 ENCOUNTER — Other Ambulatory Visit: Payer: Self-pay

## 2018-10-05 DIAGNOSIS — R1030 Lower abdominal pain, unspecified: Secondary | ICD-10-CM | POA: Diagnosis not present

## 2018-10-05 DIAGNOSIS — Z5321 Procedure and treatment not carried out due to patient leaving prior to being seen by health care provider: Secondary | ICD-10-CM | POA: Diagnosis not present

## 2018-10-05 LAB — URINALYSIS, COMPLETE (UACMP) WITH MICROSCOPIC
Bacteria, UA: NONE SEEN
Bilirubin Urine: NEGATIVE
Glucose, UA: NEGATIVE mg/dL
Hgb urine dipstick: NEGATIVE
Ketones, ur: NEGATIVE mg/dL
Leukocytes,Ua: NEGATIVE
Nitrite: NEGATIVE
Protein, ur: NEGATIVE mg/dL
Specific Gravity, Urine: 1.011 (ref 1.005–1.030)
WBC, UA: NONE SEEN WBC/hpf (ref 0–5)
pH: 7 (ref 5.0–8.0)

## 2018-10-05 LAB — POCT PREGNANCY, URINE: Preg Test, Ur: NEGATIVE

## 2018-10-05 LAB — HCG, QUANTITATIVE, PREGNANCY: hCG, Beta Chain, Quant, S: <1 m[IU]/mL (ref <5)

## 2018-10-05 NOTE — ED Notes (Signed)
Patient called for a room with no answer. 

## 2018-10-05 NOTE — ED Notes (Signed)
Patient called for a room with no anwer

## 2018-10-05 NOTE — ED Triage Notes (Signed)
Pt c/o lower abdominal cramping approx 1 week late on menstrual cycle. One positive and one negative pregnancy test. HCG bloodwork was negative per patient report. Denies vaginal bleeding. Pt alert and oriented X4, active, cooperative, pt in NAD. RR even and unlabored, color WNL.

## 2018-10-09 ENCOUNTER — Other Ambulatory Visit: Payer: Self-pay

## 2018-10-09 ENCOUNTER — Emergency Department (HOSPITAL_COMMUNITY)
Admission: EM | Admit: 2018-10-09 | Discharge: 2018-10-09 | Discharge status: Against Medical Advice | Payer: Managed Care, Other (non HMO) | Attending: Emergency Medicine | Admitting: Emergency Medicine

## 2018-10-09 ENCOUNTER — Encounter (HOSPITAL_COMMUNITY): Payer: Self-pay | Admitting: Emergency Medicine

## 2018-10-09 DIAGNOSIS — N898 Other specified noninflammatory disorders of vagina: Secondary | ICD-10-CM | POA: Insufficient documentation

## 2018-10-09 DIAGNOSIS — R102 Pelvic and perineal pain: Secondary | ICD-10-CM | POA: Insufficient documentation

## 2018-10-09 DIAGNOSIS — Z3202 Encounter for pregnancy test, result negative: Secondary | ICD-10-CM

## 2018-10-09 DIAGNOSIS — Z5329 Procedure and treatment not carried out because of patient's decision for other reasons: Secondary | ICD-10-CM | POA: Insufficient documentation

## 2018-10-09 DIAGNOSIS — Z79899 Other long term (current) drug therapy: Secondary | ICD-10-CM | POA: Insufficient documentation

## 2018-10-09 DIAGNOSIS — N939 Abnormal uterine and vaginal bleeding, unspecified: Secondary | ICD-10-CM | POA: Insufficient documentation

## 2018-10-09 DIAGNOSIS — F17210 Nicotine dependence, cigarettes, uncomplicated: Secondary | ICD-10-CM | POA: Insufficient documentation

## 2018-10-09 LAB — URINALYSIS, ROUTINE W REFLEX MICROSCOPIC
Bilirubin Urine: NEGATIVE
Glucose, UA: NEGATIVE mg/dL
Hgb urine dipstick: NEGATIVE
Ketones, ur: NEGATIVE mg/dL
Leukocytes,Ua: NEGATIVE
Nitrite: NEGATIVE
Protein, ur: NEGATIVE mg/dL
Specific Gravity, Urine: 1.013 (ref 1.005–1.030)
pH: 5 (ref 5.0–8.0)

## 2018-10-09 LAB — WET PREP, GENITAL
Clue Cells Wet Prep HPF POC: NONE SEEN
Sperm: NONE SEEN
Trich, Wet Prep: NONE SEEN
Yeast Wet Prep HPF POC: NONE SEEN

## 2018-10-09 LAB — PREGNANCY, URINE: Preg Test, Ur: NEGATIVE

## 2018-10-09 NOTE — ED Notes (Signed)
Patient left before getting blood drawn. Patient has not came back to the room for about 20 minutes.

## 2018-10-09 NOTE — ED Provider Notes (Signed)
[facility]-EMERGENCY DEPT Provider Note   CSN: [id number] Arrival date & time: 10/09/18  0206     History   Chief Complaint Chief Complaint  Patient presents with  . Abdominal Cramping  . Possible Pregnancy    HPI Bianca Cantrell is a 33 y.o. female with a hx of tobacco abuse, anemia, chlamydia, obesity, sickle cell trait, GERD, and prior appendectomy/cholecystectomy who presents to the ED with complaints of intermittent pelvic pain x 1-2 weeks & vaginal bleeding that began tonight. Patient states that she has had intermittent mild to moderate bilateral pelvic cramping. No specific alleviating/aggravating factors. No acute change in pain this evening. States when she wiped after urinating she noted some vaginal bleeding. Has had some mild bleeding since described as pink tinged discharge. Her LMP was 08/29/18. She states she has had + pregnancy tests @ home. Also reports white vaginal discharge. She is not concerned for STDs. 4 prior pregnancies without complications. Denies fever, chills, N/V, diarrhea, melena, dysuria, or syncope.      HPI  Past Medical History:  Diagnosis Date  . Abnormal Pap smear    HPV  . Anemia   . Chlamydia   . Eczema   . Gallstones   . Infection    urinary tract infection  . No pertinent past medical history   . Obesity   . Sickle cell trait [facility](H[facility])     Patient Active Problem List   Diagnosis Date Noted  . Abdominal pain, epigastric   . Gastroesophageal reflux disease with esophagitis   . Exposure to STD 01/05/2016  . GERD without esophagitis 02/17/2013  . Condyloma acuminatum 11/19/2012  . Overweight and obesity(278.0) 05/30/2012  . General counseling for initiation of other contraceptive measures 05/30/2012    Past Surgical History:  Procedure Laterality Date  . APPENDECTOMY  2010  . BIOPSY  11/13/2017   Procedure: BIOPSY;  Surgeon: Benancio DeedsArmbruster, Steven P, MD;  Location: [facility];  Service: Gastroenterology;;  .  Bianca SpitzHOLECYSTECTOMY  2011  . ESOPHAGOGASTRODUODENOSCOPY (EGD) WITH PROPOFOL N/A 11/13/2017   Procedure: ESOPHAGOGASTRODUODENOSCOPY (EGD) WITH PROPOFOL;  Surgeon: Benancio DeedsArmbruster, Steven P, MD;  Location: [facility];  Service: Gastroenterology;  Laterality: N/A;     OB History    Gravida  4   Para  4   Term  4   Preterm  0   AB  0   Living  4     SAB  0   TAB  0   Ectopic  0   Multiple  0   Live Births  4            Home Medications    Prior to Admission medications   Medication Sig Start Date End Date Taking? Authorizing Provider  acetaminophen (TYLENOL) 500 MG tablet Take 500 mg by mouth every 6 (six) hours as needed.    [provider]  methocarbamol (ROBAXIN) 500 MG tablet Take 1 tablet (500 mg total) by mouth every 12 (twelve) hours as needed for muscle spasms. 04/28/18   Lamptey, Britta MccreedyPhilip O, MD  naproxen (NAPROSYN) 500 MG tablet Take 1 tablet (500 mg total) by mouth 2 (two) times daily. 04/28/18   Merrilee JanskyLamptey, Philip O, MD  omeprazole (PRILOSEC) 40 MG capsule Take 1 capsule (40 mg total) by mouth 2 (two) times daily. 04/28/18   Merrilee JanskyLamptey, Philip O, MD  OVER THE COUNTER MEDICATION Take 1 tablet by mouth daily. 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 Hypertension Mother   . Heart disease Mother   . Hypertension Maternal Grandmother   . Heart disease Maternal Grandmother   . Cancer Maternal Aunt        leukemia  . Cancer Maternal Uncle        pancreatic  . Other Neg Hx   . Colon cancer Neg Hx   . Esophageal cancer Neg Hx     Social History Social History   Tobacco Use  . Smoking status: Current Some Day Smoker    Packs/day: 0.25    Years: 1.00    Pack years: 0.25    Types: Cigarettes  . Smokeless tobacco: Never Used  Substance Use Topics  . Alcohol use: Yes    Comment: 1 bottle liquior on weekends  . Drug use: No     Allergies   Sulfa antibiotics, Sulfur, and  Other   Review of Systems Review of Systems  Constitutional: Negative for chills and fever.  Respiratory: Negative for shortness of breath.   Cardiovascular: Negative for chest pain.  Gastrointestinal: Negative for blood in stool, constipation, diarrhea, nausea and vomiting.  Genitourinary: Positive for pelvic pain (intermittent cramping), vaginal bleeding and vaginal discharge. Negative for dysuria.  Neurological: Negative for syncope.  All other systems reviewed and are negative.    Physical Exam Updated Vital Signs BP 131/84 (BP Location: Right Arm)   Pulse 89   Temp 98.5 F (36.9 C) (Oral)   Resp 19   Ht 4\' 10"  (1.473 m)   Wt 122.5 kg   SpO2 99%   BMI 56.43 kg/m   Physical Exam Vitals signs and nursing note reviewed. Exam conducted with a chaperone present.  Constitutional:      General: She is not in acute distress.    Appearance: She is well-developed. She is not toxic-appearing.  HENT:     Head: Normocephalic and atraumatic.  Eyes:     General:        Right eye: No discharge.        Left eye: No discharge.     Conjunctiva/sclera: Conjunctivae normal.  Neck:     Musculoskeletal: Neck supple.  Cardiovascular:     Rate and Rhythm: Normal rate and regular rhythm.  Pulmonary:     Effort: Pulmonary effort is normal. No respiratory distress.     Breath sounds: Normal breath sounds. No wheezing, rhonchi or rales.  Abdominal:     General: There is no distension.     Palpations: Abdomen is soft.     Tenderness: There is no abdominal tenderness. There is no right CVA tenderness, left CVA tenderness, guarding or rebound.  Genitourinary:    Exam position: Supine.     Labia:        Right: No lesion.        Left: No lesion.      Vagina: Vaginal discharge (white mild amount) present. No bleeding.     Cervix: No cervical motion tenderness or friability.     Adnexa:        Right: No mass, tenderness or fullness.         Left: No mass, tenderness or fullness.        Comments: [facility] EDT present as chaperone.  Skin:    General: Skin is warm and dry.     Findings: No rash.  Neurological:     Mental Status: She is alert.     Comments: Clear speech.  Psychiatric:        Behavior: Behavior normal.    ED Treatments / Results  Labs (all labs ordered are listed, but only abnormal results are displayed) Labs Reviewed  WET PREP, GENITAL - Abnormal; Notable for the following components:      Result Value   WBC, Wet Prep HPF POC FEW (*)    All other components within normal limits  URINALYSIS, ROUTINE W REFLEX MICROSCOPIC - Abnormal; Notable for the following components:   APPearance HAZY (*)    All other components within normal limits  PREGNANCY, URINE  RPR  HIV ANTIBODY (ROUTINE TESTING W REFLEX)  GC/CHLAMYDIA PROBE AMP ([place] NOT [facility]    EKG None  Radiology No results found.  Procedures Procedures (including critical care time)  Medications Ordered in ED Medications - No data to display   Initial Impression / Assessment and Plan / ED Course  I have reviewed the triage vital signs and the nursing notes.  Pertinent labs & imaging results that were available during my care of the patient were reviewed by me and considered in my medical decision making (see chart for details).   Patient presents to the ED w/ complaints of intermittent pelvic pain x 1-2 weeks & vaginal bleeding this evening with reported positive pregnancy tests @ home. LMP 08/29/18. Nontoxic appearing, no apparent distress, vitals WNL. No abdominal, adnexal, or cervical motion tenderness- exam not consistent w/ PID, doubt TOA. Preg test negative- doubt ectopic or miscarriage, also had negative testing in the ED 7/30. UA without UTI or hematuria. Wet prep- few wbcs, no trich/BV/yeast. STD testing pending. No peritoneal signs on abdominal exam to suggest acute surgical abdomen. Patient was informed of her UA & preg testing results and likely need for obgyn follow  up, I attempted to return to the room to inform her of her wet prep results, however nursing staff informed me she eloped.   Patient appeared hemodynamically stable & in no acute distress throughout her ER visit.   Final Clinical Impressions(s) / ED Diagnoses   Final diagnoses:  Pelvic pain in female  Negative pregnancy test    ED Discharge Orders    None       Cherly Andersonetrucelli, Merton Wadlow R, PA-C 10/09/18 0530    Ward, Layla MawKristen N, DO 10/09/18 [phone]

## 2018-10-09 NOTE — ED Triage Notes (Signed)
Patient here from home with complaints of abd cramping x1 week. Reports that her cycle is late and has had 3 confirmed pregnancy test over the past week although blood HCG test was neg. Reports spotting tonight also.

## 2018-10-10 ENCOUNTER — Ambulatory Visit (INDEPENDENT_AMBULATORY_CARE_PROVIDER_SITE_OTHER): Payer: Self-pay

## 2018-10-10 DIAGNOSIS — N926 Irregular menstruation, unspecified: Secondary | ICD-10-CM

## 2018-10-10 DIAGNOSIS — Z3202 Encounter for pregnancy test, result negative: Secondary | ICD-10-CM

## 2018-10-10 LAB — GC/CHLAMYDIA PROBE AMP (~~LOC~~) NOT AT ARMC
Chlamydia: NEGATIVE
Neisseria Gonorrhea: NEGATIVE

## 2018-10-10 LAB — POCT URINE PREGNANCY: Preg Test, Ur: NEGATIVE

## 2018-10-10 NOTE — Progress Notes (Addendum)
[facility] presents today for UPT. UPT neg at ER yesterday.  Pt requests another UPT to confirm accuracy.  Negative quant <1 on Saturday 8/1 Pt requests an ultrasound    LMP: 08/31/2018 per pt     OBJECTIVE: Appears well, in no apparent distress.  OB History    Gravida  4   Para  4   Term  4   Preterm  0   AB  0   Living  4     SAB  0   TAB  0   Ectopic  0   Multiple  0   Live Births  4          Home UPT Result: faint positive per pt  In-Office UPT result: negative  I have reviewed the patient's medical, obstetrical, social, and family histories, and medications.   ASSESSMENT: Negative pregnancy test  PLAN: You may take another UPT in 2 wks Schedule provider visit to discuss trying to conceive  Requested ultrasounds are at the provider's discretion

## 2018-10-25 ENCOUNTER — Encounter: Payer: Self-pay | Admitting: Family Medicine

## 2018-10-25 ENCOUNTER — Ambulatory Visit (INDEPENDENT_AMBULATORY_CARE_PROVIDER_SITE_OTHER): Payer: Self-pay | Admitting: Family Medicine

## 2018-10-25 ENCOUNTER — Other Ambulatory Visit: Payer: Self-pay

## 2018-10-25 VITALS — BP 124/86 | HR 78 | Ht <= 58 in | Wt 286.0 lb

## 2018-10-25 DIAGNOSIS — Z3202 Encounter for pregnancy test, result negative: Secondary | ICD-10-CM

## 2018-10-25 DIAGNOSIS — N912 Amenorrhea, unspecified: Secondary | ICD-10-CM

## 2018-10-25 LAB — POCT URINE PREGNANCY: Preg Test, Ur: NEGATIVE

## 2018-10-25 NOTE — Progress Notes (Signed)
Pt states that she had a cycle since June, pt states that she had a positive pregnancy test at home, negative test in office on 10-10-18. Pt states that she had brownish bleeding and a week ago and thinks that she passed some tissue. Pt states that she occasionally has some cramping. Pt states that she had avnegative UPT at home on Monday. Pt is not trying to conceive but wants to know what is going on with her body.

## 2018-10-25 NOTE — Patient Instructions (Signed)

## 2018-10-26 LAB — BETA HCG QUANT (REF LAB): hCG Quant: <1 m[IU]/mL

## 2018-10-26 LAB — TSH: TSH: 3.3 u[IU]/mL (ref 0.450–4.500)

## 2018-10-26 LAB — PROLACTIN: Prolactin: 16.2 ng/mL (ref 4.8–23.3)

## 2018-10-28 ENCOUNTER — Encounter: Payer: Self-pay | Admitting: Family Medicine

## 2018-10-28 DIAGNOSIS — N912 Amenorrhea, unspecified: Secondary | ICD-10-CM | POA: Insufficient documentation

## 2018-10-28 NOTE — Assessment & Plan Note (Signed)
Secondary, will check labs. Depending on results, can offer Provera to induce cycle.

## 2018-10-28 NOTE — Progress Notes (Signed)
   Subjective:    Patient ID: Bianca [facility]rell is a 33 y.o. female presenting with GYN  on 10/25/2018  HPI: Reports previously very normal cycles. Has several babies. Missed 1st cycle in June with 4 + home UPTs. Negative in office. Does not feel pregnant, but should have had a cycle. Is not feeling ovulation either. No recent weight changes.  Review of Systems  Constitutional: Negative for chills and fever.  Respiratory: Negative for shortness of breath.   Cardiovascular: Negative for chest pain.  Gastrointestinal: Negative for abdominal pain, nausea and vomiting.  Genitourinary: Negative for dysuria.  Skin: Negative for rash.      Objective:    BP 124/86   Pulse 78   Ht 4\' 10"  (1.473 m)   Wt 286 lb (129.7 kg)   BMI 59.77 kg/m  Physical Exam Constitutional:      General: She is not in acute distress.    Appearance: She is well-groomed. She is morbidly obese.  HENT:     Head: Normocephalic and atraumatic.  Eyes:     General: No scleral icterus. Neck:     Musculoskeletal: Neck supple.  Cardiovascular:     Rate and Rhythm: Normal rate.  Pulmonary:     Effort: Pulmonary effort is normal.  Abdominal:     Palpations: Abdomen is soft.  Skin:    General: Skin is warm and dry.  Neurological:     Mental Status: She is alert and oriented to person, place, and time.         Assessment & Plan:   Problem List Items Addressed This Visit      Unprioritized   Amenorrhea - Primary    Secondary, will check labs. Depending on results, can offer Provera to induce cycle.      Relevant Orders   POCT urine pregnancy (Completed)   TSH (Completed)   Prolactin (Completed)   Beta hCG quant (ref lab) (Completed)      Total face-to-face time with patient: 15 minutes. Over 50% of encounter was spent on counseling and coordination of care. Return if symptoms worsen or fail to improve.  [facility]ca [facility]rell 10/28/2018 9:07 AM

## 2019-02-05 ENCOUNTER — Emergency Department (HOSPITAL_COMMUNITY)
Admission: EM | Admit: 2019-02-05 | Discharge: 2019-02-05 | Disposition: A | Discharge status: Home / Self Care | Payer: BC Managed Care – PPO | Attending: Emergency Medicine | Admitting: Emergency Medicine

## 2019-02-05 ENCOUNTER — Other Ambulatory Visit: Payer: Self-pay

## 2019-02-05 ENCOUNTER — Encounter (HOSPITAL_COMMUNITY): Payer: Self-pay | Admitting: Family Medicine

## 2019-02-05 DIAGNOSIS — F17210 Nicotine dependence, cigarettes, uncomplicated: Secondary | ICD-10-CM | POA: Insufficient documentation

## 2019-02-05 DIAGNOSIS — M549 Dorsalgia, unspecified: Secondary | ICD-10-CM | POA: Insufficient documentation

## 2019-02-05 DIAGNOSIS — Z041 Encounter for examination and observation following transport accident: Secondary | ICD-10-CM | POA: Diagnosis not present

## 2019-02-05 DIAGNOSIS — M545 Low back pain: Secondary | ICD-10-CM | POA: Diagnosis not present

## 2019-02-05 MED ORDER — METHOCARBAMOL 500 MG PO TABS: 500.0000 mg | ORAL_TABLET | Freq: Two times a day (BID) | ORAL | 0 refills | Status: DC

## 2019-02-05 MED ORDER — NAPROXEN 500 MG PO TABS: 500.0000 mg | ORAL_TABLET | Freq: Two times a day (BID) | ORAL | 0 refills | Status: DC
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
## 2019-02-05 NOTE — Discharge Instructions (Addendum)
Please take medication as prescribed. DO NOT DRIVE WHILE ON THE MUSCLE RELAXER AS THIS CAN MAKE YOU DROWSY. I would recommend taking this at nighttime to help you sleep and to take the Naproxen (anti inflammatory) during the day.   Please follow up with your PCP regarding your ED visit today.

## 2019-02-05 NOTE — ED Notes (Signed)
An After Visit Summary was printed and given to the patient. Discharge instructions given and no further questions at this time.  

## 2019-02-13 ENCOUNTER — Ambulatory Visit (INDEPENDENT_AMBULATORY_CARE_PROVIDER_SITE_OTHER): Payer: BC Managed Care – PPO | Admitting: Obstetrics & Gynecology

## 2019-02-13 ENCOUNTER — Other Ambulatory Visit: Payer: Self-pay

## 2019-02-13 DIAGNOSIS — Z30013 Encounter for initial prescription of injectable contraceptive: Secondary | ICD-10-CM | POA: Diagnosis not present

## 2019-02-13 DIAGNOSIS — N938 Other specified abnormal uterine and vaginal bleeding: Secondary | ICD-10-CM

## 2019-02-13 MED ORDER — MEDROXYPROGESTERONE ACETATE 150 MG/ML IM SUSP
150.0000 mg | Freq: Once | INTRAMUSCULAR | Status: AC
  Administered 2019-02-13: 150 mg via INTRAMUSCULAR

## 2019-02-13 MED ORDER — MEDROXYPROGESTERONE ACETATE 150 MG/ML IM SUSP: 150.0000 mg | Freq: Once | INTRAMUSCULAR | Status: DC

## 2019-02-13 NOTE — Progress Notes (Signed)
Patient ID: Bianca Cantrell, female   DOB: January 14, 1986, 34 y.o.   MRN: [id number]  Chief Complaint  Patient presents with  . Menstrual Problem    HPI Bianca Cantrell is a 33 y.o. female.  [id number] No LMP recorded. (Menstrual status: Irregular Periods). Her last menses was normal 2 weeks ago but for several months she had irregular cycle and 2-3 weeks of bleeding with clots in September. Mild to moderate cramps only. She uses not BCM but doesn't want to conceive. Depo provera has been used previously and she would like to try this again. HPI  Past Medical History:  Diagnosis Date  . Abnormal Pap smear    HPV  . Anemia   . Chlamydia   . Eczema   . Gallstones   . Infection    urinary tract infection  . No pertinent past medical history   . Obesity   . Sickle cell trait [facility])     Past Surgical History:  Procedure Laterality Date  . APPENDECTOMY  2010  . BIOPSY  11/13/2017   Procedure: BIOPSY;  Surgeon: Benancio Deeds, MD;  Location: [facility];  Service: Gastroenterology;;  . [name]  . ESOPHAGOGASTRODUODENOSCOPY (EGD) WITH PROPOFOL N/A 11/13/2017   Procedure: ESOPHAGOGASTRODUODENOSCOPY (EGD) WITH PROPOFOL;  Surgeon: Benancio Deeds, MD;  Location: [facility];  Service: Gastroenterology;  Laterality: N/A;    Family History  Problem Relation Age of Onset  . Hypertension Mother   . Heart disease Mother   . Hypertension Maternal Grandmother   . Heart disease Maternal Grandmother   . Cancer Maternal Aunt        leukemia  . Cancer Maternal Uncle        pancreatic  . Other Neg Hx   . Colon cancer Neg Hx   . Esophageal cancer Neg Hx     Social History Social History   Tobacco Use  . Smoking status: Current Some Day Smoker    Packs/day: 0.25    Years: 1.00    Pack years: 0.25    Types: Cigarettes  . Smokeless tobacco: Never Used  Substance Use Topics  . Alcohol use: Yes    Comment: 1 bottle liquior on weekends  . Drug use: No    Allergies   Allergen Reactions  . Sulfa Antibiotics Anaphylaxis and Hives  . Sulfur Anaphylaxis  . Other Hives    Mayonnaise     Current Outpatient Medications  Medication Sig Dispense Refill  . acetaminophen (TYLENOL) 500 MG tablet Take 500 mg by mouth every 6 (six) hours as needed for mild pain.     . methocarbamol (ROBAXIN) 500 MG tablet Take 1 tablet (500 mg total) by mouth 2 (two) times daily. (Patient not taking: Reported on 02/13/2019) 20 tablet 0  . naproxen (NAPROSYN) 500 MG tablet Take 1 tablet (500 mg total) by mouth 2 (two) times daily. (Patient not taking: Reported on 02/13/2019) 30 tablet 0  . omeprazole (PRILOSEC) 40 MG capsule Take 1 capsule (40 mg total) by mouth 2 (two) times daily. (Patient not taking: Reported on 10/09/2018) 60 capsule 3  . OVER THE COUNTER MEDICATION Take 1 tablet by mouth daily. 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 medroxyPROGESTERone (DEPO-PROVERA) injection 150 mg  150 mg Intramuscular Once [name], MD        Review of Systems Review of Systems  Constitutional: Negative.  Respiratory: Negative.   Gastrointestinal: Negative.   Endocrine: Negative.  Negative for heat intolerance.  Genitourinary: Positive for menstrual problem. Negative for pelvic pain, vaginal bleeding and vaginal discharge.    Blood pressure 127/84, pulse 93, height 4\' 10"  (1.473 m), weight 290 lb (131.5 kg).  Physical Exam Physical Exam Vitals and nursing note reviewed.  Constitutional:      Appearance: She is obese. She is not ill-appearing.  Cardiovascular:     Rate and Rhythm: Normal rate.  Pulmonary:     Effort: Pulmonary effort is normal.  Skin:    General: Skin is warm and dry.  Neurological:     General: No focal deficit present.     Mental Status: She is alert.  Psychiatric:        Mood and Affect: Mood normal.     Data Reviewed Last pap smear  nl  Assessment Obesity, morbid, BMI 50 or higher (HCC)  DUB (dysfunctional uterine bleeding) - Plan: medroxyPROGESTERone (DEPO-PROVERA) injection 150 mg, DISCONTINUED: medroxyPROGESTERone (DEPO-PROVERA) injection 150 mg  Encounter for prescription for depo-Provera - Plan: medroxyPROGESTERone (DEPO-PROVERA) injection 150 mg, DISCONTINUED: medroxyPROGESTERone (DEPO-PROVERA) injection 150 mg DUB possibly oligoovulatory, PCOS -TSH and PRL were nl 10/2018   Plan Depo provera 150 mg IM Consider LARC, especially LNG IUD RTC 3 months    [name] 02/13/2019, 11:26 AM

## 2019-02-13 NOTE — Patient Instructions (Signed)
Levonorgestrel intrauterine device (IUD) What is this medicine? LEVONORGESTREL IUD (LEE voe nor jes trel) is a contraceptive (birth control) device. The device is placed inside the uterus by a healthcare professional. It is used to prevent pregnancy. This device can also be used to treat heavy bleeding that occurs during your period. This medicine may be used for other purposes; ask your health care provider or pharmacist if you have questions. COMMON BRAND NAME(S): Kyleena, LILETTA, Mirena, Skyla What should I tell my health care provider before I take this medicine? They need to know if you have any of these conditions:  abnormal Pap smear  cancer of the breast, uterus, or cervix  diabetes  endometritis  genital or pelvic infection now or in the past  have more than one sexual partner or your partner has more than one partner  heart disease  history of an ectopic or tubal pregnancy  immune system problems  IUD in place  liver disease or tumor  problems with blood clots or take blood-thinners  seizures  use intravenous drugs  uterus of unusual shape  vaginal bleeding that has not been explained  an unusual or allergic reaction to levonorgestrel, other hormones, silicone, or polyethylene, medicines, foods, dyes, or preservatives  pregnant or trying to get pregnant  breast-feeding How should I use this medicine? This device is placed inside the uterus by a health care professional. Talk to your pediatrician regarding the use of this medicine in children. Special care may be needed. Overdosage: If you think you have taken too much of this medicine contact a poison control center or emergency room at once. NOTE: This medicine is only for you. Do not share this medicine with others. What if I miss a dose? This does not apply. Depending on the brand of device you have inserted, the device will need to be replaced every 3 to 6 years if you wish to continue using this type  of birth control. What may interact with this medicine? Do not take this medicine with any of the following medications:  amprenavir  bosentan  fosamprenavir This medicine may also interact with the following medications:  aprepitant  armodafinil  barbiturate medicines for inducing sleep or treating seizures  bexarotene  boceprevir  griseofulvin  medicines to treat seizures like carbamazepine, ethotoin, felbamate, oxcarbazepine, phenytoin, topiramate  modafinil  pioglitazone  rifabutin  rifampin  rifapentine  some medicines to treat HIV infection like atazanavir, efavirenz, indinavir, lopinavir, nelfinavir, tipranavir, ritonavir  St. John's wort  warfarin This list may not describe all possible interactions. Give your health care provider a list of all the medicines, herbs, non-prescription drugs, or dietary supplements you use. Also tell them if you smoke, drink alcohol, or use illegal drugs. Some items may interact with your medicine. What should I watch for while using this medicine? Visit your doctor or health care professional for regular check ups. See your doctor if you or your partner has sexual contact with others, becomes HIV positive, or gets a sexual transmitted disease. This product does not protect you against HIV infection (AIDS) or other sexually transmitted diseases. You can check the placement of the IUD yourself by reaching up to the top of your vagina with clean fingers to feel the threads. Do not pull on the threads. It is a good habit to check placement after each menstrual period. Call your doctor right away if you feel more of the IUD than just the threads or if you cannot feel the threads at   all. The IUD may come out by itself. You may become pregnant if the device comes out. If you notice that the IUD has come out use a backup birth control method like condoms and call your health care provider. Using tampons will not change the position of the  IUD and are okay to use during your period. This IUD can be safely scanned with magnetic resonance imaging (MRI) only under specific conditions. Before you have an MRI, tell your healthcare provider that you have an IUD in place, and which type of IUD you have in place. What side effects may I notice from receiving this medicine? Side effects that you should report to your doctor or health care professional as soon as possible:  allergic reactions like skin rash, itching or hives, swelling of the face, lips, or tongue  fever, flu-like symptoms  genital sores  high blood pressure  no menstrual period for 6 weeks during use  pain, swelling, warmth in the leg  pelvic pain or tenderness  severe or sudden headache  signs of pregnancy  stomach cramping  sudden shortness of breath  trouble with balance, talking, or walking  unusual vaginal bleeding, discharge  yellowing of the eyes or skin Side effects that usually do not require medical attention (report to your doctor or health care professional if they continue or are bothersome):  acne  breast pain  change in sex drive or performance  changes in weight  cramping, dizziness, or faintness while the device is being inserted  headache  irregular menstrual bleeding within first 3 to 6 months of use  nausea This list may not describe all possible side effects. Call your doctor for medical advice about side effects. You may report side effects to FDA at 1-800-FDA-1088. Where should I keep my medicine? This does not apply. NOTE: This sheet is a summary. It may not cover all possible information. If you have questions about this medicine, talk to your doctor, pharmacist, or health care provider.  2020 Elsevier/Gold Standard (2018-01-01 13:22:01)  

## 2019-03-04 ENCOUNTER — Encounter (HOSPITAL_COMMUNITY): Payer: Self-pay | Admitting: Emergency Medicine

## 2019-03-04 ENCOUNTER — Other Ambulatory Visit: Payer: Self-pay

## 2019-03-04 ENCOUNTER — Emergency Department (HOSPITAL_COMMUNITY)
Admission: EM | Admit: 2019-03-04 | Discharge: 2019-03-04 | Disposition: A | Discharge status: Home / Self Care | Payer: BC Managed Care – PPO | Attending: Emergency Medicine | Admitting: Emergency Medicine

## 2019-03-04 DIAGNOSIS — F17210 Nicotine dependence, cigarettes, uncomplicated: Secondary | ICD-10-CM | POA: Insufficient documentation

## 2019-03-04 DIAGNOSIS — K047 Periapical abscess without sinus: Secondary | ICD-10-CM | POA: Diagnosis not present

## 2019-03-04 DIAGNOSIS — K0889 Other specified disorders of teeth and supporting structures: Secondary | ICD-10-CM | POA: Diagnosis not present

## 2019-03-04 MED ORDER — HYDROCODONE-ACETAMINOPHEN 5-325 MG PO TABS: 1.0000 | ORAL_TABLET | ORAL | 0 refills | Status: DC | PRN

## 2019-03-04 MED ORDER — PENICILLIN V POTASSIUM 500 MG PO TABS
500.0000 mg | ORAL_TABLET | Freq: Once | ORAL | Status: AC
  Administered 2019-03-04: 500 mg via ORAL
  Filled 2019-03-04: qty 1

## 2019-03-04 MED ORDER — PENICILLIN V POTASSIUM 500 MG PO TABS: 500.0000 mg | ORAL_TABLET | Freq: Four times a day (QID) | ORAL | 0 refills | Status: AC

## 2019-03-04 MED ORDER — IBUPROFEN 800 MG PO TABS
800.0000 mg | ORAL_TABLET | Freq: Once | ORAL | Status: AC
  Administered 2019-03-04: 800 mg via ORAL
  Filled 2019-03-04: qty 1

## 2019-03-04 NOTE — ED Triage Notes (Signed)
Patient here from home with complaints of left lower dental pain. Tylenol with no relief.

## 2019-03-04 NOTE — Discharge Instructions (Signed)
Follow up with a dentist of your choice for further dental care.   Take medications as prescribed. Add ibuprofen 600 mg (3 over-the-counter tablets) every 6 hours for additional relief.

## 2019-03-04 NOTE — ED Provider Notes (Signed)
[facility]-EMERGENCY DEPT Provider Note   CSN: [id number] Arrival date & time: 03/04/19  0201     History Chief Complaint  Patient presents with  . Dental Pain    Bianca Cantrell is a 33 y.o. female.  Patient to ED with dental pain and left sided facial swelling developing over the last several days. No fever, nausea, headache, sore throat or difficulty swallowing. She reports history of dental abscess that felt the same.   The history is provided by the patient. No language interpreter was used.       Past Medical History:  Diagnosis Date  . Abnormal Pap smear    HPV  . Anemia   . Chlamydia   . Eczema   . Gallstones   . Infection    urinary tract infection  . No pertinent past medical history   . Obesity   . Sickle cell trait [facility])     Patient Active Problem List   Diagnosis Date Noted  . Amenorrhea 10/28/2018  . Abdominal pain, epigastric   . Gastroesophageal reflux disease with esophagitis   . Exposure to STD 01/05/2016  . GERD without esophagitis 02/17/2013  . Condyloma acuminatum 11/19/2012  . Obesity, morbid, BMI 50 or higher (HCC) 05/30/2012  . General counseling for initiation of other contraceptive measures 05/30/2012    Past Surgical History:  Procedure Laterality Date  . APPENDECTOMY  2010  . BIOPSY  11/13/2017   Procedure: BIOPSY;  Surgeon: Benancio Deeds, MD;  Location: [facility];  Service: Gastroenterology;;  . [name]  . ESOPHAGOGASTRODUODENOSCOPY (EGD) WITH PROPOFOL N/A 11/13/2017   Procedure: ESOPHAGOGASTRODUODENOSCOPY (EGD) WITH PROPOFOL;  Surgeon: Benancio Deeds, MD;  Location: [facility];  Service: Gastroenterology;  Laterality: N/A;     OB History    Gravida  4   Para  4   Term  4   Preterm  0   AB  0   Living  4     SAB  0   TAB  0   Ectopic  0   Multiple  0   Live Births  4           Family History  Problem Relation Age of Onset  . Hypertension Mother     . Heart disease Mother   . Hypertension Maternal Grandmother   . Heart disease Maternal Grandmother   . Cancer Maternal Aunt        leukemia  . Cancer Maternal Uncle        pancreatic  . Other Neg Hx   . Colon cancer Neg Hx   . Esophageal cancer Neg Hx     Social History   Tobacco Use  . Smoking status: Current Some Day Smoker    Packs/day: 0.25    Years: 1.00    Pack years: 0.25    Types: Cigarettes  . Smokeless tobacco: Never Used  Substance Use Topics  . Alcohol use: Yes    Comment: 1 bottle liquior on weekends  . Drug use: No    Home Medications Prior to Admission medications   Medication Sig Start Date End Date Taking? Authorizing Provider  acetaminophen (TYLENOL) 500 MG tablet Take 500 mg by mouth every 6 (six) hours as needed for mild pain.     [provider]  methocarbamol (ROBAXIN) 500 MG tablet Take 1 tablet (500 mg total) by mouth 2 (two) times daily. Patient not taking: Reported on 02/13/2019 02/05/19   Tanda Rockers, PA-C  naproxen (NAPROSYN) 500 MG tablet Take 1 tablet (500 mg total) by mouth 2 (two) times daily. Patient not taking: Reported on 02/13/2019 02/05/19   Eustaquio Maize, PA-C  omeprazole (PRILOSEC) 40 MG capsule Take 1 capsule (40 mg total) by mouth 2 (two) times daily. Patient not taking: Reported on 10/09/2018 04/28/18   Chase Picket, MD  OVER THE COUNTER MEDICATION Take 1 tablet by mouth daily. 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  HENT: Positive for dental problem and facial swelling. Negative for sore throat and trouble swallowing.   Respiratory: Negative for shortness of breath.   Gastrointestinal: Negative for nausea.    Physical Exam Updated Vital Signs BP (!) 154/92 (BP Location: Left Wrist)   Pulse (!) 106   Temp 98.6 F (37 C) (Oral)   Resp 12   Ht 4'  10" (1.473 m)   Wt 129.3 kg   SpO2 94%   BMI 59.57 kg/m   Physical Exam Vitals and nursing note reviewed.  Constitutional:      Appearance: Normal appearance.  HENT:     Mouth/Throat:     Pharynx: Oropharynx is clear. No oropharyngeal exudate.     Comments: Mild left maxillary facial swelling. There is swelling and tenderness associated with upper left 2nd molar suspicious for abscess.  Pulmonary:     Effort: Pulmonary effort is normal.  Musculoskeletal:     Cervical back: Normal range of motion and neck supple.  Neurological:     Mental Status: She is alert.     ED Results / Procedures / Treatments   Labs (all labs ordered are listed, but only abnormal results are displayed) Labs Reviewed - No data to display  EKG None  Radiology No results found.  Procedures Procedures (including critical care time)  Medications Ordered in ED Medications - No data to display  ED Course  I have reviewed the triage vital signs and the nursing notes.  Pertinent labs & imaging results that were available during my care of the patient were reviewed by me and considered in my medical decision making (see chart for details).    MDM Rules/Calculators/A&P                      Patient to ED with dental pain and facial swelling found to have exam findings c/w dental abscess.   Will start on abx, provide pain relief and dental resources for follow up. Final Clinical Impression(s) / ED Diagnoses Final diagnoses:  None   1. Dental abscess  Rx / DC Orders ED Discharge Orders    None       Charlann Lange, PA-C 03/04/19 3500    Varney Biles, MD 03/08/19 2220

## 2019-04-17 ENCOUNTER — Ambulatory Visit (INDEPENDENT_AMBULATORY_CARE_PROVIDER_SITE_OTHER): Payer: BC Managed Care – PPO | Admitting: Obstetrics

## 2019-04-17 ENCOUNTER — Encounter: Payer: Self-pay | Admitting: Obstetrics

## 2019-04-17 ENCOUNTER — Other Ambulatory Visit: Payer: Self-pay

## 2019-04-17 VITALS — BP 126/87 | HR 96 | Wt 289.7 lb

## 2019-04-17 DIAGNOSIS — Z3042 Encounter for surveillance of injectable contraceptive: Secondary | ICD-10-CM

## 2019-04-17 DIAGNOSIS — N939 Abnormal uterine and vaginal bleeding, unspecified: Secondary | ICD-10-CM | POA: Diagnosis not present

## 2019-04-17 DIAGNOSIS — Z0000 Encounter for general adult medical examination without abnormal findings: Secondary | ICD-10-CM

## 2019-04-17 NOTE — Progress Notes (Signed)
Subjective:    Bianca Cantrell is a 34 y.o. female who presents for heavy contraceptive management. The patient has  complaints of heavy vaginal bleeding with clots since starting Depo injections in December 2020. The patient is sexually active. Pertinent past medical history: sexually transmitted diseases.  The information documented in the HPI was reviewed and verified.  Menstrual History: OB History    Gravida  4   Para  4   Term  4   Preterm  0   AB  0   Living  4     SAB  0   TAB  0   Ectopic  0   Multiple  0   Live Births  4            No LMP recorded. (Menstrual status: Irregular Periods).   Patient Active Problem List   Diagnosis Date Noted  . Amenorrhea 10/28/2018  . Abdominal pain, epigastric   . Gastroesophageal reflux disease with esophagitis   . Exposure to STD 01/05/2016  . GERD without esophagitis 02/17/2013  . Condyloma acuminatum 11/19/2012  . Obesity, morbid, BMI 50 or higher (HCC) 05/30/2012  . General counseling for initiation of other contraceptive measures 05/30/2012   Past Medical History:  Diagnosis Date  . Abnormal Pap smear    HPV  . Anemia   . Chlamydia   . Eczema   . Gallstones   . Infection    urinary tract infection  . No pertinent past medical history   . Obesity   . Sickle cell trait [facility])     Past Surgical History:  Procedure Laterality Date  . APPENDECTOMY  2010  . BIOPSY  11/13/2017   Procedure: BIOPSY;  Surgeon: Benancio Deeds, MD;  Location: [facility];  Service: Gastroenterology;;  . [name]  . ESOPHAGOGASTRODUODENOSCOPY (EGD) WITH PROPOFOL N/A 11/13/2017   Procedure: ESOPHAGOGASTRODUODENOSCOPY (EGD) WITH PROPOFOL;  Surgeon: Benancio Deeds, MD;  Location: [facility];  Service: Gastroenterology;  Laterality: N/A;     Current Outpatient Medications:  .  acetaminophen (TYLENOL) 500 MG tablet, Take 500 mg by mouth every 6 (six) hours as needed for mild pain. , Disp: , Rfl:  .   HYDROcodone-acetaminophen (NORCO/VICODIN) 5-325 MG tablet, Take 1 tablet by mouth every 4 (four) hours as needed for severe pain., Disp: 5 tablet, Rfl: 0 .  methocarbamol (ROBAXIN) 500 MG tablet, Take 1 tablet (500 mg total) by mouth 2 (two) times daily. (Patient not taking: Reported on 02/13/2019), Disp: 20 tablet, Rfl: 0 .  naproxen (NAPROSYN) 500 MG tablet, Take 1 tablet (500 mg total) by mouth 2 (two) times daily. (Patient not taking: Reported on 02/13/2019), Disp: 30 tablet, Rfl: 0 .  omeprazole (PRILOSEC) 40 MG capsule, Take 1 capsule (40 mg total) by mouth 2 (two) times daily. (Patient not taking: Reported on 10/09/2018), Disp: 60 capsule, Rfl: 3 .  OVER THE COUNTER MEDICATION, Take 1 tablet by mouth daily. 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 Sulfa Antibiotics Anaphylaxis and Hives  . Sulfur Anaphylaxis  . Other Hives    Mayonnaise     Social History   Tobacco Use  . Smoking status: Former Smoker    Packs/day: 0.25    Years: 1.00    Pack years: 0.25    Types: Cigarettes    Quit date: 09/14/2018    Years since quitting: 0.5  . Smokeless tobacco: Never Used  Substance Use Topics  . Alcohol  use: Yes    Comment: 1 bottle liquior on weekends    Family History  Problem Relation Age of Onset  . Hypertension Mother   . Heart disease Mother   . Hypertension Maternal Grandmother   . Heart disease Maternal Grandmother   . Cancer Maternal Aunt        leukemia  . Cancer Maternal Uncle        pancreatic  . Other Neg Hx   . Colon cancer Neg Hx   . Esophageal cancer Neg Hx        Review of Systems Constitutional: negative for weight loss Genitourinary: positive for abnormal menstrual periods, and negative for vaginal discharge or pelvic pain   Objective:   BP 126/87   Pulse 96   Wt 289 lb 11.2 oz (131.4 kg)   BMI 60.55 kg/m   PE:  Pelvic exam deferred due to heavy vaginal bleeding General:   alert  Skin:   no rash or  abnormalities  Lungs:   clear to auscultation bilaterally  Heart:   regular rate and rhythm, S1, S2 normal, no murmur, click, rub or gallop  Breasts:   not examined  Abdomen:  normal findings: no organomegaly, soft, non-tender and no hernia   Lab Review Urine pregnancy test Labs reviewed yes Radiologic studies reviewed no  50% of 20 min visit spent on counseling and coordination of care.    Assessment:    34 y.o., discontinuing Depo-Provera injections due to heavy vaginal bleeding.   Plan:   1. Abnormal uterine bleeding (AUB) Rx: - CBC - Ferritin  2. Encounter for surveillance of injectable contraceptive - AUB with Depo Injections - wants to discontinue Depo Injections and start OCP's for better cycle regulation  3. Obesity, morbid, BMI 50 or higher ([place])  - program of caloric restriction, exercise and behavioral modification recommended Rx: - TSH  4. Routine adult health maintenance Rx: - Ambulatory referral to Internal Medicine - Comprehensive metabolic panel   All questions answered. Blood tests: CBC with diff, Comprehensive metabolic panel, TSH and CMET. Diagnosis explained in detail, including differential. Follow up in 4 weeks. Urinalysis. Urine culture and sensitivity.  Orders Placed This Encounter  Procedures  . CBC  . Ferritin  . TSH  . Ambulatory referral to Internal Medicine    Referral Priority:   Routine    Referral Type:   Consultation    Referral Reason:   Specialty Services Required    Requested Specialty:   Internal Medicine    Number of Visits Requested:   1     Shelly Bombard, MD 04/17/2019 4:47 PM

## 2019-04-17 NOTE — Progress Notes (Signed)
Pt reports she started depo back in December and she has been bleeding off and on since then. Pt reports bleeding through super tampons in under one hour with large clots.

## 2019-04-18 LAB — CBC
Hematocrit: 31.9 % — ABNORMAL LOW (ref 34.0–46.6)
Hemoglobin: 10.6 g/dL — ABNORMAL LOW (ref 11.1–15.9)
MCH: 29.6 pg (ref 26.6–33.0)
MCHC: 33.2 g/dL (ref 31.5–35.7)
MCV: 89 fL (ref 79–97)
Platelets: 291 10*3/uL (ref 150–450)
RBC: 3.58 x10E6/uL — ABNORMAL LOW (ref 3.77–5.28)
RDW: 14.4 % (ref 11.7–15.4)
WBC: 12.3 10*3/uL — ABNORMAL HIGH (ref 3.4–10.8)

## 2019-04-18 LAB — FERRITIN: Ferritin: 132 ng/mL (ref 15–150)

## 2019-04-18 LAB — TSH: TSH: 2.02 u[IU]/mL (ref 0.450–4.500)

## 2019-04-21 ENCOUNTER — Other Ambulatory Visit: Payer: Self-pay | Admitting: Obstetrics

## 2019-04-21 DIAGNOSIS — D508 Other iron deficiency anemias: Secondary | ICD-10-CM

## 2019-04-21 MED ORDER — FERROUS SULFATE 325 (65 FE) MG PO TABS: 325.0000 mg | ORAL_TABLET | Freq: Two times a day (BID) | ORAL | 5 refills | Status: DC

## 2019-04-28 ENCOUNTER — Other Ambulatory Visit: Payer: Self-pay | Admitting: Obstetrics

## 2019-04-28 DIAGNOSIS — Z6841 Body Mass Index (BMI) 40.0 and over, adult: Secondary | ICD-10-CM

## 2019-05-15 ENCOUNTER — Encounter: Payer: Self-pay | Admitting: Obstetrics

## 2019-05-15 ENCOUNTER — Telehealth (INDEPENDENT_AMBULATORY_CARE_PROVIDER_SITE_OTHER): Payer: BC Managed Care – PPO | Admitting: Obstetrics

## 2019-05-15 DIAGNOSIS — N939 Abnormal uterine and vaginal bleeding, unspecified: Secondary | ICD-10-CM

## 2019-05-15 DIAGNOSIS — Z3041 Encounter for surveillance of contraceptive pills: Secondary | ICD-10-CM

## 2019-05-15 DIAGNOSIS — Z6841 Body Mass Index (BMI) 40.0 and over, adult: Secondary | ICD-10-CM

## 2019-05-15 DIAGNOSIS — D500 Iron deficiency anemia secondary to blood loss (chronic): Secondary | ICD-10-CM

## 2019-05-15 MED ORDER — NORETHIN ACE-ETH ESTRAD-FE 1-20 MG-MCG(24) PO CAPS: 1.0000 | ORAL_CAPSULE | Freq: Every day | ORAL | 11 refills | Status: DC

## 2019-05-15 NOTE — Progress Notes (Signed)
Patient ID: Bianca Cantrell, female   DOB: Mar 31, 1985, 34 y.o.   MRN: [id number]  Chief Complaint  Patient presents with  . Follow-up    AUB    HPI Bianca Cantrell is a 34 y.o. female.  History abnormally heavy prolonged periods on Depo Provera injections.  Depo discontinued and OCP's ( [place] ) started.  She presents today for follow up.  She states that periods are now normal. HPI  Past Medical History:  Diagnosis Date  . Abnormal Pap smear    HPV  . Anemia   . Chlamydia   . Eczema   . Gallstones   . Infection    urinary tract infection  . No pertinent past medical history   . Obesity   . Sickle cell trait [facility])     Past Surgical History:  Procedure Laterality Date  . APPENDECTOMY  2010  . BIOPSY  11/13/2017   Procedure: BIOPSY;  Surgeon: Yetta Flock, MD;  Location: [facility];  Service: Gastroenterology;;  . Reece Agar  . ESOPHAGOGASTRODUODENOSCOPY (EGD) WITH PROPOFOL N/A 11/13/2017   Procedure: ESOPHAGOGASTRODUODENOSCOPY (EGD) WITH PROPOFOL;  Surgeon: Yetta Flock, MD;  Location: [facility];  Service: Gastroenterology;  Laterality: N/A;    Family History  Problem Relation Age of Onset  . Hypertension Mother   . Heart disease Mother   . Hypertension Maternal Grandmother   . Heart disease Maternal Grandmother   . Cancer Maternal Aunt        leukemia  . Cancer Maternal Uncle        pancreatic  . Other Neg Hx   . Colon cancer Neg Hx   . Esophageal cancer Neg Hx     Social History Social History   Tobacco Use  . Smoking status: Former Smoker    Packs/day: 0.25    Years: 1.00    Pack years: 0.25    Types: Cigarettes    Quit date: 09/14/2018    Years since quitting: 0.6  . Smokeless tobacco: Never Used  Substance Use Topics  . Alcohol use: Yes    Comment: 1 bottle liquior on weekends  . Drug use: No    Allergies  Allergen Reactions  . Sulfa Antibiotics Anaphylaxis and Hives  . Sulfur Anaphylaxis  . Other Hives     Mayonnaise     Current Outpatient Medications  Medication Sig Dispense Refill  . acetaminophen (TYLENOL) 500 MG tablet Take 500 mg by mouth every 6 (six) hours as needed for mild pain.     . ferrous sulfate 325 (65 FE) MG tablet Take 1 tablet (325 mg total) by mouth 2 (two) times daily with a meal. 60 tablet 5  . HYDROcodone-acetaminophen (NORCO/VICODIN) 5-325 MG tablet Take 1 tablet by mouth every 4 (four) hours as needed for severe pain. 5 tablet 0  . methocarbamol (ROBAXIN) 500 MG tablet Take 1 tablet (500 mg total) by mouth 2 (two) times daily. (Patient not taking: Reported on 02/13/2019) 20 tablet 0  . naproxen (NAPROSYN) 500 MG tablet Take 1 tablet (500 mg total) by mouth 2 (two) times daily. (Patient not taking: Reported on 02/13/2019) 30 tablet 0  . Norethin Ace-Eth Estrad-FE (TAYTULLA) 1-20 MG-MCG(24) CAPS Take 1 capsule by mouth daily before breakfast. 28 capsule 11  . omeprazole (PRILOSEC) 40 MG capsule Take 1 capsule (40 mg total) by mouth 2 (two) times daily. (Patient not taking: Reported on 10/09/2018) 60 capsule 3  . OVER THE COUNTER MEDICATION Take 1 tablet by mouth  daily. IT WORKS - WEIGHT MANAGEMENT     No current facility-administered medications for this visit.    Review of Systems Review of Systems Constitutional: negative for fatigue and weight loss Respiratory: negative for cough and wheezing Cardiovascular: negative for chest pain, fatigue and palpitations Gastrointestinal: negative for abdominal pain and change in bowel habits Genitourinary:negative Integument/breast: negative for nipple discharge Musculoskeletal:negative for myalgias Neurological: negative for gait problems and tremors Behavioral/Psych: negative for abusive relationship, depression Endocrine: negative for temperature intolerance      There were no vitals taken for this visit.  Physical Exam Physical Exam:          General:  Alert and no distress        Respiratory:  Good respiratory  effort        Mental:  Normal mood and affect  The remainder of the physical exam deferred due to the nature of the encounter.   >50% of 15 min visit spent on counseling and coordination of care.   Data Reviewed CBC Ferritin  Assessment     1. Abnormal uterine bleeding (AUB) - resolved on OCP's  2. Iron deficiency anemia due to chronic blood loss - taking iron / vitamins  3. Class 3 severe obesity due to excess calories without serious comorbidity with body mass index (BMI) of 60.0 to 69.9 in adult [facility]) - program of caloric reduction, exercise and behavioral modification recommended  4. Encounter for surveillance of contraceptive pills Rx: - Norethin Ace-Eth Estrad-FE (TAYTULLA) 1-20 MG-MCG(24) CAPS; Take 1 capsule by mouth daily before breakfast.  Dispense: 28 capsule; Refill: 11  ( samples dispensed - 6 packs )    Plan    Follow up in 6 months for Annual / Pap   Meds ordered this encounter  Medications  . Norethin Ace-Eth Estrad-FE (TAYTULLA) 1-20 MG-MCG(24) CAPS    Sig: Take 1 capsule by mouth daily before breakfast.    Dispense:  28 capsule    Refill:  11     Brock Bad, MD 05/15/2019 2:30 PM

## 2019-05-28 ENCOUNTER — Ambulatory Visit: Payer: BC Managed Care – PPO | Admitting: Family

## 2019-05-28 DIAGNOSIS — Z0289 Encounter for other administrative examinations: Secondary | ICD-10-CM

## 2019-09-01 ENCOUNTER — Inpatient Hospital Stay
Admission: RE | Admit: 2019-09-01 | Discharge: 2019-09-01 | Disposition: A | Discharge status: Home / Self Care | Payer: BC Managed Care – PPO | Source: Ambulatory Visit

## 2019-09-01 ENCOUNTER — Telehealth: Payer: BC Managed Care – PPO

## 2019-09-10 ENCOUNTER — Ambulatory Visit: Payer: Self-pay

## 2019-09-10 ENCOUNTER — Ambulatory Visit (HOSPITAL_COMMUNITY): Payer: Self-pay

## 2019-09-12 ENCOUNTER — Other Ambulatory Visit: Payer: Self-pay

## 2019-09-12 ENCOUNTER — Ambulatory Visit: Payer: Self-pay

## 2019-09-12 ENCOUNTER — Ambulatory Visit
Admission: EM | Admit: 2019-09-12 | Discharge: 2019-09-12 | Disposition: A | Discharge status: Home / Self Care | Payer: BC Managed Care – PPO | Attending: Physician Assistant | Admitting: Physician Assistant

## 2019-09-12 DIAGNOSIS — K5900 Constipation, unspecified: Secondary | ICD-10-CM

## 2019-09-12 DIAGNOSIS — R109 Unspecified abdominal pain: Secondary | ICD-10-CM | POA: Diagnosis not present

## 2019-09-12 LAB — POCT URINALYSIS DIP (MANUAL ENTRY)
Bilirubin, UA: NEGATIVE
Blood, UA: NEGATIVE
Glucose, UA: NEGATIVE mg/dL
Ketones, POC UA: NEGATIVE mg/dL
Leukocytes, UA: NEGATIVE
Nitrite, UA: NEGATIVE
Protein Ur, POC: NEGATIVE mg/dL
Spec Grav, UA: 1.02 (ref 1.010–1.025)
Urobilinogen, UA: 0.2 E.U./dL
pH, UA: 5.5 (ref 5.0–8.0)

## 2019-09-12 LAB — POCT URINE PREGNANCY: Preg Test, Ur: NEGATIVE

## 2019-09-12 MED ORDER — POLYETHYLENE GLYCOL 3350 17 G PO PACK: 17.0000 g | PACK | Freq: Every day | ORAL | 0 refills | Status: AC

## 2019-09-12 MED ORDER — OMEPRAZOLE 20 MG PO CPDR: 20.0000 mg | DELAYED_RELEASE_CAPSULE | Freq: Every day | ORAL | 0 refills | Status: AC

## 2019-09-12 NOTE — ED Provider Notes (Signed)
[facility]    CSN: [id number] Arrival date & time: 09/12/19  1203      History   Chief Complaint Chief Complaint  Patient presents with   Appointment   Flank Pain    HPI Bianca Cantrell is a 34 y.o. female.   34 year old female comes in for 1.5 week history of bilateral flank pain. Started to the right back, now bilateral. Pain is intermittent, feels that it is worse on days she drinks less water. Denies association with movement, urination. Had experienced some abdominal pain that radiated from the left flank, denies current abdominal pain. Denies nausea/vomiting. Has occasional dysuria, and has strong urine odor first thing in the morning. Denies frequency, hematuria. Denies vaginal discharge, itching. Denies fever. Denies saddle anesthesia, loss of bladder or bowel control. Does have some constipation with hard stools/straining. States symptoms would improve when increasing water intake. AZO, PCN without relief.      Past Medical History:  Diagnosis Date   Abnormal Pap smear    HPV   Anemia    Chlamydia    Eczema    Gallstones    Infection    urinary tract infection   No pertinent past medical history    Obesity    Sickle cell trait [facility])     Patient Active Problem List   Diagnosis Date Noted   Amenorrhea 10/28/2018   Abdominal pain, epigastric    Gastroesophageal reflux disease with esophagitis    Exposure to STD 01/05/2016   GERD without esophagitis 02/17/2013   Condyloma acuminatum 11/19/2012   Obesity, morbid, BMI 50 or higher (HCC) 05/30/2012   General counseling for initiation of other contraceptive measures 05/30/2012    Past Surgical History:  Procedure Laterality Date   APPENDECTOMY  2010   BIOPSY  11/13/2017   Procedure: BIOPSY;  Surgeon: Benancio Deeds, MD;  Location: [facility];  Service: Gastroenterology;;   CHOLECYSTECTOMY  2011   ESOPHAGOGASTRODUODENOSCOPY (EGD) WITH PROPOFOL N/A 11/13/2017    Procedure: ESOPHAGOGASTRODUODENOSCOPY (EGD) WITH PROPOFOL;  Surgeon: Benancio Deeds, MD;  Location: [facility];  Service: Gastroenterology;  Laterality: N/A;    OB History    Gravida  4   Para  4   Term  4   Preterm  0   AB  0   Living  4     SAB  0   TAB  0   Ectopic  0   Multiple  0   Live Births  4            Home Medications    Prior to Admission medications   Medication Sig Start Date End Date Taking? Authorizing Provider  acetaminophen (TYLENOL) 500 MG tablet Take 500 mg by mouth every 6 (six) hours as needed for mild pain.     [provider]  omeprazole (PRILOSEC) 20 MG capsule Take 1 capsule (20 mg total) by mouth daily. 09/12/19   Cathie Hoops, Anaid Haney V, PA-C  polyethylene glycol (MIRALAX) 17 g packet Take 17 g by mouth daily. 09/12/19   Belinda Fisher, PA-C    Family History Family History  Problem Relation Age of Onset   Hypertension Mother    Heart disease Mother    Hypertension Maternal Grandmother    Heart disease Maternal Grandmother    Cancer Maternal Aunt        leukemia   Cancer Maternal Uncle        pancreatic   Other Neg Hx  Colon cancer Neg Hx    Esophageal cancer Neg Hx     Social History Social History   Tobacco Use   Smoking status: Former Smoker    Packs/day: 0.25    Years: 1.00    Pack years: 0.25    Types: Cigarettes    Quit date: 09/14/2018    Years since quitting: 0.9   Smokeless tobacco: Never Used  Vaping Use   Vaping Use: Never used  Substance Use Topics   Alcohol use: Yes    Comment: 1 bottle liquior on weekends   Drug use: No     Allergies   Sulfa antibiotics, Sulfur, and Other   Review of Systems Review of Systems  Reason unable to perform ROS: See HPI as above.     Physical Exam Triage Vital Signs ED Triage Vitals [09/12/19 1208]  Enc Vitals Group     BP 134/86     Pulse Rate 97     Resp 20     Temp 98.2 F (36.8 C)     Temp Source Oral     SpO2 96 %     Weight       Height      Head Circumference      Peak Flow      Pain Score      Pain Loc      Pain Edu?      Excl. in GC?    No data found.  Updated Vital Signs BP 134/86 (BP Location: Left Arm)    Pulse 97    Temp 98.2 F (36.8 C) (Oral)    Resp 20    LMP  (LMP Unknown)    SpO2 96%   Physical Exam Constitutional:      General: She is not in acute distress.    Appearance: She is well-developed. She is not ill-appearing, toxic-appearing or diaphoretic.  HENT:     Head: Normocephalic and atraumatic.  Eyes:     Conjunctiva/sclera: Conjunctivae normal.     Pupils: Pupils are equal, round, and reactive to light.  Cardiovascular:     Rate and Rhythm: Normal rate and regular rhythm.  Pulmonary:     Effort: Pulmonary effort is normal. No respiratory distress.     Comments: LCTAB Abdominal:     General: Bowel sounds are normal.     Palpations: Abdomen is soft.     Tenderness: There is no abdominal tenderness. There is no right CVA tenderness, left CVA tenderness, guarding or rebound.  Musculoskeletal:     Cervical back: Normal range of motion and neck supple.     Comments: No tenderness to palpation  Skin:    General: Skin is warm and dry.  Neurological:     Mental Status: She is alert and oriented to person, place, and time.  Psychiatric:        Behavior: Behavior normal.        Judgment: Judgment normal.      UC Treatments / Results  Labs (all labs ordered are listed, but only abnormal results are displayed) Labs Reviewed  POCT URINALYSIS DIP (MANUAL ENTRY)  POCT URINE PREGNANCY    EKG   Radiology No results found.  Procedures Procedures (including critical care time)  Medications Ordered in UC Medications - No data to display  Initial Impression / Assessment and Plan / UC Course  I have reviewed the triage vital signs and the nursing notes.  Pertinent labs & imaging results that were available during my  care of the patient were reviewed by me and considered in my  medical decision making (see chart for details).    Urine negative for infection. Flank/back pain not reproducible on palpation, negative CVA tenderness. ? Constipation causing symptoms. Will start miralax. Push fluids. Return precautions given.  Patient at discharge requested refill of omeprazole. States takes for GERD but ran out of medicine. Will refill at this time and have patient follow up with PCP for further refills.   Final Clinical Impressions(s) / UC Diagnoses   Final diagnoses:  Bilateral flank pain  Constipation, unspecified constipation type    ED Prescriptions    Medication Sig Dispense Auth. Provider   polyethylene glycol (MIRALAX) 17 g packet Take 17 g by mouth daily. 14 each Cathie Hoops, Meisha Salone V, PA-C   omeprazole (PRILOSEC) 20 MG capsule Take 1 capsule (20 mg total) by mouth daily. 30 capsule Belinda Fisher, PA-C     PDMP not reviewed this encounter.   Belinda Fisher, PA-C 09/12/19 1329

## 2019-09-12 NOTE — Discharge Instructions (Signed)
Your urine without any infection. Start miralax as directed for constipation. Keep hydrated, urine should be clear to pale yellow in color. Decrease juice, soda, caffeine, alcohol intake for now. If symptoms not improving, follow up with PCP for further evaluation.

## 2019-09-12 NOTE — ED Triage Notes (Addendum)
Pt c/o bilateral flank pain x 1.5 weeks. Pt states she has had periodic painful urination. Is taking AZO at home and had leftover doses of Penicillin that she took without improvement

## 2019-10-23 ENCOUNTER — Other Ambulatory Visit: Payer: Self-pay

## 2019-10-23 ENCOUNTER — Ambulatory Visit
Admission: RE | Admit: 2019-10-23 | Discharge: 2019-10-23 | Disposition: A | Discharge status: Home / Self Care | Payer: BC Managed Care – PPO | Source: Ambulatory Visit | Attending: Emergency Medicine | Admitting: Emergency Medicine

## 2019-10-23 VITALS — BP 126/83 | HR 114 | Temp 103.0°F | Resp 20

## 2019-10-23 DIAGNOSIS — U071 COVID-19: Secondary | ICD-10-CM | POA: Insufficient documentation

## 2019-10-23 LAB — POCT RAPID STREP A (OFFICE): Rapid Strep A Screen: NEGATIVE

## 2019-10-23 LAB — POC SARS CORONAVIRUS 2 AG -  ED: SARS Coronavirus 2 Ag: POSITIVE — AB

## 2019-10-23 MED ORDER — CETIRIZINE HCL 10 MG PO TABS: 10.0000 mg | ORAL_TABLET | Freq: Every day | ORAL | 0 refills | Status: AC

## 2019-10-23 MED ORDER — FLUTICASONE PROPIONATE 50 MCG/ACT NA SUSP: 1.0000 | Freq: Every day | NASAL | 0 refills | Status: AC

## 2019-10-23 MED ORDER — ALBUTEROL SULFATE HFA 108 (90 BASE) MCG/ACT IN AERS: 2.0000 | INHALATION_SPRAY | RESPIRATORY_TRACT | 0 refills | Status: AC | PRN

## 2019-10-23 MED ORDER — ACETAMINOPHEN 325 MG PO TABS
650.0000 mg | ORAL_TABLET | Freq: Once | ORAL | Status: AC
  Administered 2019-10-23: 650 mg via ORAL

## 2019-10-23 MED ORDER — BENZONATATE 100 MG PO CAPS: 100.0000 mg | ORAL_CAPSULE | Freq: Three times a day (TID) | ORAL | 0 refills | Status: AC

## 2019-10-23 NOTE — ED Provider Notes (Signed)
[facility]    CSN: [id number] Arrival date & time: 10/23/19  1645      History   Chief Complaint Chief Complaint  Patient presents with  . Sore Throat    HPI Bianca Cantrell is a 34 y.o. female with history of obesity presenting for 3-day course of sore throat, fever, chills, myalgias/heaviness.  Denies chest pain, difficulty breathing.  No choking, dental pain, ear pain, nasal congestion.  No known sick contacts.  Has not taken anything for relief.    Past Medical History:  Diagnosis Date  . Abnormal Pap smear    HPV  . Anemia   . Chlamydia   . Eczema   . Gallstones   . Infection    urinary tract infection  . No pertinent past medical history   . Obesity   . Sickle cell trait [facility])     Patient Active Problem List   Diagnosis Date Noted  . Amenorrhea 10/28/2018  . Abdominal pain, epigastric   . Gastroesophageal reflux disease with esophagitis   . Exposure to STD 01/05/2016  . GERD without esophagitis 02/17/2013  . Condyloma acuminatum 11/19/2012  . Obesity, morbid, BMI 50 or higher (HCC) 05/30/2012  . General counseling for initiation of other contraceptive measures 05/30/2012    Past Surgical History:  Procedure Laterality Date  . APPENDECTOMY  2010  . BIOPSY  11/13/2017   Procedure: BIOPSY;  Surgeon: Benancio Deeds, MD;  Location: [facility];  Service: Gastroenterology;;  . [name]  . ESOPHAGOGASTRODUODENOSCOPY (EGD) WITH PROPOFOL N/A 11/13/2017   Procedure: ESOPHAGOGASTRODUODENOSCOPY (EGD) WITH PROPOFOL;  Surgeon: Benancio Deeds, MD;  Location: [facility];  Service: Gastroenterology;  Laterality: N/A;    OB History    Gravida  4   Para  4   Term  4   Preterm  0   AB  0   Living  4     SAB  0   TAB  0   Ectopic  0   Multiple  0   Live Births  4            Home Medications    Prior to Admission medications   Medication Sig Start Date End Date Taking? Authorizing Provider    Pseudoeph-Doxylamine-DM-APAP (NYQUIL PO) Take by mouth.   Yes [provider]  acetaminophen (TYLENOL) 500 MG tablet Take 500 mg by mouth every 6 (six) hours as needed for mild pain.     [provider]  albuterol (VENTOLIN HFA) 108 (90 Base) MCG/ACT inhaler Inhale 2 puffs into the lungs every 4 (four) hours as needed for wheezing or shortness of breath. 10/23/19   Hall-Potvin, [place], PA-C  benzonatate (TESSALON) 100 MG capsule Take 1 capsule (100 mg total) by mouth every 8 (eight) hours. 10/23/19   Hall-Potvin, [place], PA-C  cetirizine (ZYRTEC ALLERGY) 10 MG tablet Take 1 tablet (10 mg total) by mouth daily. 10/23/19   Hall-Potvin, [place], PA-C  fluticasone (FLONASE) 50 MCG/ACT nasal spray Place 1 spray into both nostrils daily. 10/23/19   Hall-Potvin, [place], PA-C  omeprazole (PRILOSEC) 20 MG capsule Take 1 capsule (20 mg total) by mouth daily. 09/12/19   Cathie Hoops, Amy V, PA-C  polyethylene glycol (MIRALAX) 17 g packet Take 17 g by mouth daily. 09/12/19   Belinda Fisher, PA-C    Family History Family History  Problem Relation Age of Onset  . Hypertension Mother   . Heart disease Mother   . Hypertension Maternal Grandmother   .  Heart disease Maternal Grandmother   . Cancer Maternal Aunt        leukemia  . Cancer Maternal Uncle        pancreatic  . Other Neg Hx   . Colon cancer Neg Hx   . Esophageal cancer Neg Hx     Social History Social History   Tobacco Use  . Smoking status: Former Smoker    Packs/day: 0.25    Years: 1.00    Pack years: 0.25    Types: Cigarettes    Quit date: 09/14/2018    Years since quitting: 1.1  . Smokeless tobacco: Never Used  Vaping Use  . Vaping Use: Never used  Substance Use Topics  . Alcohol use: Yes    Comment: 1 bottle liquior on weekends  . Drug use: No     Allergies   Sulfa antibiotics, Sulfur, and Other   Review of Systems As per HPI   Physical Exam Triage Vital Signs ED Triage Vitals  Enc Vitals Group     BP  10/23/19 1727 126/83     Pulse Rate 10/23/19 1727 (!) 114     Resp 10/23/19 1727 20     Temp 10/23/19 1727 100.3 F (37.9 C)     Temp Source 10/23/19 1751 Oral     SpO2 10/23/19 1727 92 %     Weight --      Height --      Head Circumference --      Peak Flow --      Pain Score 10/23/19 1748 8     Pain Loc --      Pain Edu? --      Excl. in GC? --    No data found.  Updated Vital Signs BP 126/83 (BP Location: Left Arm)   Pulse (!) 114   Temp (!) 103 F (39.4 C) (Oral)   Resp 20   SpO2 92%   Visual Acuity Right Eye Distance:   Left Eye Distance:   Bilateral Distance:    Right Eye Near:   Left Eye Near:    Bilateral Near:     Physical Exam Constitutional:      General: She is not in acute distress. HENT:     Head: Normocephalic and atraumatic.  Eyes:     General: No scleral icterus.    Pupils: Pupils are equal, round, and reactive to light.  Cardiovascular:     Rate and Rhythm: Normal rate.  Pulmonary:     Effort: Pulmonary effort is normal.  Skin:    Coloration: Skin is not jaundiced or pale.  Neurological:     Mental Status: She is alert and oriented to person, place, and time.      UC Treatments / Results  Labs (all labs ordered are listed, but only abnormal results are displayed) Labs Reviewed  POC SARS CORONAVIRUS 2 AG -  ED - Abnormal; Notable for the following components:      Result Value   SARS Coronavirus 2 Ag Positive (*)    All other components within normal limits  CULTURE, GROUP A STREP [facility])  POCT RAPID STREP A (OFFICE)    EKG   Radiology No results found.  Procedures Procedures (including critical care time)  Medications Ordered in UC Medications  acetaminophen (TYLENOL) tablet 650 mg (650 mg Oral Given 10/23/19 1800)    Initial Impression / Assessment and Plan / UC Course  I have reviewed the triage vital signs and the nursing notes.  Pertinent labs & imaging results that were available during my care of the patient  were reviewed by me and considered in my medical decision making (see chart for details).     Patient is febrile, tachycardic, and with hypoxia compared to baseline.  No acute respiratory distress or toxicity.  Rapid strep negative, culture pending.  Rapid Covid performed: Positive.  Patient to quarantine for total of 10 days from symptom onset and treat supportively as outlined below.  Return precautions discussed, pt verbalized understanding and is agreeable to plan. Final Clinical Impressions(s) / UC Diagnoses   Final diagnoses:  COVID-19 virus infection     Discharge Instructions     It is very important to remember that since you have tested positive for Covid you need to be staying home and quarantining to help keep others safe and healthy in the community.  If you would like further evaluation for persistent or worsening symptoms, you may schedule an E-visit or virtual (video) visit throughout the [facility] app or website.  PLEASE NOTE: If you develop severe chest pain or shortness of breath please go to the ER or call 9-1-1 for further evaluation --> DO NOT schedule electronic or virtual visits for this. Please call our office for further guidance / recommendations as needed.  For information about the Covid vaccine, please visit [email or web address]    ED Prescriptions    Medication Sig Dispense Auth. Provider   cetirizine (ZYRTEC ALLERGY) 10 MG tablet Take 1 tablet (10 mg total) by mouth daily. 30 tablet Hall-Potvin, [place], PA-C   fluticasone (FLONASE) 50 MCG/ACT nasal spray Place 1 spray into both nostrils daily. 16 g Hall-Potvin, [place], PA-C   albuterol (VENTOLIN HFA) 108 (90 Base) MCG/ACT inhaler Inhale 2 puffs into the lungs every 4 (four) hours as needed for wheezing or shortness of breath. 18 g Hall-Potvin, [place], PA-C   benzonatate (TESSALON) 100 MG capsule Take 1 capsule (100 mg total) by mouth every 8 (eight) hours. 21 capsule Hall-Potvin,  [place], PA-C     PDMP not reviewed this encounter.   Odette Fraction [place], [place] 10/23/19 1909

## 2019-10-23 NOTE — Discharge Instructions (Addendum)
It is very important to remember that since you have tested positive for Covid you need to be staying home and quarantining to help keep others safe and healthy in the community.  If you would like further evaluation for persistent or worsening symptoms, you may schedule an E-visit or virtual (video) visit throughout the [facility] app or website.  PLEASE NOTE: If you develop severe chest pain or shortness of breath please go to the ER or call 9-1-1 for further evaluation --> DO NOT schedule electronic or virtual visits for this. Please call our office for further guidance / recommendations as needed.  For information about the Covid vaccine, please visit [email or web address] 

## 2019-10-23 NOTE — ED Triage Notes (Signed)
Sore throat, fever, chills, body feels heavy.

## 2019-10-24 ENCOUNTER — Other Ambulatory Visit: Payer: BC Managed Care – PPO

## 2019-10-24 ENCOUNTER — Telehealth: Payer: Self-pay | Admitting: Infectious Diseases

## 2019-10-24 NOTE — Telephone Encounter (Signed)
Called to Discuss with patient about Covid symptoms and the use of the monoclonal antibody infusion for those with mild to moderate Covid symptoms and at a high risk of hospitalization.     Pt appears to qualify for this infusion due to co-morbid conditions and/or a member of an at-risk group in accordance with the FDA Emergency Use Authorization.    Unable to reach pt    

## 2019-10-26 LAB — CULTURE, GROUP A STREP (THRC)

## 2019-10-27 ENCOUNTER — Emergency Department (HOSPITAL_COMMUNITY): Payer: BC Managed Care – PPO

## 2019-10-27 ENCOUNTER — Encounter (HOSPITAL_COMMUNITY): Payer: Self-pay

## 2019-10-27 ENCOUNTER — Other Ambulatory Visit: Payer: Self-pay

## 2019-10-27 ENCOUNTER — Emergency Department (HOSPITAL_COMMUNITY)
Admission: EM | Admit: 2019-10-27 | Discharge: 2019-10-28 | Disposition: A | Discharge status: Home / Self Care | Payer: BC Managed Care – PPO | Source: Home / Self Care

## 2019-10-27 DIAGNOSIS — U071 COVID-19: Secondary | ICD-10-CM | POA: Diagnosis not present

## 2019-10-27 DIAGNOSIS — Z8616 Personal history of COVID-19: Secondary | ICD-10-CM | POA: Insufficient documentation

## 2019-10-27 DIAGNOSIS — R0602 Shortness of breath: Secondary | ICD-10-CM

## 2019-10-27 DIAGNOSIS — Z5321 Procedure and treatment not carried out due to patient leaving prior to being seen by health care provider: Secondary | ICD-10-CM | POA: Insufficient documentation

## 2019-10-27 DIAGNOSIS — R0902 Hypoxemia: Secondary | ICD-10-CM | POA: Diagnosis not present

## 2019-10-27 LAB — CBC WITH DIFFERENTIAL/PLATELET
Abs Immature Granulocytes: 0.03 10*3/uL (ref 0.00–0.07)
Basophils Absolute: 0 10*3/uL (ref 0.0–0.1)
Basophils Relative: 0 %
Eosinophils Absolute: 0 10*3/uL (ref 0.0–0.5)
Eosinophils Relative: 0 %
HCT: 48.5 % — ABNORMAL HIGH (ref 36.0–46.0)
Hemoglobin: 16.2 g/dL — ABNORMAL HIGH (ref 12.0–15.0)
Immature Granulocytes: 1 %
Lymphocytes Relative: 21 %
Lymphs Abs: 1.2 10*3/uL (ref 0.7–4.0)
MCH: 28.3 pg (ref 26.0–34.0)
MCHC: 33.4 g/dL (ref 30.0–36.0)
MCV: 84.6 fL (ref 80.0–100.0)
Monocytes Absolute: 0.2 10*3/uL (ref 0.1–1.0)
Monocytes Relative: 3 %
Neutro Abs: 4.4 10*3/uL (ref 1.7–7.7)
Neutrophils Relative %: 75 %
Platelets: 143 10*3/uL — ABNORMAL LOW (ref 150–400)
RBC: 5.73 MIL/uL — ABNORMAL HIGH (ref 3.87–5.11)
RDW: 14.6 % (ref 11.5–15.5)
WBC: 5.9 10*3/uL (ref 4.0–10.5)
nRBC: 0 % (ref 0.0–0.2)

## 2019-10-27 LAB — COMPREHENSIVE METABOLIC PANEL
ALT: 95 U/L — ABNORMAL HIGH (ref 0–44)
AST: 89 U/L — ABNORMAL HIGH (ref 15–41)
Albumin: 3.6 g/dL (ref 3.5–5.0)
Alkaline Phosphatase: 51 U/L (ref 38–126)
Anion gap: 13 (ref 5–15)
BUN: 11 mg/dL (ref 6–20)
CO2: 24 mmol/L (ref 22–32)
Calcium: 8.6 mg/dL — ABNORMAL LOW (ref 8.9–10.3)
Chloride: 100 mmol/L (ref 98–111)
Creatinine, Ser: 1.01 mg/dL — ABNORMAL HIGH (ref 0.44–1.00)
GFR calc Af Amer: >60 mL/min (ref >60)
GFR calc non Af Amer: >60 mL/min (ref >60)
Glucose, Bld: 121 mg/dL — ABNORMAL HIGH (ref 70–99)
Potassium: 3.8 mmol/L (ref 3.5–5.1)
Sodium: 137 mmol/L (ref 135–145)
Total Bilirubin: 0.5 mg/dL (ref 0.3–1.2)
Total Protein: 8.3 g/dL — ABNORMAL HIGH (ref 6.5–8.1)

## 2019-10-27 MED ORDER — ACETAMINOPHEN 325 MG PO TABS
650.0000 mg | ORAL_TABLET | Freq: Once | ORAL | Status: DC | PRN
  Filled 2019-10-27: qty 2

## 2019-10-27 NOTE — ED Triage Notes (Signed)
Pt reports dx of covid last Thursday. Pt c/o generalized body aches, cough, shob, and fevers.

## 2019-10-27 NOTE — ED Notes (Signed)
1 set of blood cultures collected as well as a lactic on ice, 1 blue top, 2 LT greens, 2 LAVS and 2 Gold tops

## 2019-10-28 NOTE — ED Notes (Signed)
No answer from lobby  

## 2019-10-30 ENCOUNTER — Other Ambulatory Visit: Payer: Self-pay

## 2019-10-30 ENCOUNTER — Ambulatory Visit
Admission: RE | Admit: 2019-10-30 | Discharge: 2019-10-30 | Disposition: A | Discharge status: Home / Self Care | Payer: BC Managed Care – PPO | Source: Ambulatory Visit | Attending: Family Medicine | Admitting: Family Medicine

## 2019-10-30 ENCOUNTER — Inpatient Hospital Stay (HOSPITAL_COMMUNITY)
Admission: EM | Admit: 2019-10-30 | Discharge: 2019-12-05 | DRG: 208 | Disposition: E | Payer: BC Managed Care – PPO | Attending: Pulmonary Disease | Admitting: Pulmonary Disease

## 2019-10-30 ENCOUNTER — Emergency Department (HOSPITAL_COMMUNITY): Payer: BC Managed Care – PPO

## 2019-10-30 DIAGNOSIS — E781 Pure hyperglyceridemia: Secondary | ICD-10-CM | POA: Diagnosis present

## 2019-10-30 DIAGNOSIS — J9601 Acute respiratory failure with hypoxia: Secondary | ICD-10-CM | POA: Diagnosis present

## 2019-10-30 DIAGNOSIS — Z87891 Personal history of nicotine dependence: Secondary | ICD-10-CM

## 2019-10-30 DIAGNOSIS — R0902 Hypoxemia: Secondary | ICD-10-CM

## 2019-10-30 DIAGNOSIS — E871 Hypo-osmolality and hyponatremia: Secondary | ICD-10-CM | POA: Diagnosis present

## 2019-10-30 DIAGNOSIS — R739 Hyperglycemia, unspecified: Secondary | ICD-10-CM | POA: Diagnosis not present

## 2019-10-30 DIAGNOSIS — U071 COVID-19: Principal | ICD-10-CM | POA: Diagnosis present

## 2019-10-30 DIAGNOSIS — Z882 Allergy status to sulfonamides status: Secondary | ICD-10-CM | POA: Diagnosis not present

## 2019-10-30 DIAGNOSIS — J1282 Pneumonia due to coronavirus disease 2019: Secondary | ICD-10-CM | POA: Diagnosis present

## 2019-10-30 DIAGNOSIS — Z8249 Family history of ischemic heart disease and other diseases of the circulatory system: Secondary | ICD-10-CM

## 2019-10-30 DIAGNOSIS — N179 Acute kidney failure, unspecified: Secondary | ICD-10-CM | POA: Diagnosis present

## 2019-10-30 DIAGNOSIS — K2100 Gastro-esophageal reflux disease with esophagitis, without bleeding: Secondary | ICD-10-CM | POA: Diagnosis present

## 2019-10-30 DIAGNOSIS — I2699 Other pulmonary embolism without acute cor pulmonale: Secondary | ICD-10-CM | POA: Diagnosis not present

## 2019-10-30 DIAGNOSIS — E876 Hypokalemia: Secondary | ICD-10-CM | POA: Diagnosis not present

## 2019-10-30 DIAGNOSIS — E870 Hyperosmolality and hypernatremia: Secondary | ICD-10-CM | POA: Diagnosis not present

## 2019-10-30 DIAGNOSIS — Z800 Family history of malignant neoplasm of digestive organs: Secondary | ICD-10-CM

## 2019-10-30 DIAGNOSIS — J80 Acute respiratory distress syndrome: Secondary | ICD-10-CM | POA: Diagnosis present

## 2019-10-30 DIAGNOSIS — R579 Shock, unspecified: Secondary | ICD-10-CM | POA: Diagnosis not present

## 2019-10-30 DIAGNOSIS — D573 Sickle-cell trait: Secondary | ICD-10-CM | POA: Diagnosis present

## 2019-10-30 DIAGNOSIS — J939 Pneumothorax, unspecified: Secondary | ICD-10-CM | POA: Diagnosis not present

## 2019-10-30 DIAGNOSIS — R7989 Other specified abnormal findings of blood chemistry: Secondary | ICD-10-CM

## 2019-10-30 DIAGNOSIS — I2109 ST elevation (STEMI) myocardial infarction involving other coronary artery of anterior wall: Secondary | ICD-10-CM | POA: Diagnosis not present

## 2019-10-30 DIAGNOSIS — F419 Anxiety disorder, unspecified: Secondary | ICD-10-CM | POA: Diagnosis not present

## 2019-10-30 DIAGNOSIS — J9690 Respiratory failure, unspecified, unspecified whether with hypoxia or hypercapnia: Secondary | ICD-10-CM

## 2019-10-30 DIAGNOSIS — Z6841 Body Mass Index (BMI) 40.0 and over, adult: Secondary | ICD-10-CM | POA: Diagnosis not present

## 2019-10-30 DIAGNOSIS — Z79899 Other long term (current) drug therapy: Secondary | ICD-10-CM

## 2019-10-30 DIAGNOSIS — Z9911 Dependence on respirator [ventilator] status: Secondary | ICD-10-CM

## 2019-10-30 DIAGNOSIS — Z978 Presence of other specified devices: Secondary | ICD-10-CM

## 2019-10-30 DIAGNOSIS — Z806 Family history of leukemia: Secondary | ICD-10-CM | POA: Diagnosis not present

## 2019-10-30 DIAGNOSIS — E861 Hypovolemia: Secondary | ICD-10-CM | POA: Diagnosis present

## 2019-10-30 DIAGNOSIS — E875 Hyperkalemia: Secondary | ICD-10-CM | POA: Diagnosis not present

## 2019-10-30 DIAGNOSIS — A0839 Other viral enteritis: Secondary | ICD-10-CM | POA: Diagnosis present

## 2019-10-30 DIAGNOSIS — M7989 Other specified soft tissue disorders: Secondary | ICD-10-CM | POA: Diagnosis not present

## 2019-10-30 DIAGNOSIS — R438 Other disturbances of smell and taste: Secondary | ICD-10-CM | POA: Diagnosis present

## 2019-10-30 LAB — I-STAT ARTERIAL BLOOD GAS, ED
Acid-Base Excess: 3 mmol/L — ABNORMAL HIGH (ref 0.0–2.0)
Bicarbonate: 26.6 mmol/L (ref 20.0–28.0)
Calcium, Ion: 1.07 mmol/L — ABNORMAL LOW (ref 1.15–1.40)
HCT: 48 % — ABNORMAL HIGH (ref 36.0–46.0)
Hemoglobin: 16.3 g/dL — ABNORMAL HIGH (ref 12.0–15.0)
O2 Saturation: 99 %
Patient temperature: 103
Potassium: 4 mmol/L (ref 3.5–5.1)
Sodium: 133 mmol/L — ABNORMAL LOW (ref 135–145)
TCO2: 28 mmol/L (ref 22–32)
pCO2 arterial: 41.6 mmHg (ref 32.0–48.0)
pH, Arterial: 7.423 (ref 7.350–7.450)
pO2, Arterial: 128 mmHg — ABNORMAL HIGH (ref 83.0–108.0)

## 2019-10-30 LAB — COMPREHENSIVE METABOLIC PANEL
ALT: 76 U/L — ABNORMAL HIGH (ref 0–44)
AST: 169 U/L — ABNORMAL HIGH (ref 15–41)
Albumin: 1.9 g/dL — ABNORMAL LOW (ref 3.5–5.0)
Alkaline Phosphatase: 48 U/L (ref 38–126)
Anion gap: 12 (ref 5–15)
BUN: 33 mg/dL — ABNORMAL HIGH (ref 6–20)
CO2: 24 mmol/L (ref 22–32)
Calcium: 8 mg/dL — ABNORMAL LOW (ref 8.9–10.3)
Chloride: 97 mmol/L — ABNORMAL LOW (ref 98–111)
Creatinine, Ser: 1.8 mg/dL — ABNORMAL HIGH (ref 0.44–1.00)
GFR calc Af Amer: 42 mL/min — ABNORMAL LOW (ref >60)
GFR calc non Af Amer: 37 mL/min — ABNORMAL LOW (ref >60)
Glucose, Bld: 127 mg/dL — ABNORMAL HIGH (ref 70–99)
Potassium: 4.6 mmol/L (ref 3.5–5.1)
Sodium: 133 mmol/L — ABNORMAL LOW (ref 135–145)
Total Bilirubin: 0.2 mg/dL — ABNORMAL LOW (ref 0.3–1.2)
Total Protein: 6.7 g/dL (ref 6.5–8.1)

## 2019-10-30 LAB — D-DIMER, QUANTITATIVE: D-Dimer, Quant: 3.93 ug/mL-FEU — ABNORMAL HIGH (ref 0.00–0.50)

## 2019-10-30 LAB — TRIGLYCERIDES
Triglycerides: 297 mg/dL — ABNORMAL HIGH (ref <150)
Triglycerides: 308 mg/dL — ABNORMAL HIGH (ref <150)

## 2019-10-30 LAB — CBC WITH DIFFERENTIAL/PLATELET
Abs Immature Granulocytes: 0 10*3/uL (ref 0.00–0.07)
Basophils Absolute: 0 10*3/uL (ref 0.0–0.1)
Basophils Relative: 0 %
Eosinophils Absolute: 0 10*3/uL (ref 0.0–0.5)
Eosinophils Relative: 0 %
HCT: 50.6 % — ABNORMAL HIGH (ref 36.0–46.0)
Hemoglobin: 16.7 g/dL — ABNORMAL HIGH (ref 12.0–15.0)
Lymphocytes Relative: 6 %
Lymphs Abs: 0.4 10*3/uL — ABNORMAL LOW (ref 0.7–4.0)
MCH: 27.5 pg (ref 26.0–34.0)
MCHC: 33 g/dL (ref 30.0–36.0)
MCV: 83.2 fL (ref 80.0–100.0)
Monocytes Absolute: 0.1 10*3/uL (ref 0.1–1.0)
Monocytes Relative: 1 %
Neutro Abs: 6.1 10*3/uL (ref 1.7–7.7)
Neutrophils Relative %: 93 %
Platelets: 143 10*3/uL — ABNORMAL LOW (ref 150–400)
RBC: 6.08 MIL/uL — ABNORMAL HIGH (ref 3.87–5.11)
RDW: 14.6 % (ref 11.5–15.5)
WBC: 6.6 10*3/uL (ref 4.0–10.5)
nRBC: 0 % (ref 0.0–0.2)
nRBC: 0 /100 WBC

## 2019-10-30 LAB — LACTIC ACID, PLASMA: Lactic Acid, Venous: 1.8 mmol/L (ref 0.5–1.9)

## 2019-10-30 LAB — LACTATE DEHYDROGENASE: LDH: 1192 U/L — ABNORMAL HIGH (ref 98–192)

## 2019-10-30 LAB — I-STAT BETA HCG BLOOD, ED (MC, WL, AP ONLY): I-stat hCG, quantitative: <5 m[IU]/mL (ref <5)

## 2019-10-30 LAB — C-REACTIVE PROTEIN: CRP: 2.1 mg/dL — ABNORMAL HIGH (ref <1.0)

## 2019-10-30 LAB — FIBRINOGEN: Fibrinogen: 715 mg/dL — ABNORMAL HIGH (ref 210–475)

## 2019-10-30 LAB — FERRITIN: Ferritin: 1373 ng/mL — ABNORMAL HIGH (ref 11–307)

## 2019-10-30 LAB — PROCALCITONIN: Procalcitonin: 1.02 ng/mL

## 2019-10-30 MED ORDER — POLYETHYLENE GLYCOL 3350 17 G PO PACK
17.0000 g | PACK | Freq: Every day | ORAL | Status: DC | PRN
  Administered 2019-11-07: 17 g via ORAL

## 2019-10-30 MED ORDER — DEXAMETHASONE SODIUM PHOSPHATE 10 MG/ML IJ SOLN
6.0000 mg | Freq: Once | INTRAMUSCULAR | Status: AC
  Administered 2019-10-30: 6 mg via INTRAVENOUS
  Filled 2019-10-30: qty 1

## 2019-10-30 MED ORDER — ACETAMINOPHEN 325 MG PO TABS
650.0000 mg | ORAL_TABLET | Freq: Four times a day (QID) | ORAL | Status: DC | PRN
  Administered 2019-11-04 – 2019-11-05 (×2): 650 mg via ORAL
  Filled 2019-10-30 (×2): qty 2

## 2019-10-30 MED ORDER — ALBUTEROL SULFATE HFA 108 (90 BASE) MCG/ACT IN AERS
2.0000 | INHALATION_SPRAY | RESPIRATORY_TRACT | Status: DC | PRN
  Administered 2019-11-03 (×2): 2 via RESPIRATORY_TRACT
  Filled 2019-10-30: qty 6.7

## 2019-10-30 MED ORDER — SODIUM CHLORIDE 0.9 % IV SOLN: 100.0000 mg | Freq: Every day | INTRAVENOUS | Status: DC

## 2019-10-30 MED ORDER — ONDANSETRON HCL 4 MG/2ML IJ SOLN: 4.0000 mg | Freq: Four times a day (QID) | INTRAMUSCULAR | Status: DC | PRN

## 2019-10-30 MED ORDER — MIDAZOLAM HCL 2 MG/2ML IJ SOLN: 2.0000 mg | Freq: Once | INTRAMUSCULAR | Status: DC

## 2019-10-30 MED ORDER — SODIUM CHLORIDE 0.9 % IV SOLN
200.0000 mg | Freq: Once | INTRAVENOUS | Status: AC
  Administered 2019-10-30: 200 mg via INTRAVENOUS
  Filled 2019-10-30: qty 200

## 2019-10-30 MED ORDER — SODIUM CHLORIDE 0.9 % IV SOLN: INTRAVENOUS | Status: AC

## 2019-10-30 MED ORDER — ONDANSETRON HCL 4 MG PO TABS: 4.0000 mg | ORAL_TABLET | Freq: Four times a day (QID) | ORAL | Status: DC | PRN

## 2019-10-30 MED ORDER — METHYLPREDNISOLONE SODIUM SUCC 125 MG IJ SOLR
0.5000 mg/kg | Freq: Two times a day (BID) | INTRAMUSCULAR | Status: DC
  Administered 2019-10-30: 64.375 mg via INTRAVENOUS
  Filled 2019-10-30: qty 2

## 2019-10-30 MED ORDER — ENOXAPARIN SODIUM 80 MG/0.8ML ~~LOC~~ SOLN
65.0000 mg | SUBCUTANEOUS | Status: DC
  Administered 2019-10-30 – 2019-11-07 (×9): 65 mg via SUBCUTANEOUS
  Filled 2019-10-30 (×10): qty 0.65

## 2019-10-30 MED ORDER — SODIUM CHLORIDE 0.9 % IV SOLN: 200.0000 mg | Freq: Once | INTRAVENOUS | Status: DC

## 2019-10-30 MED ORDER — BENZONATATE 100 MG PO CAPS
100.0000 mg | ORAL_CAPSULE | Freq: Three times a day (TID) | ORAL | Status: DC
  Administered 2019-10-30 – 2019-10-31 (×2): 100 mg via ORAL
  Filled 2019-10-30 (×3): qty 1

## 2019-10-30 MED ORDER — ACETAMINOPHEN 650 MG RE SUPP
650.0000 mg | Freq: Once | RECTAL | Status: AC
  Administered 2019-10-30: 650 mg via RECTAL
  Filled 2019-10-30: qty 1

## 2019-10-30 MED ORDER — SODIUM CHLORIDE 0.9 % IV BOLUS
1000.0000 mL | Freq: Once | INTRAVENOUS | Status: AC
  Administered 2019-10-30: 1000 mL via INTRAVENOUS

## 2019-10-30 MED ORDER — SODIUM CHLORIDE 0.9 % IV SOLN
100.0000 mg | Freq: Every day | INTRAVENOUS | Status: DC
  Filled 2019-10-30 (×2): qty 20

## 2019-10-30 MED ORDER — BARICITINIB 2 MG PO TABS
2.0000 mg | ORAL_TABLET | Freq: Every day | ORAL | Status: DC
  Administered 2019-10-30 – 2019-11-04 (×5): 2 mg via ORAL
  Filled 2019-10-30 (×8): qty 1

## 2019-10-30 MED ORDER — ACETAMINOPHEN 500 MG PO TABS: 500.0000 mg | ORAL_TABLET | Freq: Four times a day (QID) | ORAL | Status: DC | PRN

## 2019-10-30 NOTE — Consult Note (Signed)
NAME:  Bianca Cantrell, MRN:  [id number], DOB:  03/18/1985, LOS: 0 ADMISSION DATE:  19-Nov-2019, CONSULTATION DATE:  11-19-2019 REFERRING MD:  Hyman Hopes [facility] - ED [facility], CHIEF COMPLAINT:  Dyspnea.    HPI/course in hospital  34 year old woman unvaccinated for COVID. 9 day history of fever, myalgias and increasing dyspnea.   Presented today to urgent care but found to be hypoxic with saturations into the 70's with severe distress.  Brought to ED and started on BIPAP.  Past Medical History   Past Medical History:  Diagnosis Date  . Abnormal Pap smear    HPV  . Anemia   . Chlamydia   . Eczema   . Gallstones   . Infection    urinary tract infection  . No pertinent past medical history   . Obesity   . Sickle cell trait [facility])      Past Surgical History:  Procedure Laterality Date  . APPENDECTOMY  2010  . BIOPSY  11/13/2017   Procedure: BIOPSY;  Surgeon: Benancio Deeds, MD;  Location: [facility];  Service: Gastroenterology;;  . [name]  . ESOPHAGOGASTRODUODENOSCOPY (EGD) WITH PROPOFOL N/A 11/13/2017   Procedure: ESOPHAGOGASTRODUODENOSCOPY (EGD) WITH PROPOFOL;  Surgeon: Benancio Deeds, MD;  Location: [facility];  Service: Gastroenterology;  Laterality: N/A;     Review of Systems:   Review of Systems  Constitutional: Positive for fever and malaise/fatigue.  HENT:       Loss of taste and smell  Eyes: Negative.   Respiratory: Positive for shortness of breath.   Cardiovascular: Negative.   Gastrointestinal: Positive for nausea.  Genitourinary: Negative.   Musculoskeletal: Positive for back pain and myalgias.  Neurological: Negative.   Endo/Heme/Allergies: Negative.   Psychiatric/Behavioral: Negative.     Social History   reports that she quit smoking about 13 months ago. Her smoking use included cigarettes. She has a 0.25 pack-year smoking history. She has never used smokeless tobacco. She reports current alcohol use. She reports that she does not use  drugs.   Family History   Her family history includes Cancer in her maternal aunt and maternal uncle; Heart disease in her maternal grandmother and mother; Hypertension in her maternal grandmother and mother. There is no history of Other, Colon cancer, or Esophageal cancer.   Allergies Allergies  Allergen Reactions  . Sulfa Antibiotics Anaphylaxis and Hives  . Sulfur Anaphylaxis  . Other Hives    Mayonnaise      Home Medications  Prior to Admission medications   Medication Sig Start Date End Date Taking? Authorizing Provider  acetaminophen (TYLENOL) 500 MG tablet Take 500 mg by mouth every 6 (six) hours as needed for mild pain.     [provider]  albuterol (VENTOLIN HFA) 108 (90 Base) MCG/ACT inhaler Inhale 2 puffs into the lungs every 4 (four) hours as needed for wheezing or shortness of breath. 10/23/19   Hall-Potvin, [place], PA-C  benzonatate (TESSALON) 100 MG capsule Take 1 capsule (100 mg total) by mouth every 8 (eight) hours. 10/23/19   Hall-Potvin, [place], PA-C  cetirizine (ZYRTEC ALLERGY) 10 MG tablet Take 1 tablet (10 mg total) by mouth daily. 10/23/19   Hall-Potvin, [place], PA-C  fluticasone (FLONASE) 50 MCG/ACT nasal spray Place 1 spray into both nostrils daily. 10/23/19   Hall-Potvin, [place], PA-C  omeprazole (PRILOSEC) 20 MG capsule Take 1 capsule (20 mg total) by mouth daily. 09/12/19   Cathie Hoops, Amy V, PA-C  polyethylene glycol (MIRALAX) 17 g packet Take 17 g  by mouth daily. 09/12/19   Yu, Amy V, PA-C  Pseudoeph-Doxylamine-DM-APAP (NYQUIL PO) Take by mouth.    [provider]     Interim history/subjective:  Some improvement in dyspnea on BiPAP  Objective   Blood pressure (!) 135/28, pulse (!) 120, temperature (!) 103 F (39.4 C), temperature source Oral, height 4\' 10"  (1.473 m), weight 129 kg, SpO2 90 %.    Vent Mode: BIPAP;PCV FiO2 (%):  [60 %-100 %] 100 % Set Rate:  [12 bmp] 12 bmp PEEP:  [8 cmH20-10 cmH20] 10 cmH20  No intake or output data in  the 24 hours ending 24-Nov-2019 1754 Filed Weights   11/24/2019 1517  Weight: 129 kg    Examination: Physical Exam Constitutional:      Appearance: She is obese.  HENT:     Head: Normocephalic and atraumatic.     Mouth/Throat:     Mouth: Mucous membranes are moist.  Eyes:     Pupils: Pupils are equal, round, and reactive to light.  Neck:     Vascular: No JVD.     Trachea: No tracheal deviation.  Cardiovascular:     Rate and Rhythm: Regular rhythm. Tachycardia present.     Heart sounds: No murmur heard.  No gallop.   Pulmonary:     Effort: Tachypnea, accessory muscle usage and respiratory distress present.     Breath sounds: Rales present.  Abdominal:     Palpations: Abdomen is soft.  Skin:    General: Skin is warm and dry.  Neurological:     General: No focal deficit present.     Mental Status: She is alert.      Ancillary tests (personally reviewed)  CBC: Recent Labs  Lab 10/27/19 2306 11/24/19 1553 11-24-2019 1555  WBC 5.9  --  6.6  NEUTROABS 4.4  --  6.1  HGB 16.2* 16.3* 16.7*  HCT 48.5* 48.0* 50.6*  MCV 84.6  --  83.2  PLT 143*  --  143*    Basic Metabolic Panel: Recent Labs  Lab 10/27/19 2306 November 24, 2019 1553 November 24, 2019 1555  NA 137 133* 133*  K 3.8 4.0 4.6  CL 100  --  97*  CO2 24  --  24  GLUCOSE 121*  --  127*  BUN 11  --  33*  CREATININE 1.01*  --  1.80*  CALCIUM 8.6*  --  8.0*   GFR: Estimated Creatinine Clearance: 53.9 mL/min (A) (by C-G formula based on SCr of 1.8 mg/dL (H)). Recent Labs  Lab 10/27/19 2306 11-24-19 1555 Nov 24, 2019 1606  PROCALCITON  --  1.02  --   WBC 5.9 6.6  --   LATICACIDVEN  --   --  1.8    Liver Function Tests: Recent Labs  Lab 10/27/19 2306 Nov 24, 2019 1555  AST 89* 169*  ALT 95* 76*  ALKPHOS 51 48  BILITOT 0.5 0.2*  PROT 8.3* 6.7  ALBUMIN 3.6 1.9*   No results for input(s): LIPASE, AMYLASE in the last 168 hours. No results for input(s): AMMONIA in the last 168 hours.  ABG    Component Value Date/Time    PHART 7.423 11/24/19 1553   PCO2ART 41.6 2019-11-24 1553   PO2ART 128 (H) 2019/11/24 1553   HCO3 26.6 2019-11-24 1553   TCO2 28 24-Nov-2019 1553   O2SAT 99.0 11/24/2019 1553     Coagulation Profile: No results for input(s): INR, PROTIME in the last 168 hours.  Cardiac Enzymes: No results for input(s): CKTOTAL, CKMB, CKMBINDEX, TROPONINI in the last  168 hours.  HbA1C: No results found for: HGBA1C  CBG: No results for input(s): GLUCAP in the last 168 hours.   Assessment & Plan:   Acute hypoxic respiratory failure due to COVID 19 pneumonia.  Morbid obesity.  Plan:  Given hypoxic respiratory failure most appropriate to try HHFNC. Initiate COVID - 19  Therapy.   ON REASSESSMENT:  Patient is more comfortable and speaking in full sentences on [facility] - appropriate for Hospitalist admission.  Lynnell Catalan, MD [facility] ICU Physician [facility] Critical [facility]  Pager: [phone] Mobile: [phone] After hours: [phone].  10/08/2019, 5:54 PM

## 2019-10-30 NOTE — Progress Notes (Signed)
Patient arrived via EMS, was on CPAP in truck. On NRB upon arrival. Placed on BiPAP 16/8 and 60%

## 2019-10-30 NOTE — ED Triage Notes (Signed)
Pt brought in by EMS with c/o SOB. Pt attempted to be seen at urgent care. Pt could not get out of car d/t increased SOB. RN assessed in parking lot ApO2 70% and placed on NRB. EMS on scene SpO2 80%. RR increased to 60/min  125mg   solumedral  2g Mag .3 epi 1/1 4mg  zofran''  200mg  NS  82/37 after mag administered 134/62 HR 117 RR 55

## 2019-10-30 NOTE — ED Provider Notes (Signed)
[facility] EMERGENCY DEPARTMENT Provider Note   CSN: [id number] Arrival date & time: 2019-11-25  1509     History Chief Complaint  Patient presents with  . Shortness of Breath    Bianca Cantrell is a 34 y.o. female with PMHx obesity and sickle cell trait who presents to the ED today via EMS with SOB. Triage report patient attempted to be seen in urgent care, she could not get out of the car due to increase shortness of breath. RN assessed patient in parking lot with oxygen saturation 70% and placed on nonrebreather. When EMS arrived patient's oxygen saturation was 80% she was brought to the ED for further eval. She was provided 125 mg Solu-Medrol, 2 mg of mag, 4 mg Zofran, and 200 mg normal saline. Of note patient seen in urgent care on 08/19 for sore throat but tested positive for Covid. It appears she attempted to go to [facility] long emergency department a couple of days later for worsening symptoms however left prior to being seen. Patient reports he began feeling ill last week however took a turn for the worse earlier this week. She is unvaccinated.   The history is provided by the patient and medical records.       Past Medical History:  Diagnosis Date  . Abnormal Pap smear    HPV  . Anemia   . Chlamydia   . Eczema   . Gallstones   . Infection    urinary tract infection  . No pertinent past medical history   . Obesity   . Sickle cell trait [facility]     Patient Active Problem List   Diagnosis Date Noted  . Acute respiratory failure with hypoxia (HCC) 2019-11-25  . Amenorrhea 10/28/2018  . Abdominal pain, epigastric   . Gastroesophageal reflux disease with esophagitis   . Exposure to STD 01/05/2016  . GERD without esophagitis 02/17/2013  . Condyloma acuminatum 11/19/2012  . Obesity, morbid, BMI 50 or higher (HCC) 05/30/2012  . General counseling for initiation of other contraceptive measures 05/30/2012    Past Surgical History:  Procedure Laterality  Date  . APPENDECTOMY  2010  . BIOPSY  11/13/2017   Procedure: BIOPSY;  Surgeon: Benancio Deeds, MD;  Location[facility];  Service: Gastroenterology;;  . [name]  . ESOPHAGOGASTRODUODENOSCOPY (EGD) WITH PROPOFOL N/A 11/13/2017   Procedure: ESOPHAGOGASTRODUODENOSCOPY (EGD) WITH PROPOFOL;  Surgeon: Benancio Deeds, MD;  Location[facility];  Service: Gastroenterology;  Laterality: N/A;     OB History    Gravida  4   Para  4   Term  4   Preterm  0   AB  0   Living  4     SAB  0   TAB  0   Ectopic  0   Multiple  0   Live Births  4           Family History  Problem Relation Age of Onset  . Hypertension Mother   . Heart disease Mother   . Hypertension Maternal Grandmother   . Heart disease Maternal Grandmother   . Cancer Maternal Aunt        leukemia  . Cancer Maternal Uncle        pancreatic  . Other Neg Hx   . Colon cancer Neg Hx   . Esophageal cancer Neg Hx     Social History   Tobacco Use  . Smoking status: Former Smoker    Packs/day: 0.25  Years: 1.00    Pack years: 0.25    Types: Cigarettes    Quit date: 09/14/2018    Years since quitting: 1.1  . Smokeless tobacco: Never Used  Vaping Use  . Vaping Use: Never used  Substance Use Topics  . Alcohol use: Yes    Comment: 1 bottle liquior on weekends  . Drug use: No    Home Medications Prior to Admission medications   Medication Sig Start Date End Date Taking? Authorizing Provider  acetaminophen (TYLENOL) 500 MG tablet Take 500 mg by mouth every 6 (six) hours as needed for mild pain.     [provider]  albuterol (VENTOLIN HFA) 108 (90 Base) MCG/ACT inhaler Inhale 2 puffs into the lungs every 4 (four) hours as needed for wheezing or shortness of breath. 10/23/19   Hall-Potvin, [place], PA-C  benzonatate (TESSALON) 100 MG capsule Take 1 capsule (100 mg total) by mouth every 8 (eight) hours. 10/23/19   Hall-Potvin, [place], PA-C  cetirizine (ZYRTEC ALLERGY)  10 MG tablet Take 1 tablet (10 mg total) by mouth daily. 10/23/19   Hall-Potvin, [place], PA-C  fluticasone (FLONASE) 50 MCG/ACT nasal spray Place 1 spray into both nostrils daily. 10/23/19   Hall-Potvin, [place], PA-C  omeprazole (PRILOSEC) 20 MG capsule Take 1 capsule (20 mg total) by mouth daily. 09/12/19   Cathie Hoops, Amy V, PA-C  polyethylene glycol (MIRALAX) 17 g packet Take 17 g by mouth daily. 09/12/19   Yu, Amy V, PA-C  Pseudoeph-Doxylamine-DM-APAP (NYQUIL PO) Take by mouth.    [provider]    Allergies    Sulfa antibiotics, Sulfur, and Other  Review of Systems   Review of Systems  Constitutional: Positive for chills, fatigue and fever.  Respiratory: Positive for cough and shortness of breath.   Cardiovascular: Negative for chest pain.  All other systems reviewed and are negative.   Physical Exam Updated Vital Signs BP (!) 135/28 (BP Location: Right Arm)   Pulse (!) 120   Temp (!) 103 F (39.4 C) (Oral)   Ht 4\' 10"  (1.473 m)   Wt 129 kg   SpO2 90%   BMI 59.44 kg/m   Physical Exam Vitals and nursing note reviewed.  Constitutional:      Appearance: She is obese. She is ill-appearing.  HENT:     Head: Normocephalic and atraumatic.  Eyes:     Conjunctiva/sclera: Conjunctivae normal.  Cardiovascular:     Rate and Rhythm: Regular rhythm. Tachycardia present.  Pulmonary:     Effort: Tachypnea present.     Breath sounds: Decreased breath sounds present.     Comments: In respiratory distress. On BiPAP. Able to speak in very short sentences however tachypneic with RR in the 40s and satting 90%.  Abdominal:     Palpations: Abdomen is soft.     Tenderness: There is no abdominal tenderness.  Musculoskeletal:     Cervical back: Neck supple.  Skin:    General: Skin is warm and dry.  Neurological:     Mental Status: She is alert.     ED Results / Procedures / Treatments   Labs (all labs ordered are listed, but only abnormal results are displayed) Labs Reviewed    CBC WITH DIFFERENTIAL/PLATELET - Abnormal; Notable for the following components:      Result Value   RBC 6.08 (*)    Hemoglobin 16.7 (*)    HCT 50.6 (*)    Platelets 143 (*)    Lymphs Abs 0.4 (*)  All other components within normal limits  COMPREHENSIVE METABOLIC PANEL - Abnormal; Notable for the following components:   Sodium 133 (*)    Chloride 97 (*)    Glucose, Bld 127 (*)    BUN 33 (*)    Creatinine, Ser 1.80 (*)    Calcium 8.0 (*)    Albumin 1.9 (*)    AST 169 (*)    ALT 76 (*)    Total Bilirubin 0.2 (*)    GFR calc non Af Amer 37 (*)    GFR calc Af Amer 42 (*)    All other components within normal limits  D-DIMER, QUANTITATIVE (NOT AT [facility]) - Abnormal; Notable for the following components:   D-Dimer, Quant 3.93 (*)    All other components within normal limits  LACTATE DEHYDROGENASE - Abnormal; Notable for the following components:   LDH 1,192 (*)    All other components within normal limits  TRIGLYCERIDES - Abnormal; Notable for the following components:   Triglycerides 308 (*)    All other components within normal limits  FIBRINOGEN - Abnormal; Notable for the following components:   Fibrinogen 715 (*)    All other components within normal limits  TRIGLYCERIDES - Abnormal; Notable for the following components:   Triglycerides 297 (*)    All other components within normal limits  C-REACTIVE PROTEIN - Abnormal; Notable for the following components:   CRP 2.1 (*)    All other components within normal limits  FERRITIN - Abnormal; Notable for the following components:   Ferritin 1,373 (*)    All other components within normal limits  I-STAT ARTERIAL BLOOD GAS, ED - Abnormal; Notable for the following components:   pO2, Arterial 128 (*)    Acid-Base Excess 3.0 (*)    Sodium 133 (*)    Calcium, Ion 1.07 (*)    HCT 48.0 (*)    Hemoglobin 16.3 (*)    All other components within normal limits  CULTURE, BLOOD (ROUTINE X 2)  CULTURE, BLOOD (ROUTINE X 2)  LACTIC  ACID, PLASMA  PROCALCITONIN  I-STAT BETA HCG BLOOD, ED ([facility], [facility], [facility] ONLY)    EKG EKG Interpretation  Date/Time:  Thursday 11-28-19 15:18:28 EDT Ventricular Rate:  120 PR Interval:    QRS Duration: 82 QT Interval:  321 QTC Calculation: 454 R Axis:   15 Text Interpretation: Sinus tachycardia Probable left atrial enlargement Low voltage, precordial leads No STEMI Confirmed by Alvester Chou [phone]) on 11-28-19 3:37:01 PM   Radiology DG Chest Port 1 View  Result Date: November 28, 2019 CLINICAL DATA:  COVID, shortness of breath EXAM: PORTABLE CHEST 1 VIEW COMPARISON:  10/27/2019 FINDINGS: Persistent bilateral pulmonary opacities. No significant pleural effusion. No pneumothorax. Stable cardiomediastinal contours. IMPRESSION: Persistent bilateral pulmonary opacities consistent with COVID-19 pneumonia. Electronically Signed   By: Guadlupe Spanish M.D.   On: 2019/11/28 15:42    Procedures Procedures (including critical care time)  Medications Ordered in ED Medications  remdesivir 200 mg in sodium chloride 0.9% 250 mL IVPB (has no administration in time range)    Followed by  remdesivir 100 mg in sodium chloride 0.9 % 100 mL IVPB (has no administration in time range)  dexamethasone (DECADRON) injection 6 mg (has no administration in time range)  sodium chloride 0.9 % bolus 1,000 mL (1,000 mLs Intravenous New Bag/Given 11-28-2019 1716)  acetaminophen (TYLENOL) suppository 650 mg (650 mg Rectal Given 11-28-2019 1714)    ED Course  I have reviewed the triage vital signs and the nursing  notes.  Pertinent labs & imaging results that were available during my care of the patient were reviewed by me and considered in my medical decision making (see chart for details).  Clinical Course as of Oct 29 1736  Thu Oct 30, 2019  1533 34 yo female w/ hx of obesity presenting with hypoxic resp failure in setting of recent covid diagnosis.  Approx day 10 of symptoms.  Not vaccinated for covid.  She [facility] to  [facility] and was hypoxic to the 70's on NRB.  Here was started on bipap on arrival 10/5 at 50% FIO2.  On my evaluation 15 minutes later she was still tachypneic with RR 44, Spo2 90%.  She is feeling better and does not appear to be tiring out on bipap. I've asked RT to titrate up her bipap settings and get an ABG.  Nursing getting other labs.  PA provider just spoke with ICU team who will come evaluate patient.  She will need close monitoring for possible intubation.  Awaiting gas and lab results.  Xray pending.   [MT]  1602 pH, Arterial: 7.423 [MT]  1602 pO2, Arterial(!): 128 [MT]  1602 ABG results reassuring, ICU team evaluating patient now.    pCO2 arterial: 41.6 [MT]    Clinical Course User Index [MT] Trifan, Kermit BaloMatthew J, MD   MDM Rules/Calculators/A&P                          34 year old female who is unvaccinated for COVID-19 who presents to the ED today via EMS for shortness of breath and hypoxia. Seen at urgent care earlier today and placed on nonrebreather, sent to the ED. Upon my arrival patient has already been placed on BiPAP. She appears to seem uncomfortable. She is tachypneic with respirations in the 40s. She is also tachycardic. She is able to speak in very short sentences, only satting about 90% on BiPAP. Is also noted to be febrile 103. Patient will need to be admitted at this time. Given the continued respiratory distress on BiPAP respiratory therapy has been reconsulted, would like for patient ventilator settings to be increased. ABG ordered. Will consult critical care at this time. Will obtain order stat for Covid. We do have a positive Covid test in our system and therefore will not retest at this time. Will provide 1 L fluid bolus.   CXR with bilateral opacities consistent with pt's COVID diagnosis ABG with normal pH and pCO2 41.6.  CBC without leukocytosis. Hgb elevated at 16.7 consistent with dehydration.   RT evaluated patient; she did appear more comfortably on the NRB prior to  being placed on bipap. She has been switched to 40 HFNC.   Critical care has evaluated patient; she appears more stable now on the HFNC. They recommend hospitalist admission.   Discussed case with Dr. Robb Matarrtiz who agrees to evaluate patient for admission. Recommends ordering remdesivir and decadron in the meantime; have ordered.   This note was prepared using Dragon voice recognition software and may include unintentional dictation errors due to the inherent limitations of voice recognition software.    Final Clinical Impression(s) / ED Diagnoses Final diagnoses:  COVID-19  Pneumonia due to COVID-19 virus  Hypoxia    Rx / DC Orders ED Discharge Orders    None       Tanda RockersVenter, Lavern Maslow, PA-C 10/29/2019 1738    Terald Sleeperrifan, Matthew J, MD 10/05/2019 [phone]

## 2019-10-30 NOTE — Progress Notes (Signed)
Patient taken off BiPAP and placed on 40L HFNC per MD order. Tolerating well. SAT 94%

## 2019-10-30 NOTE — ED Provider Notes (Signed)
.  Critical Care Performed by: Terald Sleeper, MD Authorized by: Terald Sleeper, MD   Critical care provider statement:    Critical care time (minutes):  35   Critical care was necessary to treat or prevent imminent or life-threatening deterioration of the following conditions:  Respiratory failure   Critical care was time spent personally by me on the following activities:  Discussions with consultants, evaluation of patient's response to treatment, examination of patient, ordering and performing treatments and interventions, ordering and review of laboratory studies, ordering and review of radiographic studies, pulse oximetry, re-evaluation of patient's condition, obtaining history from patient or surrogate and review of old charts Comments:     COVID hypoxic respiratory failure requiring bipap, repeat bedside reassessment      Terald Sleeper, MD 10/11/2019 [phone]

## 2019-10-30 NOTE — ED Notes (Signed)
Pt presents to Urgent Care in parking lot. Called the front desk letting us know she was here for her 2pm appt. This RN went out to assess patient at 1559 and found her to be breathing fast, weak, and labored breathing. This RN obtained pulse ox and brought it to the patients car. Patient's pulse ox is 70% on RA, respirations 65, pulse 130.  This RN asked the provider to call 911. Patient placed on NRB at 25L/min at 1405. This RN stayed at the patients car to monitor her while waiting for EMS arrival.  Pulse ox 90% at 1410.  Bedside report given to EMS at 1425 and they took over care at that time.

## 2019-10-30 NOTE — ED Notes (Signed)
Pryor Ochoa mother [phone] would like an update on the pt

## 2019-10-30 NOTE — H&P (Signed)
History and Physical  Bianca Cantrell UXL:[id number] DOB: May 10, 1985 DOA: 11/22/2019  PCP: Cain Saupe, MD Patient coming from: Home  I have personally briefly reviewed patient's old medical records in [facility]   Chief Complaint: SOB  HPI: Bianca Cantrell is a 34 y.o. female past medical history of morbid obesity with a BMI greater than 50, recently seen in urgent care on 10/23/2019 for sore throat she tested positive for SARS-CoV-2 at that time she attempted to go to the [facility] emergency room a couple of days due to worsening symptoms left prior to being seen. Comes into the ED with shortness of breath she relates she started feeling bad about 10 days prior to admission started becoming short of breath over the weekend she check her saturations and she was 70% EMS was called she was placed on a nonrebreather and given Solu-Medrol, Zofran and normal saline in route.  In the ED: To be febrile tachycardic with a blood pressure 138/28 EMS had on 15 L nonrebreather satting 80% was placed around BiPAP then transition to high flow nasal cannula with an FiO2 of 100% and a flow rate of 40, With new acute kidney injury and elevated LFTs normalizing alkaline phosphatase and bilirubin. Chest x-ray showed bilateral infiltrates. Procalcitonin is pending  Review of Systems: All systems reviewed and apart from history of presenting illness, are negative.  Past Medical History:  Diagnosis Date  . Abnormal Pap smear    HPV  . Anemia   . Chlamydia   . Eczema   . Gallstones   . Infection    urinary tract infection  . No pertinent past medical history   . Obesity   . Sickle cell trait [facility])    Past Surgical History:  Procedure Laterality Date  . APPENDECTOMY  2010  . BIOPSY  11/13/2017   Procedure: BIOPSY;  Surgeon: Benancio Deeds, MD;  Location: [facility];  Service: Gastroenterology;;  . [name]  . ESOPHAGOGASTRODUODENOSCOPY (EGD) WITH PROPOFOL N/A 11/13/2017    Procedure: ESOPHAGOGASTRODUODENOSCOPY (EGD) WITH PROPOFOL;  Surgeon: Benancio Deeds, MD;  Location: [facility];  Service: Gastroenterology;  Laterality: N/A;   Social History:  reports that she quit smoking about 13 months ago. Her smoking use included cigarettes. She has a 0.25 pack-year smoking history. She has never used smokeless tobacco. She reports current alcohol use. She reports that she does not use drugs.   Allergies  Allergen Reactions  . Sulfa Antibiotics Anaphylaxis and Hives  . Sulfur Anaphylaxis  . Other Hives    Mayonnaise     Family History  Problem Relation Age of Onset  . Hypertension Mother   . Heart disease Mother   . Hypertension Maternal Grandmother   . Heart disease Maternal Grandmother   . Cancer Maternal Aunt        leukemia  . Cancer Maternal Uncle        pancreatic  . Other Neg Hx   . Colon cancer Neg Hx   . Esophageal cancer Neg Hx     Prior to Admission medications   Medication Sig Start Date End Date Taking? Authorizing Provider  acetaminophen (TYLENOL) 500 MG tablet Take 500 mg by mouth every 6 (six) hours as needed for mild pain.     [provider]  albuterol (VENTOLIN HFA) 108 (90 Base) MCG/ACT inhaler Inhale 2 puffs into the lungs every 4 (four) hours as needed for wheezing or shortness of breath. 10/23/19   Hall-Potvin,  [place], PA-C  benzonatate (TESSALON) 100 MG capsule Take 1 capsule (100 mg total) by mouth every 8 (eight) hours. 10/23/19   Hall-Potvin, [place], PA-C  cetirizine (ZYRTEC ALLERGY) 10 MG tablet Take 1 tablet (10 mg total) by mouth daily. 10/23/19   Hall-Potvin, [place], PA-C  fluticasone (FLONASE) 50 MCG/ACT nasal spray Place 1 spray into both nostrils daily. 10/23/19   Hall-Potvin, [place], PA-C  omeprazole (PRILOSEC) 20 MG capsule Take 1 capsule (20 mg total) by mouth daily. 09/12/19   Cathie Hoops, Amy V, PA-C  polyethylene glycol (MIRALAX) 17 g packet Take 17 g by mouth daily. 09/12/19   Yu, Amy V, PA-C   Pseudoeph-Doxylamine-DM-APAP (NYQUIL PO) Take by mouth.    [provider]   Physical Exam: Vitals:   11/17/19 1515 11-17-19 1517 Nov 17, 2019 1522  BP:   (!) 135/28  Pulse:   (!) 120  Temp:   (!) 103 F (39.4 C)  TempSrc:   Oral  SpO2: 94%  90%  Weight:  129 kg   Height:  4\' 10"  (1.473 m)      General exam: In no acute distress morbidly obese  Head, eyes and ENT: Miotic head, mucosas are significantly dry.  Neck: Supple. No JVD, carotid bruit or thyromegaly.  Lymphatics: No lymphadenopathy.  Respiratory system: Good air movement with diffuse crackles bilaterally.  Cardiovascular system: S1 and S2 heard, RRR. No JVD, murmurs, gallops, clicks or pedal edema.  Gastrointestinal system: Abdomen is nondistended, soft and nontender. Normal bowel sounds heard. No organomegaly or masses appreciated.  Central nervous system: Alert and oriented. No focal neurological deficits.  Extremities: Symmetric 5 x 5 power. Peripheral pulses symmetrically felt.   Skin: No rashes or acute findings.  Musculoskeletal system: Negative exam.  Psychiatry: Pleasant and cooperative.   Labs on Admission:  Basic Metabolic Panel: Recent Labs  Lab 10/27/19 2306 11/17/2019 1553 11/17/19 1555  NA 137 133* 133*  K 3.8 4.0 4.6  CL 100  --  97*  CO2 24  --  24  GLUCOSE 121*  --  127*  BUN 11  --  33*  CREATININE 1.01*  --  1.80*  CALCIUM 8.6*  --  8.0*   Liver Function Tests: Recent Labs  Lab 10/27/19 2306 11/17/2019 1555  AST 89* 169*  ALT 95* 76*  ALKPHOS 51 48  BILITOT 0.5 0.2*  PROT 8.3* 6.7  ALBUMIN 3.6 1.9*   No results for input(s): LIPASE, AMYLASE in the last 168 hours. No results for input(s): AMMONIA in the last 168 hours. CBC: Recent Labs  Lab 10/27/19 2306 11-17-2019 1553 11-17-19 1555  WBC 5.9  --  6.6  NEUTROABS 4.4  --  6.1  HGB 16.2* 16.3* 16.7*  HCT 48.5* 48.0* 50.6*  MCV 84.6  --  83.2  PLT 143*  --  143*   Cardiac Enzymes: No results for input(s):  CKTOTAL, CKMB, CKMBINDEX, TROPONINI in the last 168 hours.  BNP (last 3 results) No results for input(s): PROBNP in the last 8760 hours. CBG: No results for input(s): GLUCAP in the last 168 hours.  Radiological Exams on Admission: DG Chest Port 1 View  Result Date: 17-Nov-2019 CLINICAL DATA:  COVID, shortness of breath EXAM: PORTABLE CHEST 1 VIEW COMPARISON:  10/27/2019 FINDINGS: Persistent bilateral pulmonary opacities. No significant pleural effusion. No pneumothorax. Stable cardiomediastinal contours. IMPRESSION: Persistent bilateral pulmonary opacities consistent with COVID-19 pneumonia. Electronically Signed   By: 10/29/2019 M.D.   On: 2019/11/17 15:42    EKG: Independently reviewed. Sinus rhythm  normal axis preserve renal loss T wave abnormalities.  Assessment/Plan Acute respiratory failure with hypoxia secondarily to pneumonia due to COVID-19: Keep her on a high flow nasal cannula, try to keep saturations greater than 88%. Focus more on her respiratory status than her saturations. Started on IV remdesivir and steroids. She agreed to [facility] The treatment plan and use of medications and known side effects were discussed with patient/family, they were clearly explained that there is no proven definitive treatment for COVID-19 infection, any medications used here are based on published clinical articles/anecdotal data which are not peer-reviewed or randomized control trials.  Complete risks and long-term side effects are unknown, however in the best clinical judgment they seem to be of some clinical benefit rather than medical risks.  Patient/family agree with the treatment plan and want to receive the given medications. To to monitor inflammatory markers closely. She has no leukocytosis and procalcitonin is pending I will not start empiric antibiotics at this time.  Obesity, morbid, BMI 50 or higher (HCC) Counseling.  AKI (acute kidney injury) (HCC) Unknown baseline creatinine  suspect less than 1 due to her age and no comorbidities. Likely prerenal azotemia in the setting of decreased oral intake, she relates some diarrhea for the last 24 hours but not significant, will start on IV fluids and will be careful as this can worsen her respiratory status, as her chest x-ray appears to show significant bilateral pulmonary infiltrates.  Hypovolemic hyponatremia Started on IV fluids recheck a basic metabolic panel in the morning.  Elevated LFTs Likely due to COVID-19, will continue to follow closely her alkaline phosphatase and bilirubin are unremarkable. She denies any abdominal pain nausea or vomiting.  DVT Prophylaxis: lovenox Code Status: full  Family Communication: none  Disposition Plan: inpatient       It is my clinical opinion that admission to INPATIENT is reasonable and necessary in this 34 y.o. female morbid obese who comes in in acute hypoxic respiratory failure secondary to pneumonia due to COVID-19 requiring an FiO2 of 100% and a flow rate of 40, started on IV remdesivir and steroids and baricitanib, will admitted to stepdown.  Given the aforementioned, the predictability of an adverse outcome is felt to be significant. I expect that the patient will require at least 2 midnights in the hospital to treat this condition.  Marinda Elk MD [facility]   10/22/2019, 5:45 PM

## 2019-10-30 NOTE — ED Notes (Signed)
Pillows placed under pt lower back to increase comfort.

## 2019-10-31 ENCOUNTER — Other Ambulatory Visit: Payer: Self-pay

## 2019-10-31 DIAGNOSIS — U071 COVID-19: Secondary | ICD-10-CM

## 2019-10-31 LAB — CBC WITH DIFFERENTIAL/PLATELET
Abs Immature Granulocytes: 0.04 10*3/uL (ref 0.00–0.07)
Basophils Absolute: 0 10*3/uL (ref 0.0–0.1)
Basophils Relative: 0 %
Eosinophils Absolute: 0 10*3/uL (ref 0.0–0.5)
Eosinophils Relative: 0 %
HCT: 47 % — ABNORMAL HIGH (ref 36.0–46.0)
Hemoglobin: 15.4 g/dL — ABNORMAL HIGH (ref 12.0–15.0)
Immature Granulocytes: 1 %
Lymphocytes Relative: 23 %
Lymphs Abs: 1 10*3/uL (ref 0.7–4.0)
MCH: 27.5 pg (ref 26.0–34.0)
MCHC: 32.8 g/dL (ref 30.0–36.0)
MCV: 83.9 fL (ref 80.0–100.0)
Monocytes Absolute: 0.1 10*3/uL (ref 0.1–1.0)
Monocytes Relative: 3 %
Neutro Abs: 3.3 10*3/uL (ref 1.7–7.7)
Neutrophils Relative %: 73 %
Platelets: 167 10*3/uL (ref 150–400)
RBC: 5.6 MIL/uL — ABNORMAL HIGH (ref 3.87–5.11)
RDW: 14.5 % (ref 11.5–15.5)
WBC: 4.4 10*3/uL (ref 4.0–10.5)
nRBC: 0 % (ref 0.0–0.2)

## 2019-10-31 LAB — I-STAT ARTERIAL BLOOD GAS, ED
Acid-Base Excess: 1 mmol/L (ref 0.0–2.0)
Bicarbonate: 26.1 mmol/L (ref 20.0–28.0)
Calcium, Ion: 1.05 mmol/L — ABNORMAL LOW (ref 1.15–1.40)
HCT: 42 % (ref 36.0–46.0)
Hemoglobin: 14.3 g/dL (ref 12.0–15.0)
O2 Saturation: 87 %
Patient temperature: 98.3
Potassium: 3.8 mmol/L (ref 3.5–5.1)
Sodium: 138 mmol/L (ref 135–145)
TCO2: 27 mmol/L (ref 22–32)
pCO2 arterial: 42 mmHg (ref 32.0–48.0)
pH, Arterial: 7.401 (ref 7.350–7.450)
pO2, Arterial: 53 mmHg — ABNORMAL LOW (ref 83.0–108.0)

## 2019-10-31 LAB — COMPREHENSIVE METABOLIC PANEL
ALT: 68 U/L — ABNORMAL HIGH (ref 0–44)
AST: 151 U/L — ABNORMAL HIGH (ref 15–41)
Albumin: 1.7 g/dL — ABNORMAL LOW (ref 3.5–5.0)
Alkaline Phosphatase: 46 U/L (ref 38–126)
Anion gap: 15 (ref 5–15)
BUN: 44 mg/dL — ABNORMAL HIGH (ref 6–20)
CO2: 21 mmol/L — ABNORMAL LOW (ref 22–32)
Calcium: 7.8 mg/dL — ABNORMAL LOW (ref 8.9–10.3)
Chloride: 101 mmol/L (ref 98–111)
Creatinine, Ser: 2.09 mg/dL — ABNORMAL HIGH (ref 0.44–1.00)
GFR calc Af Amer: 35 mL/min — ABNORMAL LOW (ref >60)
GFR calc non Af Amer: 31 mL/min — ABNORMAL LOW (ref >60)
Glucose, Bld: 157 mg/dL — ABNORMAL HIGH (ref 70–99)
Potassium: 4.1 mmol/L (ref 3.5–5.1)
Sodium: 137 mmol/L (ref 135–145)
Total Bilirubin: 0.7 mg/dL (ref 0.3–1.2)
Total Protein: 6.3 g/dL — ABNORMAL LOW (ref 6.5–8.1)

## 2019-10-31 LAB — CBC
HCT: 47.5 % — ABNORMAL HIGH (ref 36.0–46.0)
Hemoglobin: 15.6 g/dL — ABNORMAL HIGH (ref 12.0–15.0)
MCH: 27.4 pg (ref 26.0–34.0)
MCHC: 32.8 g/dL (ref 30.0–36.0)
MCV: 83.5 fL (ref 80.0–100.0)
Platelets: 151 10*3/uL (ref 150–400)
RBC: 5.69 MIL/uL — ABNORMAL HIGH (ref 3.87–5.11)
RDW: 14.4 % (ref 11.5–15.5)
WBC: 5 10*3/uL (ref 4.0–10.5)
nRBC: 0.4 % — ABNORMAL HIGH (ref 0.0–0.2)

## 2019-10-31 LAB — BRAIN NATRIURETIC PEPTIDE: B Natriuretic Peptide: 25.8 pg/mL (ref 0.0–100.0)

## 2019-10-31 LAB — HIV ANTIBODY (ROUTINE TESTING W REFLEX): HIV Screen 4th Generation wRfx: NONREACTIVE

## 2019-10-31 LAB — CREATININE, SERUM
Creatinine, Ser: 2.16 mg/dL — ABNORMAL HIGH (ref 0.44–1.00)
GFR calc Af Amer: 34 mL/min — ABNORMAL LOW (ref >60)
GFR calc non Af Amer: 29 mL/min — ABNORMAL LOW (ref >60)

## 2019-10-31 LAB — D-DIMER, QUANTITATIVE: D-Dimer, Quant: 2.56 ug/mL-FEU — ABNORMAL HIGH (ref 0.00–0.50)

## 2019-10-31 LAB — ABO/RH: ABO/RH(D): O POS

## 2019-10-31 LAB — C-REACTIVE PROTEIN: CRP: 1.8 mg/dL — ABNORMAL HIGH (ref <1.0)

## 2019-10-31 MED ORDER — SODIUM CHLORIDE 0.9 % IV SOLN: 100.0000 mg | Freq: Every day | INTRAVENOUS | Status: DC

## 2019-10-31 MED ORDER — SODIUM CHLORIDE 0.9 % IV SOLN
500.0000 mg | INTRAVENOUS | Status: AC
  Administered 2019-10-31 – 2019-11-04 (×5): 500 mg via INTRAVENOUS
  Filled 2019-10-31 (×5): qty 500

## 2019-10-31 MED ORDER — SODIUM CHLORIDE 0.9 % IV SOLN
1.0000 g | INTRAVENOUS | Status: AC
  Administered 2019-10-31 – 2019-11-04 (×5): 1 g via INTRAVENOUS
  Filled 2019-10-31: qty 1
  Filled 2019-10-31 (×4): qty 10

## 2019-10-31 MED ORDER — PANTOPRAZOLE SODIUM 40 MG PO TBEC
40.0000 mg | DELAYED_RELEASE_TABLET | Freq: Every day | ORAL | Status: DC
  Administered 2019-10-31 – 2019-11-06 (×6): 40 mg via ORAL
  Filled 2019-10-31 (×6): qty 1

## 2019-10-31 MED ORDER — SODIUM CHLORIDE 0.9 % IV SOLN
100.0000 mg | Freq: Every day | INTRAVENOUS | Status: AC
  Administered 2019-10-31 – 2019-11-03 (×4): 100 mg via INTRAVENOUS
  Filled 2019-10-31 (×5): qty 20

## 2019-10-31 MED ORDER — METHYLPREDNISOLONE SODIUM SUCC 125 MG IJ SOLR
80.0000 mg | Freq: Three times a day (TID) | INTRAMUSCULAR | Status: DC
  Administered 2019-10-31 – 2019-11-03 (×9): 80 mg via INTRAVENOUS
  Filled 2019-10-31 (×9): qty 2

## 2019-10-31 NOTE — ED Notes (Signed)
Pt began c/o her nose burning and was pulling the oxygen away from her face, she began de-sating, RT was contacted, pt was spoken to about the importance of her O2 staying close to her mouth and nose to remain effective. Pt stated that the burning isn't bad now and is cooperating with wearing the supplemental oxygen equipment.

## 2019-10-31 NOTE — ED Notes (Signed)
Placed on R side. Pt O2 sat up to to 93-95% on 50% non rebreather and and nasal cannula

## 2019-10-31 NOTE — ED Notes (Signed)
Breakfast Ordered 

## 2019-10-31 NOTE — Progress Notes (Signed)
NAME:  Bianca Cantrell, MRN:  [id number], DOB:  Jun 11, 1985, LOS: 1 ADMISSION DATE:  11/01/2019, CONSULTATION DATE:  10/28/2019 REFERRING MD:  Hyman Hopes [facility], CHIEF COMPLAINT:  Dyspnea.    HPI/course in hospital  34 year old woman unvaccinated for COVID. 9 day history of fever, myalgias and increasing dyspnea.   Presented today to urgent care but found to be hypoxic with saturations into the 70's with severe distress.  Brought to ED and started on BIPAP. Once in the ED she was de-escalated to HFNC and tolerated well. She was admitted to the hospitalists for tx of COVID-19 related ARDS. On 8/27 she became more fatigued with worsening hypoxia and PCCM was asked to see.   Past Medical History   Past Medical History:  Diagnosis Date  . Abnormal Pap smear    HPV  . Anemia   . Chlamydia   . Eczema   . Gallstones   . Infection    urinary tract infection  . No pertinent past medical history   . Obesity   . Sickle cell trait [facility])      Past Surgical History:  Procedure Laterality Date  . APPENDECTOMY  2010  . BIOPSY  11/13/2017   Procedure: BIOPSY;  Surgeon: Benancio Deeds, MD;  Location: [facility];  Service: Gastroenterology;;  . [name]  . ESOPHAGOGASTRODUODENOSCOPY (EGD) WITH PROPOFOL N/A 11/13/2017   Procedure: ESOPHAGOGASTRODUODENOSCOPY (EGD) WITH PROPOFOL;  Surgeon: Benancio Deeds, MD;  Location: [facility];  Service: Gastroenterology;  Laterality: N/A;     Interim history/subjective:  Somewhat more dyspnea today. Now on 50L HFNC and NRB at 15LPM.   Objective   Blood pressure 117/84, pulse 100, temperature 98.1 F (36.7 C), temperature source Oral, resp. rate 13, height 4\' 10"  (1.473 m), weight 129 kg, SpO2 (!) 87 %.    Vent Mode: BIPAP;PCV FiO2 (%):  [60 %-100 %] 100 % Set Rate:  [12 bmp] 12 bmp PEEP:  [8 cmH20-10 cmH20] 10 cmH20   Intake/Output Summary (Last 24 hours) at 10/31/2019 1431 Last data filed at 10/31/2019 1343 Gross per 24 hour   Intake 1250 ml  Output --  Net 1250 ml   Filed Weights   10/05/2019 1517  Weight: 129 kg    Examination: General:  Obese young adult female in minimal distress Neuro:  Alert, oriented, non-focal HEENT:  [place]/AT, No JVD noted, PERRL Cardiovascular:  RRR, no MRG Lungs:  Diminished throughout  Abdomen:  Soft, non-distended Musculoskeletal:  No acute deformity Skin:  Intact, MMM   Ancillary tests (personally reviewed)  CBC: Recent Labs  Lab 10/27/19 2306 10/09/2019 1553 10/12/2019 1555 10/31/19 0040 10/31/19 0511  WBC 5.9  --  6.6 5.0 4.4  NEUTROABS 4.4  --  6.1  --  3.3  HGB 16.2* 16.3* 16.7* 15.6* 15.4*  HCT 48.5* 48.0* 50.6* 47.5* 47.0*  MCV 84.6  --  83.2 83.5 83.9  PLT 143*  --  143* 151 167    Basic Metabolic Panel: Recent Labs  Lab 10/27/19 2306 10/10/2019 1553 10/29/2019 1555 10/31/19 0040 10/31/19 0511  NA 137 133* 133*  --  137  K 3.8 4.0 4.6  --  4.1  CL 100  --  97*  --  101  CO2 24  --  24  --  21*  GLUCOSE 121*  --  127*  --  157*  BUN 11  --  33*  --  44*  CREATININE 1.01*  --  1.80* 2.16*  2.09*  CALCIUM 8.6*  --  8.0*  --  7.8*   GFR: Estimated Creatinine Clearance: 46.4 mL/min (A) (by C-G formula based on SCr of 2.09 mg/dL (H)). Recent Labs  Lab 10/27/19 2306 07-Nov-2019 1555 November 07, 2019 1606 10/31/19 0040 10/31/19 0511  PROCALCITON  --  1.02  --   --   --   WBC 5.9 6.6  --  5.0 4.4  LATICACIDVEN  --   --  1.8  --   --     Liver Function Tests: Recent Labs  Lab 10/27/19 2306 11/07/2019 1555 10/31/19 0511  AST 89* 169* 151*  ALT 95* 76* 68*  ALKPHOS 51 48 46  BILITOT 0.5 0.2* 0.7  PROT 8.3* 6.7 6.3*  ALBUMIN 3.6 1.9* 1.7*   No results for input(s): LIPASE, AMYLASE in the last 168 hours. No results for input(s): AMMONIA in the last 168 hours.  ABG    Component Value Date/Time   PHART 7.423 11-07-2019 1553   PCO2ART 41.6 2019-11-07 1553   PO2ART 128 (H) 2019-11-07 1553   HCO3 26.6 11/07/2019 1553   TCO2 28 Nov 07, 2019 1553   O2SAT  99.0 2019/11/07 1553     Coagulation Profile: No results for input(s): INR, PROTIME in the last 168 hours.  Cardiac Enzymes: No results for input(s): CKTOTAL, CKMB, CKMBINDEX, TROPONINI in the last 168 hours.  HbA1C: No results found for: HGBA1C  CBG: No results for input(s): GLUCAP in the last 168 hours.   Assessment & Plan:   ARDS due to COVID 19 pneumonia Morbid obesity  Plan: - Continue HFNC and NRB for now. ABG shows hypoxia, but ventilatory markers re-assuring.  - Conitnue tx with remdesivir, baricitinib, and steroids.  - Low threshold for intubation - I have instructed her RN to assess frequently for signs of respiratory distress including nasal flaring, accessory muscle use, and paradoxical respirations. RN has been instructed to call my cell directly if any of these are to develop. - Empiric CAP coverage - Change admission status to ICU - Incentive spirometry - Trend inflammatory markers.   AKI - Follow BMP - NS maintenance  Transaminitis - Trend    Joneen Roach, AGACNP-BC [facility]  See Amion for personal pager PCCM on call pager [phone]  10/31/2019 3:17 PM

## 2019-10-31 NOTE — Progress Notes (Signed)
eLink Physician-Brief Progress Note Patient Name: Bianca Cantrell DOB: 1985/06/22 MRN: [id number]   Date of Service  10/31/2019  HPI/Events of Note  RT called from ED regarding concern that patient was breathing in the 40's and may need intubation.  eICU Interventions  I have asked the ground team to see patient stat.        Thomasene Lot Jewel Mcafee 10/31/2019, 10:26 PM

## 2019-10-31 NOTE — ED Notes (Signed)
CCM called by RT- at bedside. Discussed with this RN the need for BIPAP, to educate pt & place order for same.

## 2019-10-31 NOTE — Progress Notes (Signed)
PROGRESS NOTE                                                                                                                                                                                                             Patient Demographics:    Bianca Cantrell, is a 34 y.o. female, DOB - October 12, 1985, TZG:[id number]  Admit date - 2019-11-28   Admitting Physician Lynnell Catalan, MD  Outpatient Primary MD for the patient is Cain Saupe, MD  LOS - 1   Chief Complaint  Patient presents with  . Shortness of Breath       Brief Narrative    Bianca Cantrell is a 34 y.o. female past medical history of morbid obesity with a BMI greater than 50, recently seen in urgent care on 10/23/2019 for sore throat she tested positive for SARS-CoV-2 at that time she attempted to go to the [facility] emergency room a couple of days due to worsening symptoms left prior to being seen. Comes into the ED with shortness of breath she relates she started feeling bad about 10 days prior to admission started becoming short of breath over the weekend she check her saturations and she was 70% EMS was called she was placed on a nonrebreather and given Solu-Medrol, Zofran and normal saline in route. She was  febrile tachycardic with a blood pressure 138/28 EMS had on 15 L nonrebreather satting 80% was placed around BiPAP then transition to high flow nasal cannula with an FiO2 of 100% and a flow rate of 40, With new acute kidney injury and elevated LFTs normalizing alkaline phosphatase and bilirubin. Chest x-ray showed bilateral infiltrates. Procalcitonin is pending   Subjective:    Bianca Cantrell today reports dyspnea, cough, generalized weakness , denies any nausea or vomiting .   Assessment  & Plan :    Active Problems:   Obesity, morbid, BMI 50 or higher (HCC)   Gastroesophageal reflux disease with esophagitis   Acute respiratory failure with hypoxia (HCC)   Pneumonia due to COVID-19  virus   AKI (acute kidney injury) (HCC)   Hypokalemia   Hyponatremia   Elevated LFTs   Acute respiratory distress syndrome (ARDS) due to COVID-19 virus (HCC)  Hypoxic respiratory failure secondary to COVID-19 pneumonia -Patient with severe COVID-19 for pneumonia, with diffuse bilateral multifocal opacities. -She is with severe  hypoxia, she is requiring 50 L heated high flow nasal cannula and 15 L NRB together, he is with some increased work of breathing, but overall appears comfortable, [facility] input greatly appreciated, still we upgraded to ICU level, she is high risk for intubation. -Continue with IV steroids, and so IV Solu-Medrol 80 mg every 8 hours -Continue with baricitinib -Continue with IV remdesivir -Procalcitonin mildly elevated at 1.02, will start on Rocephin and azithromycin -She was encouraged to use incentive spirometry, flutter valve, -Continue to trend inflammatory markers closely.   COVID-19 Labs  Recent Labs    11-10-19 1555 11/10/19 1616 10/31/19 0511  DDIMER 3.93*  --  2.56*  FERRITIN  --  1,373*  --   LDH 1,192*  --   --   CRP  --  2.1* 1.8*    No results found for: SARSCOV2NAA  Obesity, morbid, BMI 50 or higher (HCC)  AKI (acute kidney injury) (HCC) - Unknown baseline creatinine suspect less than 1 due to her age and no comorbidities. -Continue with IV fluids, avoid nephrotoxic medications.  Hypovolemic hyponatremia Proving with IV fluids  Elevated LFTs Likely due to COVID-19, continue to trend  Code Status : Full  Family Communication  : Cussed with husband by phone, updated him, informed him she is a high risk for intubation and she is critical. Disposition Plan  :  Status is: Inpatient  Remains inpatient appropriate because:Hemodynamically unstable   Dispo: The patient is from: Home              Anticipated d/c is to: Home              Anticipated d/c date is: > 3 days              Patient currently is not medically stable to  d/c.     Consults  : PCCM  Procedures  : None  DVT Prophylaxis  : [place] lovenox  Lab Results  Component Value Date   PLT 167 10/31/2019    Antibiotics  :    Anti-infectives (From admission, onward)   Start     Dose/Rate Route Frequency Ordered Stop   10/31/19 1245  remdesivir 100 mg in sodium chloride 0.9 % 100 mL IVPB        100 mg 200 mL/hr over 30 Minutes Intravenous Daily 10/31/19 1233 11/04/19 0959   10/31/19 1230  remdesivir 100 mg in sodium chloride 0.9 % 100 mL IVPB  Status:  Discontinued        100 mg 200 mL/hr over 30 Minutes Intravenous Daily 10/31/19 1227 10/31/19 1229   10/31/19 1000  remdesivir 100 mg in sodium chloride 0.9 % 100 mL IVPB  Status:  Discontinued       "Followed by" Linked Group Details   100 mg 200 mL/hr over 30 Minutes Intravenous Daily 2019-11-10 1736 10/31/19 1227   10/31/19 1000  remdesivir 100 mg in sodium chloride 0.9 % 100 mL IVPB  Status:  Discontinued       "Followed by" Linked Group Details   100 mg 200 mL/hr over 30 Minutes Intravenous Daily 11-10-19 2248 10-Nov-2019 2249   10/31/19 0830  azithromycin (ZITHROMAX) 500 mg in sodium chloride 0.9 % 250 mL IVPB        500 mg 250 mL/hr over 60 Minutes Intravenous Every 24 hours 10/31/19 0829     10/31/19 0830  cefTRIAXone (ROCEPHIN) 1 g in sodium chloride 0.9 % 100 mL IVPB        1  g 200 mL/hr over 30 Minutes Intravenous Every 24 hours 10/31/19 0829     09-Nov-2019 2248  remdesivir 200 mg in sodium chloride 0.9% 250 mL IVPB  Status:  Discontinued       "Followed by" Linked Group Details   200 mg 580 mL/hr over 30 Minutes Intravenous Once Nov 09, 2019 2248 09-Nov-2019 2249   November 09, 2019 1745  remdesivir 200 mg in sodium chloride 0.9% 250 mL IVPB       "Followed by" Linked Group Details   200 mg 580 mL/hr over 30 Minutes Intravenous Once November 09, 2019 1736 11/09/2019 2223        Objective:   Vitals:   10/31/19 1300 10/31/19 1400 10/31/19 1450 10/31/19 1500  BP: 119/89 126/85    Pulse:    98  Resp:   (!) 28    Temp:      TempSrc:      SpO2:    (!) 88%  Weight:      Height:        Wt Readings from Last 3 Encounters:  11/09/19 129 kg  04/17/19 131.4 kg  03/04/19 129.3 kg     Intake/Output Summary (Last 24 hours) at 10/31/2019 1527 Last data filed at 10/31/2019 1343 Gross per 24 hour  Intake 1250 ml  Output --  Net 1250 ml     Physical Exam  Awake Alert, Oriented X 3, No new F.N deficits, obese, in minimal distress Symmetrical Chest wall movement, good air entry bilaterally, no wheezing RRR,No Gallops,Rubs or new Murmurs, No Parasternal Heave +ve B.Sounds, Abd Soft, No tenderness, No rebound - guarding or rigidity. No Cyanosis, Clubbing or edema, No new Rash or bruise     Data Review:    CBC Recent Labs  Lab 10/27/19 2306 10/27/19 2306 2019-11-09 1553 11-09-2019 1555 10/31/19 0040 10/31/19 0511 10/31/19 1456  WBC 5.9  --   --  6.6 5.0 4.4  --   HGB 16.2*   < > 16.3* 16.7* 15.6* 15.4* 14.3  HCT 48.5*   < > 48.0* 50.6* 47.5* 47.0* 42.0  PLT 143*  --   --  143* 151 167  --   MCV 84.6  --   --  83.2 83.5 83.9  --   MCH 28.3  --   --  27.5 27.4 27.5  --   MCHC 33.4  --   --  33.0 32.8 32.8  --   RDW 14.6  --   --  14.6 14.4 14.5  --   LYMPHSABS 1.2  --   --  0.4*  --  1.0  --   MONOABS 0.2  --   --  0.1  --  0.1  --   EOSABS 0.0  --   --  0.0  --  0.0  --   BASOSABS 0.0  --   --  0.0  --  0.0  --    < > = values in this interval not displayed.    Chemistries  Recent Labs  Lab 10/27/19 2306 Nov 09, 2019 1553 2019-11-09 1555 10/31/19 0040 10/31/19 0511 10/31/19 1456  NA 137 133* 133*  --  137 138  K 3.8 4.0 4.6  --  4.1 3.8  CL 100  --  97*  --  101  --   CO2 24  --  24  --  21*  --   GLUCOSE 121*  --  127*  --  157*  --   BUN 11  --  33*  --  44*  --   CREATININE 1.01*  --  1.80* 2.16* 2.09*  --   CALCIUM 8.6*  --  8.0*  --  7.8*  --   AST 89*  --  169*  --  151*  --   ALT 95*  --  76*  --  68*  --   ALKPHOS 51  --  48  --  46  --   BILITOT 0.5  --  0.2*  --   0.7  --    ------------------------------------------------------------------------------------------------------------------ Recent Labs    10/15/2019 1555 11/01/2019 1616  TRIG 297* 308*    No results found for: HGBA1C ------------------------------------------------------------------------------------------------------------------ No results for input(s): TSH, T4TOTAL, T3FREE, THYROIDAB in the last 72 hours.  Invalid input(s): FREET3 ------------------------------------------------------------------------------------------------------------------ Recent Labs    10/26/2019 1616  FERRITIN 1,373*    Coagulation profile No results for input(s): INR, PROTIME in the last 168 hours.  Recent Labs    10/17/2019 1555 10/31/19 0511  DDIMER 3.93* 2.56*    Cardiac Enzymes No results for input(s): CKMB, TROPONINI, MYOGLOBIN in the last 168 hours.  Invalid input(s): CK ------------------------------------------------------------------------------------------------------------------    Component Value Date/Time   BNP 25.8 10/31/2019 0511    Inpatient Medications  Scheduled Meds: . baricitinib  2 mg Oral Daily  . benzonatate  100 mg Oral Q8H  . enoxaparin (LOVENOX) injection  65 mg Subcutaneous Q24H  . methylPREDNISolone (SOLU-MEDROL) injection  80 mg Intravenous Q8H  . pantoprazole  40 mg Oral Daily   Continuous Infusions: . sodium chloride 100 mL/hr at 10/31/19 0728  . azithromycin Stopped (10/31/19 1501)  . cefTRIAXone (ROCEPHIN)  IV Stopped (10/31/19 1343)  . remdesivir 100 mg in NS 100 mL Stopped (10/31/19 1501)   PRN Meds:.acetaminophen, acetaminophen, albuterol, ondansetron **OR** ondansetron (ZOFRAN) IV, polyethylene glycol  Micro Results Recent Results (from the past 240 hour(s))  Culture, group A strep     Status: None   Collection Time: 10/23/19  6:30 PM   Specimen: Throat  Result Value Ref Range Status   Specimen Description THROAT  Final   Special Requests  NONE  Final   Culture   Final    NO GROUP A STREP (S.PYOGENES) ISOLATED Performed at [facility], [address] [place], [place] [address]    Report Status 10/26/2019 FINAL  Final  Blood Culture (routine x 2)     Status: None (Preliminary result)   Collection Time: 10/15/2019  3:17 PM   Specimen: BLOOD  Result Value Ref Range Status   Specimen Description BLOOD LEFT ANTECUBITAL  Final   Special Requests   Final    BOTTLES DRAWN AEROBIC AND ANAEROBIC Blood Culture adequate volume   Culture   Final    NO GROWTH < 24 HOURS Performed at [facility], [address] [place], [place] [address]    Report Status PENDING  Incomplete  Blood Culture (routine x 2)     Status: None (Preliminary result)   Collection Time: 10/18/2019  4:16 PM   Specimen: BLOOD  Result Value Ref Range Status   Specimen Description BLOOD RIGHT ANTECUBITAL  Final   Special Requests   Final    BOTTLES DRAWN AEROBIC AND ANAEROBIC Blood Culture results may not be optimal due to an inadequate volume of blood received in culture bottles   Culture   Final    NO GROWTH < 24 HOURS Performed at [facility], [address] [place], [place] [address]    Report Status PENDING  Incomplete    Radiology Reports DG Chest Port 1 View  Result Date: 10/14/2019 CLINICAL DATA:  COVID, shortness of breath EXAM: PORTABLE CHEST 1 VIEW COMPARISON:  10/27/2019 FINDINGS: Persistent bilateral pulmonary opacities. No significant pleural effusion. No pneumothorax. Stable cardiomediastinal contours. IMPRESSION: Persistent bilateral pulmonary opacities consistent with COVID-19 pneumonia. Electronically Signed   By: Guadlupe Spanish M.D.   On: 10/12/2019 15:42   DG Chest Port 1 View  Result Date: 10/27/2019 CLINICAL DATA:  COVID-19 positivity with increasing shortness of breath EXAM: PORTABLE CHEST 1 VIEW COMPARISON:  07/10/2018 FINDINGS: Cardiac shadow is within normal limits. Patchy airspace opacities are noted bilaterally  consistent with the given clinical history. No sizable effusion is noted. No bony abnormality is noted. IMPRESSION: Bilateral airspace opacities consistent with the given clinical history. Electronically Signed   By: Alcide Clever M.D.   On: 10/27/2019 23:12     Huey Bienenstock M.D on 10/31/2019 at 3:27 PM    [facility] -  Office  [phone]

## 2019-11-01 DIAGNOSIS — J1282 Pneumonia due to coronavirus disease 2019: Secondary | ICD-10-CM

## 2019-11-01 DIAGNOSIS — J9601 Acute respiratory failure with hypoxia: Secondary | ICD-10-CM

## 2019-11-01 DIAGNOSIS — N179 Acute kidney failure, unspecified: Secondary | ICD-10-CM

## 2019-11-01 DIAGNOSIS — J80 Acute respiratory distress syndrome: Secondary | ICD-10-CM

## 2019-11-01 DIAGNOSIS — R7989 Other specified abnormal findings of blood chemistry: Secondary | ICD-10-CM

## 2019-11-01 LAB — COMPREHENSIVE METABOLIC PANEL
ALT: 69 U/L — ABNORMAL HIGH (ref 0–44)
AST: 144 U/L — ABNORMAL HIGH (ref 15–41)
Albumin: 1.7 g/dL — ABNORMAL LOW (ref 3.5–5.0)
Alkaline Phosphatase: 49 U/L (ref 38–126)
Anion gap: 13 (ref 5–15)
BUN: 59 mg/dL — ABNORMAL HIGH (ref 6–20)
CO2: 22 mmol/L (ref 22–32)
Calcium: 7.9 mg/dL — ABNORMAL LOW (ref 8.9–10.3)
Chloride: 103 mmol/L (ref 98–111)
Creatinine, Ser: 1.82 mg/dL — ABNORMAL HIGH (ref 0.44–1.00)
GFR calc Af Amer: 42 mL/min — ABNORMAL LOW (ref >60)
GFR calc non Af Amer: 36 mL/min — ABNORMAL LOW (ref >60)
Glucose, Bld: 183 mg/dL — ABNORMAL HIGH (ref 70–99)
Potassium: 4.2 mmol/L (ref 3.5–5.1)
Sodium: 138 mmol/L (ref 135–145)
Total Bilirubin: 0.6 mg/dL (ref 0.3–1.2)
Total Protein: 6.1 g/dL — ABNORMAL LOW (ref 6.5–8.1)

## 2019-11-01 LAB — CBC WITH DIFFERENTIAL/PLATELET
Abs Immature Granulocytes: 0 10*3/uL (ref 0.00–0.07)
Band Neutrophils: 1 %
Basophils Absolute: 0 10*3/uL (ref 0.0–0.1)
Basophils Relative: 0 %
Eosinophils Absolute: 0 10*3/uL (ref 0.0–0.5)
Eosinophils Relative: 0 %
HCT: 46.2 % — ABNORMAL HIGH (ref 36.0–46.0)
Hemoglobin: 15.1 g/dL — ABNORMAL HIGH (ref 12.0–15.0)
Lymphocytes Relative: 14 %
Lymphs Abs: 1 10*3/uL (ref 0.7–4.0)
MCH: 28.2 pg (ref 26.0–34.0)
MCHC: 32.7 g/dL (ref 30.0–36.0)
MCV: 86.2 fL (ref 80.0–100.0)
Monocytes Absolute: 0.2 10*3/uL (ref 0.1–1.0)
Monocytes Relative: 3 %
Neutro Abs: 6.1 10*3/uL (ref 1.7–7.7)
Neutrophils Relative %: 82 %
Platelets: 206 10*3/uL (ref 150–400)
RBC: 5.36 MIL/uL — ABNORMAL HIGH (ref 3.87–5.11)
RDW: 14.7 % (ref 11.5–15.5)
WBC: 7.3 10*3/uL (ref 4.0–10.5)
nRBC: 0 % (ref 0.0–0.2)

## 2019-11-01 LAB — GLUCOSE, CAPILLARY
Glucose-Capillary: 182 mg/dL — ABNORMAL HIGH (ref 70–99)
Glucose-Capillary: 203 mg/dL — ABNORMAL HIGH (ref 70–99)
Glucose-Capillary: 177 mg/dL — ABNORMAL HIGH (ref 70–99)

## 2019-11-01 LAB — D-DIMER, QUANTITATIVE: D-Dimer, Quant: 1.73 ug/mL-FEU — ABNORMAL HIGH (ref 0.00–0.50)

## 2019-11-01 LAB — C-REACTIVE PROTEIN: CRP: 0.5 mg/dL (ref <1.0)

## 2019-11-01 MED ORDER — ALBUMIN HUMAN 25 % IV SOLN
50.0000 g | Freq: Once | INTRAVENOUS | Status: AC
  Administered 2019-11-01: 50 g via INTRAVENOUS
  Filled 2019-11-01: qty 200

## 2019-11-01 MED ORDER — CHLORHEXIDINE GLUCONATE CLOTH 2 % EX PADS
6.0000 | MEDICATED_PAD | Freq: Every day | CUTANEOUS | Status: DC
  Administered 2019-11-01 – 2019-11-07 (×6): 6 via TOPICAL

## 2019-11-01 NOTE — Progress Notes (Signed)
Assisted family with tele-visit via elink 

## 2019-11-01 NOTE — Progress Notes (Signed)
Patient under PCCM care, [facility] will sign off, [facility] can be consulted to resume care once patient is stable for transfer outside the unit. Huey Bienenstock MD

## 2019-11-01 NOTE — Progress Notes (Signed)
Pt taken off Bipap to do mouth care and give pt a break.  Pt placed on HHFNC 35L 100% and NRB.  Pt's Sp02 is 92% RR in the mid 20's.  Pt looks comfortable. Will resume Bipap if needed.

## 2019-11-01 NOTE — Progress Notes (Signed)
NAME:  Bianca Cantrell, MRN:  [id number], DOB:  [birth year], LOS: 2 ADMISSION DATE:  November 26, 2019, CONSULTATION DATE:  Nov 26, 2019 REFERRING MD:  Hyman Hopes [facility] - ED [facility], CHIEF COMPLAINT:  Dyspnea.    HPI/course in hospital  34 year old woman unvaccinated for COVID. 9 day history of fever, myalgias and increasing dyspnea.   Presented today to urgent care but found to be hypoxic with saturations into the 70's with severe distress.   Brought to ED and started on BIPAP. Once in the ED she was de-escalated to HFNC and tolerated well. She was admitted to the hospitalists for tx of COVID-19 related ARDS. On 8/27 she became more fatigued with worsening hypoxia and PCCM was asked to see.   Past Medical History   Past Medical History:  Diagnosis Date  . Abnormal Pap smear    HPV  . Anemia   . Chlamydia   . Eczema   . Gallstones   . Infection    urinary tract infection  . No pertinent past medical history   . Obesity   . Sickle cell trait [facility])      Past Surgical History:  Procedure Laterality Date  . APPENDECTOMY  2010  . BIOPSY  11/13/2017   Procedure: BIOPSY;  Surgeon: Benancio Deeds, MD;  Location: [facility];  Service: Gastroenterology;;  . [name]  . ESOPHAGOGASTRODUODENOSCOPY (EGD) WITH PROPOFOL N/A 11/13/2017   Procedure: ESOPHAGOGASTRODUODENOSCOPY (EGD) WITH PROPOFOL;  Surgeon: Benancio Deeds, MD;  Location: [facility];  Service: Gastroenterology;  Laterality: N/A;     Interim history/subjective:  Reports increased dyspnea with reported associated dry mouth Bedside RT reports increased tachypnea and mildly worsening lethargy compared to day prior Has remained consistently on BiPAP since early morning of 8/28  Objective   Blood pressure (!) 106/92, pulse 92, temperature 98.8 F (37.1 C), temperature source Axillary, resp. rate (!) 39, height 4\' 10"  (1.473 m), weight 129 kg, SpO2 (!) 88 %.    Vent Mode: BIPAP;PCV FiO2 (%):  [100 %] 100 % Set Rate:  [14  bmp] 14 bmp PEEP:  [10 cmH20] 10 cmH20   Intake/Output Summary (Last 24 hours) at 11/01/2019 1121 Last data filed at 10/31/2019 1343 Gross per 24 hour  Intake 0 ml  Output --  Net 0 ml   Filed Weights   11-26-2019 1517  Weight: 129 kg    Examination: General: Acute ill-appearing obese adult female lying on ED stretcher in mild discomfort from BiPAP and dyspnea  HEENT: BiPAP, MM pink/moist, PERRL, sclera nonicteric Neuro: Alert and oriented but lethargic but easy to arouse able to follow all commands CV: s1s2 regular rate and rhythm, no murmur, rubs, or gallops,  PULM: Tachypnea with respiratory rate mid to upper 30s and low 40s oxygen saturations fluctuating between low 80s to low 90s on 80% FiO2 through BiPAP slight increased work of breathing.  Decreased breath sounds bilaterally, GI: soft, bowel sounds active in all 4 quadrants, non-tender, non-distended,  Extremities: warm/dry, no edema  Skin: no rashes or lesions  Ancillary tests (personally reviewed)  CBC: Recent Labs  Lab 10/27/19 2306 26-Nov-2019 1553 11/26/2019 1555 10/31/19 0040 10/31/19 0511 10/31/19 1456 11/01/19 0840  WBC 5.9  --  6.6 5.0 4.4  --  7.3  NEUTROABS 4.4  --  6.1  --  3.3  --  6.1  HGB 16.2*   < > 16.7* 15.6* 15.4* 14.3 15.1*  HCT 48.5*   < > 50.6* 47.5* 47.0* 42.0 46.2*  MCV  84.6  --  83.2 83.5 83.9  --  86.2  PLT 143*  --  143* 151 167  --  206   < > = values in this interval not displayed.    Basic Metabolic Panel: Recent Labs  Lab 10/27/19 2306 10/27/19 2306 10/16/2019 1553 10/13/2019 1555 10/31/19 0040 10/31/19 0511 10/31/19 1456 11/01/19 0840  NA 137   < > 133* 133*  --  137 138 138  K 3.8   < > 4.0 4.6  --  4.1 3.8 4.2  CL 100  --   --  97*  --  101  --  103  CO2 24  --   --  24  --  21*  --  22  GLUCOSE 121*  --   --  127*  --  157*  --  183*  BUN 11  --   --  33*  --  44*  --  59*  CREATININE 1.01*  --   --  1.80* 2.16* 2.09*  --  1.82*  CALCIUM 8.6*  --   --  8.0*  --  7.8*  --   7.9*   < > = values in this interval not displayed.   GFR: Estimated Creatinine Clearance: 53.3 mL/min (A) (by C-G formula based on SCr of 1.82 mg/dL (H)). Recent Labs  Lab 10/19/2019 1555 11/04/2019 1606 10/31/19 0040 10/31/19 0511 11/01/19 0840  PROCALCITON 1.02  --   --   --   --   WBC 6.6  --  5.0 4.4 7.3  LATICACIDVEN  --  1.8  --   --   --     Liver Function Tests: Recent Labs  Lab 10/27/19 2306 10/21/2019 1555 10/31/19 0511 11/01/19 0840  AST 89* 169* 151* 144*  ALT 95* 76* 68* 69*  ALKPHOS 51 48 46 49  BILITOT 0.5 0.2* 0.7 0.6  PROT 8.3* 6.7 6.3* 6.1*  ALBUMIN 3.6 1.9* 1.7* 1.7*   No results for input(s): LIPASE, AMYLASE in the last 168 hours. No results for input(s): AMMONIA in the last 168 hours.  ABG    Component Value Date/Time   PHART 7.401 10/31/2019 1456   PCO2ART 42.0 10/31/2019 1456   PO2ART 53 (L) 10/31/2019 1456   HCO3 26.1 10/31/2019 1456   TCO2 27 10/31/2019 1456   O2SAT 87.0 10/31/2019 1456     Coagulation Profile: No results for input(s): INR, PROTIME in the last 168 hours.  Cardiac Enzymes: No results for input(s): CKTOTAL, CKMB, CKMBINDEX, TROPONINI in the last 168 hours.  HbA1C: No results found for: HGBA1C  CBG: No results for input(s): GLUCAP in the last 168 hours.   Assessment & Plan:   ARDS due to COVID 19 pneumonia Morbid obesity Acute Hypoxic / Hypercapnic Respiratory Failure  -Per chart review and report from both RT and RN patient appears to be slightly decompensating with worsening tachypnea and now BiPAP dependence P: Continue BiPAP therapy Patient is at very high risk for needing intubation Encourage pulmonary hygiene If able encourage self prone Follow cultures Continue remdesivir, baricitnb, and IV steroids Continue empiric CAP coverage Remains pended for ICU admission Trend inflammatory markers Bedside RN and RT again instructed to monitor for signs of worsening respiratory decompensation  Acute Kidney  Injury, improving -in the setting of Covid pneumonia. creatinine on admission 1.80 P: Follow renal function / urine output Trend Bmet Avoid nephrotoxins, ensure adequate renal perfusion  Strict intake and output  Transaminitis, improving P: Trend LFTs   Best  practice:  Diet: N.p.o. while on BiPAP Pain/Anxiety/Delirium protocol (if indicated): As needed VAP protocol (if indicated): In place DVT prophylaxis: Lovenox GI prophylaxis: PPI Glucose control: SSI Mobility: Bedrest  Code Status: Full Family Communication: Updated by patient  Disposition: ICU  Labs   CBC: Recent Labs  Lab 10/27/19 2306 10/31/2019 1553 10/14/2019 1555 10/31/19 0040 10/31/19 0511 10/31/19 1456 11/01/19 0840  WBC 5.9  --  6.6 5.0 4.4  --  7.3  NEUTROABS 4.4  --  6.1  --  3.3  --  6.1  HGB 16.2*   < > 16.7* 15.6* 15.4* 14.3 15.1*  HCT 48.5*   < > 50.6* 47.5* 47.0* 42.0 46.2*  MCV 84.6  --  83.2 83.5 83.9  --  86.2  PLT 143*  --  143* 151 167  --  206   < > = values in this interval not displayed.    Basic Metabolic Panel: Recent Labs  Lab 10/27/19 2306 10/27/19 2306 10/08/2019 1553 10/25/2019 1555 10/31/19 0040 10/31/19 0511 10/31/19 1456 11/01/19 0840  NA 137   < > 133* 133*  --  137 138 138  K 3.8   < > 4.0 4.6  --  4.1 3.8 4.2  CL 100  --   --  97*  --  101  --  103  CO2 24  --   --  24  --  21*  --  22  GLUCOSE 121*  --   --  127*  --  157*  --  183*  BUN 11  --   --  33*  --  44*  --  59*  CREATININE 1.01*  --   --  1.80* 2.16* 2.09*  --  1.82*  CALCIUM 8.6*  --   --  8.0*  --  7.8*  --  7.9*   < > = values in this interval not displayed.   GFR: Estimated Creatinine Clearance: 53.3 mL/min (A) (by C-G formula based on SCr of 1.82 mg/dL (H)). Recent Labs  Lab 10/08/2019 1555 10/10/2019 1606 10/31/19 0040 10/31/19 0511 11/01/19 0840  PROCALCITON 1.02  --   --   --   --   WBC 6.6  --  5.0 4.4 7.3  LATICACIDVEN  --  1.8  --   --   --     Liver Function Tests: Recent Labs  Lab  10/27/19 2306 11/04/2019 1555 10/31/19 0511 11/01/19 0840  AST 89* 169* 151* 144*  ALT 95* 76* 68* 69*  ALKPHOS 51 48 46 49  BILITOT 0.5 0.2* 0.7 0.6  PROT 8.3* 6.7 6.3* 6.1*  ALBUMIN 3.6 1.9* 1.7* 1.7*   No results for input(s): LIPASE, AMYLASE in the last 168 hours. No results for input(s): AMMONIA in the last 168 hours.  ABG    Component Value Date/Time   PHART 7.401 10/31/2019 1456   PCO2ART 42.0 10/31/2019 1456   PO2ART 53 (L) 10/31/2019 1456   HCO3 26.1 10/31/2019 1456   TCO2 27 10/31/2019 1456   O2SAT 87.0 10/31/2019 1456     Coagulation Profile: No results for input(s): INR, PROTIME in the last 168 hours.  Cardiac Enzymes: No results for input(s): CKTOTAL, CKMB, CKMBINDEX, TROPONINI in the last 168 hours.  HbA1C: No results found for: HGBA1C  CBG: No results for input(s): GLUCAP in the last 168 hours.   Critical care time:    Performed by: Delfin Gant  Total critical care time: 40 minutes  Critical care time was exclusive  of separately billable procedures and treating other patients.  Critical care was necessary to treat or prevent imminent or life-threatening deterioration.  Critical care was time spent personally by me on the following activities: development of treatment plan with patient and/or surrogate as well as nursing, discussions with consultants, evaluation of patient's response to treatment, examination of patient, obtaining history from patient or surrogate, ordering and performing treatments and interventions, ordering and review of laboratory studies, ordering and review of radiographic studies, pulse oximetry and re-evaluation of patient's condition.  Delfin GantWhitney F Jozi Malachi, NP-C [facility] Contact / Pager information can be found on Amion  11/01/2019, 11:36 AM

## 2019-11-02 LAB — CBC WITH DIFFERENTIAL/PLATELET
Abs Immature Granulocytes: 0.08 10*3/uL — ABNORMAL HIGH (ref 0.00–0.07)
Basophils Absolute: 0 10*3/uL (ref 0.0–0.1)
Basophils Relative: 0 %
Eosinophils Absolute: 0 10*3/uL (ref 0.0–0.5)
Eosinophils Relative: 0 %
HCT: 40.1 % (ref 36.0–46.0)
Hemoglobin: 13 g/dL (ref 12.0–15.0)
Immature Granulocytes: 1 %
Lymphocytes Relative: 11 %
Lymphs Abs: 1.1 10*3/uL (ref 0.7–4.0)
MCH: 27.4 pg (ref 26.0–34.0)
MCHC: 32.4 g/dL (ref 30.0–36.0)
MCV: 84.4 fL (ref 80.0–100.0)
Monocytes Absolute: 0.4 10*3/uL (ref 0.1–1.0)
Monocytes Relative: 4 %
Neutro Abs: 7.8 10*3/uL — ABNORMAL HIGH (ref 1.7–7.7)
Neutrophils Relative %: 84 %
Platelets: 199 10*3/uL (ref 150–400)
RBC: 4.75 MIL/uL (ref 3.87–5.11)
RDW: 14.3 % (ref 11.5–15.5)
WBC: 9.4 10*3/uL (ref 4.0–10.5)
nRBC: 0 % (ref 0.0–0.2)

## 2019-11-02 LAB — GLUCOSE, CAPILLARY
Glucose-Capillary: 176 mg/dL — ABNORMAL HIGH (ref 70–99)
Glucose-Capillary: 197 mg/dL — ABNORMAL HIGH (ref 70–99)
Glucose-Capillary: 167 mg/dL — ABNORMAL HIGH (ref 70–99)
Glucose-Capillary: 170 mg/dL — ABNORMAL HIGH (ref 70–99)
Glucose-Capillary: 148 mg/dL — ABNORMAL HIGH (ref 70–99)

## 2019-11-02 LAB — COMPREHENSIVE METABOLIC PANEL
ALT: 83 U/L — ABNORMAL HIGH (ref 0–44)
AST: 172 U/L — ABNORMAL HIGH (ref 15–41)
Albumin: 2.4 g/dL — ABNORMAL LOW (ref 3.5–5.0)
Alkaline Phosphatase: 66 U/L (ref 38–126)
Anion gap: 11 (ref 5–15)
BUN: 54 mg/dL — ABNORMAL HIGH (ref 6–20)
CO2: 25 mmol/L (ref 22–32)
Calcium: 8 mg/dL — ABNORMAL LOW (ref 8.9–10.3)
Chloride: 103 mmol/L (ref 98–111)
Creatinine, Ser: 1.67 mg/dL — ABNORMAL HIGH (ref 0.44–1.00)
GFR calc Af Amer: 46 mL/min — ABNORMAL LOW (ref >60)
GFR calc non Af Amer: 40 mL/min — ABNORMAL LOW (ref >60)
Glucose, Bld: 193 mg/dL — ABNORMAL HIGH (ref 70–99)
Potassium: 3.7 mmol/L (ref 3.5–5.1)
Sodium: 139 mmol/L (ref 135–145)
Total Bilirubin: 0.6 mg/dL (ref 0.3–1.2)
Total Protein: 5.9 g/dL — ABNORMAL LOW (ref 6.5–8.1)

## 2019-11-02 LAB — HEMOGLOBIN A1C
Hgb A1c MFr Bld: 6.9 % — ABNORMAL HIGH (ref 4.8–5.6)
Mean Plasma Glucose: 151.33 mg/dL

## 2019-11-02 LAB — D-DIMER, QUANTITATIVE: D-Dimer, Quant: 2.06 ug/mL-FEU — ABNORMAL HIGH (ref 0.00–0.50)

## 2019-11-02 LAB — MAGNESIUM: Magnesium: 3.6 mg/dL — ABNORMAL HIGH (ref 1.7–2.4)

## 2019-11-02 LAB — C-REACTIVE PROTEIN: CRP: 0.5 mg/dL (ref <1.0)

## 2019-11-02 MED ORDER — INSULIN ASPART 100 UNIT/ML ~~LOC~~ SOLN
0.0000 [IU] | SUBCUTANEOUS | Status: DC
  Administered 2019-11-02: 4 [IU] via SUBCUTANEOUS
  Administered 2019-11-02: 3 [IU] via SUBCUTANEOUS
  Administered 2019-11-02: 4 [IU] via SUBCUTANEOUS
  Administered 2019-11-03: 3 [IU] via SUBCUTANEOUS
  Administered 2019-11-03: 4 [IU] via SUBCUTANEOUS
  Administered 2019-11-03: 3 [IU] via SUBCUTANEOUS
  Administered 2019-11-03 (×2): 4 [IU] via SUBCUTANEOUS
  Administered 2019-11-03: 11 [IU] via SUBCUTANEOUS
  Administered 2019-11-03: 4 [IU] via SUBCUTANEOUS
  Administered 2019-11-04 (×2): 3 [IU] via SUBCUTANEOUS
  Administered 2019-11-04: 4 [IU] via SUBCUTANEOUS
  Administered 2019-11-04: 7 [IU] via SUBCUTANEOUS
  Administered 2019-11-05: 4 [IU] via SUBCUTANEOUS
  Administered 2019-11-05: 3 [IU] via SUBCUTANEOUS
  Administered 2019-11-05: 4 [IU] via SUBCUTANEOUS
  Administered 2019-11-05: 3 [IU] via SUBCUTANEOUS
  Administered 2019-11-05: 4 [IU] via SUBCUTANEOUS
  Administered 2019-11-06 (×3): 3 [IU] via SUBCUTANEOUS
  Administered 2019-11-06: 4 [IU] via SUBCUTANEOUS
  Administered 2019-11-07: 3 [IU] via SUBCUTANEOUS
  Administered 2019-11-07: 7 [IU] via SUBCUTANEOUS
  Administered 2019-11-07: 11 [IU] via SUBCUTANEOUS
  Administered 2019-11-07: 3 [IU] via SUBCUTANEOUS
  Administered 2019-11-07 (×2): 4 [IU] via SUBCUTANEOUS
  Administered 2019-11-08: 7 [IU] via SUBCUTANEOUS

## 2019-11-02 NOTE — Progress Notes (Signed)
Updated pts mother Okey Regal on pts status.  All questions answered.  Erick Blinks, RN

## 2019-11-02 NOTE — Progress Notes (Signed)
Assisted tele visit to patient with family member.  Luwanda Starr M, RN  

## 2019-11-02 NOTE — Progress Notes (Signed)
Assisted tele visit to patient with family member.  Jahaziel Francois M, RN  

## 2019-11-02 NOTE — Progress Notes (Signed)
NAME:  Bianca Cantrell, MRN:  [id number], DOB:  Feb 05, 1986, LOS: 3 ADMISSION DATE:  11/18/2019, CONSULTATION DATE:  2019/11/18 REFERRING MD:  Hyman Hopes [facility] - ED [facility], CHIEF COMPLAINT:  Dyspnea.    HPI/course in hospital  34 year old woman unvaccinated for COVID. 9 day history of fever, myalgias and increasing dyspnea.   Presented today to urgent care but found to be hypoxic with saturations into the 70's with severe distress.   Brought to ED and started on BIPAP. Once in the ED she was de-escalated to HFNC and tolerated well. She was admitted to the hospitalists for tx of COVID-19 related ARDS. On 8/27 she became more fatigued with worsening hypoxia and PCCM was asked to see.   Past Medical History   Past Medical History:  Diagnosis Date  . Abnormal Pap smear    HPV  . Anemia   . Chlamydia   . Eczema   . Gallstones   . Infection    urinary tract infection  . No pertinent past medical history   . Obesity   . Sickle cell trait [facility])      Past Surgical History:  Procedure Laterality Date  . APPENDECTOMY  2010  . BIOPSY  11/13/2017   Procedure: BIOPSY;  Surgeon: Benancio Deeds, MD;  Location: [facility];  Service: Gastroenterology;;  . [name]  . ESOPHAGOGASTRODUODENOSCOPY (EGD) WITH PROPOFOL N/A 11/13/2017   Procedure: ESOPHAGOGASTRODUODENOSCOPY (EGD) WITH PROPOFOL;  Surgeon: Benancio Deeds, MD;  Location: [facility];  Service: Gastroenterology;  Laterality: N/A;     Interim history/subjective:  NAEON. Stable on BiPAP. Tolerating HFNC/NRB during day. Feels breathing is a bit better.  Objective   Blood pressure 118/76, pulse 94, temperature 98.2 F (36.8 C), temperature source Oral, resp. rate (!) 39, height 4\' 10"  (1.473 m), weight 129 kg, SpO2 100 %.    Vent Mode: BIPAP FiO2 (%):  [100 %] 100 % Set Rate:  [12 bmp] 12 bmp PEEP:  [8 cmH20-10 cmH20] 8 cmH20   Intake/Output Summary (Last 24 hours) at 11/02/2019 1804 Last data filed at 11/02/2019  1700 Gross per 24 hour  Intake 1050 ml  Output 1550 ml  Net -500 ml   Filed Weights   November 18, 2019 1517  Weight: 129 kg    Examination: General: Acute ill-appearing obese adult female lying in bed HEENT: HFNC/NRB, MM pink/moist, PERRL, sclera nonicteric Neuro: Alert and oriented but lethargic but easy to arouse able to follow all commands CV: s1s2 regular rate and rhythm, no murmur, rubs, or gallops,  PULM: Tachypnea with respiratory rate high 20s GI: soft, bowel sounds active in all 4 quadrants, non-tender, non-distended,  Extremities: warm/dry, no edema  Skin: no rashes or lesions  Ancillary tests (personally reviewed)  CBC: Recent Labs  Lab 10/27/19 2306 11-18-19 1553 11/18/2019 1555 Nov 18, 2019 1555 10/31/19 0040 10/31/19 0511 10/31/19 1456 11/01/19 0840 11/02/19 0750  WBC 5.9  --  6.6  --  5.0 4.4  --  7.3 9.4  NEUTROABS 4.4  --  6.1  --   --  3.3  --  6.1 7.8*  HGB 16.2*   < > 16.7*   < > 15.6* 15.4* 14.3 15.1* 13.0  HCT 48.5*   < > 50.6*   < > 47.5* 47.0* 42.0 46.2* 40.1  MCV 84.6  --  83.2  --  83.5 83.9  --  86.2 84.4  PLT 143*  --  143*  --  151 167  --  206 199   < > =  values in this interval not displayed.    Basic Metabolic Panel: Recent Labs  Lab 10/27/19 2306 10-31-2019 1553 10/31/2019 1555 10/31/19 0040 10/31/19 0511 10/31/19 1456 11/01/19 0840 11/02/19 0750  NA 137   < > 133*  --  137 138 138 139  K 3.8   < > 4.6  --  4.1 3.8 4.2 3.7  CL 100  --  97*  --  101  --  103 103  CO2 24  --  24  --  21*  --  22 25  GLUCOSE 121*  --  127*  --  157*  --  183* 193*  BUN 11  --  33*  --  44*  --  59* 54*  CREATININE 1.01*  --  1.80* 2.16* 2.09*  --  1.82* 1.67*  CALCIUM 8.6*  --  8.0*  --  7.8*  --  7.9* 8.0*  MG  --   --   --   --   --   --   --  3.6*   < > = values in this interval not displayed.   GFR: Estimated Creatinine Clearance: 58.1 mL/min (A) (by C-G formula based on SCr of 1.67 mg/dL (H)). Recent Labs  Lab 10/31/19 1555 10-31-2019 1555  10/31/19 1606 10/31/19 0040 10/31/19 0511 11/01/19 0840 11/02/19 0750  PROCALCITON 1.02  --   --   --   --   --   --   WBC 6.6   < >  --  5.0 4.4 7.3 9.4  LATICACIDVEN  --   --  1.8  --   --   --   --    < > = values in this interval not displayed.    Liver Function Tests: Recent Labs  Lab 10/27/19 2306 2019-10-31 1555 10/31/19 0511 11/01/19 0840 11/02/19 0750  AST 89* 169* 151* 144* 172*  ALT 95* 76* 68* 69* 83*  ALKPHOS 51 48 46 49 66  BILITOT 0.5 0.2* 0.7 0.6 0.6  PROT 8.3* 6.7 6.3* 6.1* 5.9*  ALBUMIN 3.6 1.9* 1.7* 1.7* 2.4*   No results for input(s): LIPASE, AMYLASE in the last 168 hours. No results for input(s): AMMONIA in the last 168 hours.  ABG    Component Value Date/Time   PHART 7.401 10/31/2019 1456   PCO2ART 42.0 10/31/2019 1456   PO2ART 53 (L) 10/31/2019 1456   HCO3 26.1 10/31/2019 1456   TCO2 27 10/31/2019 1456   O2SAT 87.0 10/31/2019 1456     Coagulation Profile: No results for input(s): INR, PROTIME in the last 168 hours.  Cardiac Enzymes: No results for input(s): CKTOTAL, CKMB, CKMBINDEX, TROPONINI in the last 168 hours.  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 creatinine on admission 1.80, trending down P: Follow renal function / urine output Trend Bmet Avoid nephrotoxins, ensure adequate renal perfusion  Strict intake and output  Transaminitis, improving P: Trend LFTs   Best practice:  Diet: N.p.o. while on BiPAP Pain/Anxiety/Delirium protocol (if indicated): As needed VAP protocol (if indicated): In place DVT prophylaxis: Lovenox GI prophylaxis: PPI Glucose control: SSI Mobility: Bedrest  Code Status: Full Family Communication: Updated by patient  Disposition: ICU  Labs   CBC: Recent Labs  Lab 10/27/19 2306 08-Nov-2019 1553 11/07/2019 1555 Nov 08, 2019 1555 10/31/19 0040 10/31/19 0511 10/31/19 1456 11/01/19 0840 11/02/19 0750  WBC 5.9  --  6.6  --  5.0 4.4  --  7.3 9.4  NEUTROABS 4.4  --  6.1  --   --  3.3  --  6.1 7.8*  HGB 16.2*   < > 16.7*   < > 15.6* 15.4* 14.3 15.1* 13.0  HCT 48.5*   < > 50.6*   < > 47.5* 47.0* 42.0 46.2* 40.1  MCV 84.6  --  83.2  --  83.5 83.9  --  86.2 84.4  PLT 143*  --  143*  --  151 167  --  206 199   < > = values in this interval not displayed.    Basic Metabolic Panel: Recent Labs  Lab 10/27/19 2306 11/06/2019 1553 11/27/2019 1555 10/31/19 0040 10/31/19 0511 10/31/19 1456 11/01/19 0840 11/02/19 0750  NA 137   < > 133*  --  137 138 138 139  K 3.8   < > 4.6  --  4.1 3.8 4.2 3.7  CL 100  --  97*  --  101  --  103 103  CO2 24  --  24  --  21*  --  22 25  GLUCOSE 121*  --  127*  --  157*  --  183* 193*  BUN 11  --  33*  --  44*  --  59* 54*  CREATININE 1.01*  --  1.80* 2.16* 2.09*  --  1.82* 1.67*  CALCIUM 8.6*  --  8.0*  --  7.8*  --  7.9* 8.0*  MG  --   --   --   --   --   --   --  3.6*   < > = values in this interval not displayed.   GFR: Estimated Creatinine Clearance: 58.1 mL/min (A) (by C-G formula based on SCr of 1.67 mg/dL (H)). Recent Labs  Lab 11/19/2019 1555 November 08, 2019 1555 11/11/2019 1606 10/31/19 0040 10/31/19 0511 11/01/19 0840  11/02/19 0750  PROCALCITON 1.02  --   --   --   --   --   --   WBC 6.6   < >  --  5.0 4.4 7.3 9.4  LATICACIDVEN  --   --  1.8  --   --   --   --    < > = values in this interval not displayed.    Liver Function Tests: Recent Labs  Lab 10/27/19 2306 Nov 08, 2019 1555 10/31/19 0511 11/01/19 0840 11/02/19 0750  AST 89* 169* 151* 144* 172*  ALT 95* 76* 68* 69* 83*  ALKPHOS 51 48 46 49 66  BILITOT 0.5 0.2* 0.7 0.6 0.6  PROT 8.3* 6.7 6.3* 6.1* 5.9*  ALBUMIN  3.6 1.9* 1.7* 1.7* 2.4*   No results for input(s): LIPASE, AMYLASE in the last 168 hours. No results for input(s): AMMONIA in the last 168 hours.  ABG    Component Value Date/Time   PHART 7.401 10/31/2019 1456   PCO2ART 42.0 10/31/2019 1456   PO2ART 53 (L) 10/31/2019 1456   HCO3 26.1 10/31/2019 1456   TCO2 27 10/31/2019 1456   O2SAT 87.0 10/31/2019 1456     Coagulation Profile: No results for input(s): INR, PROTIME in the last 168 hours.  Cardiac Enzymes: No results for input(s): CKTOTAL, CKMB, CKMBINDEX, TROPONINI in the last 168 hours.  HbA1C: Hgb A1c MFr Bld  Date/Time Value Ref Range Status  11/02/2019 07:50 AM 6.9 (H) 4.8 - 5.6 % Final    Comment:    (NOTE) Pre diabetes:          5.7%-6.4%  Diabetes:              >6.4%  Glycemic control for   <7.0% adults with diabetes     CBG: Recent Labs  Lab 11/01/19 2324 11/02/19 0416 11/02/19 0809 11/02/19 1131 11/02/19 1537  GLUCAP 177* 176* 197* 167* 170*     Critical care time:    Performed by: Karren BurlyMatthew R Sion Reinders  Total critical care time: 34 minutes  Critical care time was exclusive of separately billable procedures and treating other patients.  Critical care was necessary to treat or prevent imminent or life-threatening deterioration.  Critical care was time spent personally by me on the following activities: development of treatment plan with patient and/or surrogate as well as nursing, discussions with consultants, evaluation of patient's  response to treatment, examination of patient, obtaining history from patient or surrogate, ordering and performing treatments and interventions, ordering and review of laboratory studies, ordering and review of radiographic studies, pulse oximetry and re-evaluation of patient's condition.

## 2019-11-03 LAB — CBC WITH DIFFERENTIAL/PLATELET
Abs Immature Granulocytes: 0.12 10*3/uL — ABNORMAL HIGH (ref 0.00–0.07)
Basophils Absolute: 0 10*3/uL (ref 0.0–0.1)
Basophils Relative: 0 %
Eosinophils Absolute: 0 10*3/uL (ref 0.0–0.5)
Eosinophils Relative: 0 %
HCT: 44.5 % (ref 36.0–46.0)
Hemoglobin: 14.7 g/dL (ref 12.0–15.0)
Immature Granulocytes: 1 %
Lymphocytes Relative: 11 %
Lymphs Abs: 1.2 10*3/uL (ref 0.7–4.0)
MCH: 27.6 pg (ref 26.0–34.0)
MCHC: 33 g/dL (ref 30.0–36.0)
MCV: 83.6 fL (ref 80.0–100.0)
Monocytes Absolute: 0.5 10*3/uL (ref 0.1–1.0)
Monocytes Relative: 4 %
Neutro Abs: 9.4 10*3/uL — ABNORMAL HIGH (ref 1.7–7.7)
Neutrophils Relative %: 84 %
Platelets: 214 10*3/uL (ref 150–400)
RBC: 5.32 MIL/uL — ABNORMAL HIGH (ref 3.87–5.11)
RDW: 14.4 % (ref 11.5–15.5)
WBC: 11.2 10*3/uL — ABNORMAL HIGH (ref 4.0–10.5)
nRBC: 0 % (ref 0.0–0.2)

## 2019-11-03 LAB — GLUCOSE, CAPILLARY
Glucose-Capillary: 131 mg/dL — ABNORMAL HIGH (ref 70–99)
Glucose-Capillary: 143 mg/dL — ABNORMAL HIGH (ref 70–99)
Glucose-Capillary: 150 mg/dL — ABNORMAL HIGH (ref 70–99)
Glucose-Capillary: 152 mg/dL — ABNORMAL HIGH (ref 70–99)
Glucose-Capillary: 171 mg/dL — ABNORMAL HIGH (ref 70–99)
Glucose-Capillary: 175 mg/dL — ABNORMAL HIGH (ref 70–99)
Glucose-Capillary: 265 mg/dL — ABNORMAL HIGH (ref 70–99)

## 2019-11-03 LAB — COMPREHENSIVE METABOLIC PANEL
ALT: 90 U/L — ABNORMAL HIGH (ref 0–44)
AST: 150 U/L — ABNORMAL HIGH (ref 15–41)
Albumin: 2.3 g/dL — ABNORMAL LOW (ref 3.5–5.0)
Alkaline Phosphatase: 86 U/L (ref 38–126)
Anion gap: 12 (ref 5–15)
BUN: 50 mg/dL — ABNORMAL HIGH (ref 6–20)
CO2: 26 mmol/L (ref 22–32)
Calcium: 8.4 mg/dL — ABNORMAL LOW (ref 8.9–10.3)
Chloride: 106 mmol/L (ref 98–111)
Creatinine, Ser: 1.57 mg/dL — ABNORMAL HIGH (ref 0.44–1.00)
GFR calc Af Amer: 50 mL/min — ABNORMAL LOW (ref >60)
GFR calc non Af Amer: 43 mL/min — ABNORMAL LOW (ref >60)
Glucose, Bld: 169 mg/dL — ABNORMAL HIGH (ref 70–99)
Potassium: 4.1 mmol/L (ref 3.5–5.1)
Sodium: 144 mmol/L (ref 135–145)
Total Bilirubin: 0.7 mg/dL (ref 0.3–1.2)
Total Protein: 6.3 g/dL — ABNORMAL LOW (ref 6.5–8.1)

## 2019-11-03 LAB — C-REACTIVE PROTEIN: CRP: 0.9 mg/dL (ref <1.0)

## 2019-11-03 LAB — D-DIMER, QUANTITATIVE: D-Dimer, Quant: 7.66 ug/mL-FEU — ABNORMAL HIGH (ref 0.00–0.50)

## 2019-11-03 MED ORDER — WHITE PETROLATUM EX OINT
TOPICAL_OINTMENT | CUTANEOUS | Status: DC | PRN
  Administered 2019-11-03: 0.2 via TOPICAL

## 2019-11-03 MED ORDER — WHITE PETROLATUM EX OINT
TOPICAL_OINTMENT | CUTANEOUS | Status: AC
  Administered 2019-11-03: 0.2
  Filled 2019-11-03: qty 28.35

## 2019-11-03 MED ORDER — METHYLPREDNISOLONE SODIUM SUCC 125 MG IJ SOLR
80.0000 mg | Freq: Two times a day (BID) | INTRAMUSCULAR | Status: DC
  Administered 2019-11-03 – 2019-11-08 (×10): 80 mg via INTRAVENOUS
  Filled 2019-11-03 (×10): qty 2

## 2019-11-03 MED ORDER — ALBUTEROL SULFATE HFA 108 (90 BASE) MCG/ACT IN AERS
2.0000 | INHALATION_SPRAY | Freq: Four times a day (QID) | RESPIRATORY_TRACT | Status: DC
  Administered 2019-11-03 – 2019-11-05 (×6): 2 via RESPIRATORY_TRACT

## 2019-11-03 NOTE — Progress Notes (Signed)
Patient O2 Saturation fluctuating between 82-85. Patient states she is short of breath and would like to be placed back on Bipap. Patient placed on Bipap to allow for rest. O2 Saturation improved.   Noe Gens, RN

## 2019-11-03 NOTE — Progress Notes (Signed)
NAME:  Bianca Cantrell, MRN:  [id number], DOB:  August 22, 1985, LOS: 4 ADMISSION DATE:  10/29/2019, CONSULTATION DATE:  10/22/2019 REFERRING MD:  Hyman Hopes [facility] - ED [facility], CHIEF COMPLAINT:  Dyspnea.    HPI/course in hospital  34 year old woman unvaccinated for COVID. 9 day history of fever, myalgias and increasing dyspnea.   Presented today to urgent care but found to be hypoxic with saturations into the 70's with severe distress.   Brought to ED and started on BIPAP. Once in the ED she was de-escalated to HFNC and tolerated well. She was admitted to the hospitalists for tx of COVID-19 related ARDS. On 8/27 she became more fatigued with worsening hypoxia and PCCM was asked to see.   Past Medical History   Past Medical History:  Diagnosis Date  . Abnormal Pap smear    HPV  . Anemia   . Chlamydia   . Eczema   . Gallstones   . Infection    urinary tract infection  . No pertinent past medical history   . Obesity   . Sickle cell trait [facility])      Past Surgical History:  Procedure Laterality Date  . APPENDECTOMY  2010  . BIOPSY  11/13/2017   Procedure: BIOPSY;  Surgeon: Benancio Deeds, MD;  Location: [facility];  Service: Gastroenterology;;  . [name]  . ESOPHAGOGASTRODUODENOSCOPY (EGD) WITH PROPOFOL N/A 11/13/2017   Procedure: ESOPHAGOGASTRODUODENOSCOPY (EGD) WITH PROPOFOL;  Surgeon: Benancio Deeds, MD;  Location: [facility];  Service: Gastroenterology;  Laterality: N/A;     Interim history/subjective:  NAEON. Stable on BiPAP. Transitioning to [facility] again this AM. Cr continues to trend down.  Objective   Blood pressure (!) 141/93, pulse 89, temperature 98.3 F (36.8 C), temperature source Axillary, resp. rate (!) 33, height 4\' 10"  (1.473 m), weight 129 kg, SpO2 (!) 89 %.    Vent Mode: CPAP FiO2 (%):  [100 %] 100 % Set Rate:  [12 bmp] 12 bmp PEEP:  [8 cmH20-10 cmH20] 10 cmH20   Intake/Output Summary (Last 24 hours) at 11/03/2019 0900 Last data filed at  11/03/2019 0631 Gross per 24 hour  Intake 550 ml  Output 2200 ml  Net -1650 ml   Filed Weights   10/21/2019 1517  Weight: 129 kg    Examination: General: Acute ill-appearing obese adult female lying in bed HEENT: BiPAP, MM pink/moist, PERRL, sclera nonicteric Neuro: Alert and oriented  easy to arouse able to follow all commands CV: s1s2 regular rate and rhythm, no murmur, rubs, or gallops,  PULM: Tachypnea with respiratory rate high 20s GI: soft, bowel sounds active in all 4 quadrants, non-tender, non-distended,  Extremities: warm/dry, no edema  Skin: no rashes or lesions  Ancillary tests (personally reviewed)  CBC: Recent Labs  Lab 10/12/2019 1555 10/14/2019 1555 10/31/19 0040 10/31/19 0040 10/31/19 0511 10/31/19 1456 11/01/19 0840 11/02/19 0750 11/03/19 0233  WBC 6.6   < > 5.0  --  4.4  --  7.3 9.4 11.2*  NEUTROABS 6.1  --   --   --  3.3  --  6.1 7.8* 9.4*  HGB 16.7*   < > 15.6*   < > 15.4* 14.3 15.1* 13.0 14.7  HCT 50.6*   < > 47.5*   < > 47.0* 42.0 46.2* 40.1 44.5  MCV 83.2   < > 83.5  --  83.9  --  86.2 84.4 83.6  PLT 143*   < > 151  --  167  --  206 199 214   < > = values in this interval not displayed.    Basic Metabolic Panel: Recent Labs  Lab 11/01/2019 1555 10/20/2019 1555 10/31/19 0040 10/31/19 0511 10/31/19 1456 11/01/19 0840 11/02/19 0750 11/03/19 0233  NA 133*   < >  --  137 138 138 139 144  K 4.6   < >  --  4.1 3.8 4.2 3.7 4.1  CL 97*  --   --  101  --  103 103 106  CO2 24  --   --  21*  --  22 25 26   GLUCOSE 127*  --   --  157*  --  183* 193* 169*  BUN 33*  --   --  44*  --  59* 54* 50*  CREATININE 1.80*   < > 2.16* 2.09*  --  1.82* 1.67* 1.57*  CALCIUM 8.0*  --   --  7.8*  --  7.9* 8.0* 8.4*  MG  --   --   --   --   --   --  3.6*  --    < > = values in this interval not displayed.   GFR: Estimated Creatinine Clearance: 61.8 mL/min (A) (by C-G formula based on SCr of 1.57 mg/dL (H)). Recent Labs  Lab 10/09/2019 1555 10/07/2019 1606 10/31/19 0040  10/31/19 0511 11/01/19 0840 11/02/19 0750 11/03/19 0233  PROCALCITON 1.02  --   --   --   --   --   --   WBC 6.6  --    < > 4.4 7.3 9.4 11.2*  LATICACIDVEN  --  1.8  --   --   --   --   --    < > = values in this interval not displayed.    Liver Function Tests: Recent Labs  Lab 10/23/2019 1555 10/31/19 0511 11/01/19 0840 11/02/19 0750 11/03/19 0233  AST 169* 151* 144* 172* 150*  ALT 76* 68* 69* 83* 90*  ALKPHOS 48 46 49 66 86  BILITOT 0.2* 0.7 0.6 0.6 0.7  PROT 6.7 6.3* 6.1* 5.9* 6.3*  ALBUMIN 1.9* 1.7* 1.7* 2.4* 2.3*   No results for input(s): LIPASE, AMYLASE in the last 168 hours. No results for input(s): AMMONIA in the last 168 hours.  ABG    Component Value Date/Time   PHART 7.401 10/31/2019 1456   PCO2ART 42.0 10/31/2019 1456   PO2ART 53 (L) 10/31/2019 1456   HCO3 26.1 10/31/2019 1456   TCO2 27 10/31/2019 1456   O2SAT 87.0 10/31/2019 1456     Coagulation Profile: No results for input(s): INR, PROTIME in the last 168 hours.  Cardiac Enzymes: No results for input(s): CKTOTAL, CKMB, CKMBINDEX, TROPONINI in the last 168 hours.  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 creatinine on admission 1.80, trending down P: Follow renal function / urine output Trend Bmet Avoid nephrotoxins, ensure adequate renal perfusion  Strict intake and output  Transaminitis, improving P: Trend LFTs   Best practice:  Diet: N.p.o. while on BiPAP, oral diet during day Pain/Anxiety/Delirium protocol (if indicated): As needed VAP protocol (if indicated): In place DVT prophylaxis: Lovenox GI prophylaxis: PPI Glucose control: SSI Mobility: Bedrest  Code Status: Full Family Communication: Updated by patient  Disposition: ICU  Labs   CBC: Recent Labs  Lab 2020-02-29 1555 2020-02-29 1555 10/31/19 0040 10/31/19 0040 10/31/19 0511 10/31/19 1456 11/01/19 0840 11/02/19 0750 11/03/19 0233  WBC 6.6   < > 5.0  --  4.4  --  7.3 9.4 11.2*  NEUTROABS 6.1  --   --   --  3.3  --  6.1 7.8* 9.4*  HGB 16.7*   < > 15.6*   < > 15.4* 14.3 15.1* 13.0 14.7  HCT 50.6*   < > 47.5*   < > 47.0* 42.0 46.2* 40.1 44.5  MCV 83.2   < > 83.5  --  83.9  --  86.2 84.4 83.6  PLT 143*   < > 151  --  167  --  206 199 214   < > = values in this interval not displayed.    Basic Metabolic Panel: Recent Labs  Lab 2020-02-29 1555 2020-02-29 1555 10/31/19 0040 10/31/19 0511 10/31/19 1456 11/01/19 0840 11/02/19 0750 11/03/19 0233  NA 133*   < >  --  137 138 138 139 144  K 4.6   < >  --  4.1 3.8 4.2 3.7 4.1  CL 97*  --   --  101  --  103 103 106  CO2 24  --   --  21*  --  22 25 26   GLUCOSE 127*  --   --  157*  --  183* 193* 169*  BUN 33*  --   --  44*  --  59* 54* 50*  CREATININE 1.80*   < > 2.16* 2.09*  --  1.82* 1.67* 1.57*  CALCIUM 8.0*  --   --  7.8*  --  7.9* 8.0* 8.4*  MG  --   --   --   --   --   --  3.6*  --    < > = values in this interval not displayed.   GFR: Estimated Creatinine Clearance: 61.8 mL/min (A) (by C-G formula based on SCr of 1.57 mg/dL (H)). Recent Labs  Lab 2020-02-29 1555 2020-02-29 1606 10/31/19 0040  10/31/19 0511 11/01/19 0840 11/02/19 0750 11/03/19 0233  PROCALCITON 1.02  --   --   --   --   --   --   WBC 6.6  --    < > 4.4 7.3 9.4 11.2*  LATICACIDVEN  --  1.8  --   --   --   --   --    < > = values in this interval not displayed.    Liver Function Tests: Recent Labs  Lab 2020-02-29 1555 10/31/19 0511 11/01/19 0840 11/02/19 0750 11/03/19 0233  AST 169* 151* 144* 172* 150*  ALT 76* 68* 69* 83* 90*  ALKPHOS 48  46 49 66 86  BILITOT 0.2* 0.7 0.6 0.6 0.7  PROT 6.7 6.3* 6.1* 5.9* 6.3*  ALBUMIN 1.9* 1.7* 1.7* 2.4* 2.3*   No results for input(s): LIPASE, AMYLASE in the last 168 hours. No results for input(s): AMMONIA in the last 168 hours.  ABG    Component Value Date/Time   PHART 7.401 10/31/2019 1456   PCO2ART 42.0 10/31/2019 1456   PO2ART 53 (L) 10/31/2019 1456   HCO3 26.1 10/31/2019 1456   TCO2 27 10/31/2019 1456   O2SAT 87.0 10/31/2019 1456     Coagulation Profile: No results for input(s): INR, PROTIME in the last 168 hours.  Cardiac Enzymes: No results for input(s): CKTOTAL, CKMB, CKMBINDEX, TROPONINI in the last 168 hours.  HbA1C: Hgb A1c MFr Bld  Date/Time Value Ref Range Status  11/02/2019 07:50 AM 6.9 (H) 4.8 - 5.6 % Final    Comment:    (NOTE) Pre diabetes:          5.7%-6.4%  Diabetes:              >6.4%  Glycemic control for   <7.0% adults with diabetes     CBG: Recent Labs  Lab 11/02/19 1537 11/02/19 2008 11/03/19 0027 11/03/19 0427 11/03/19 0828  GLUCAP 170* 148* 152* 171* 175*     Critical care time:    Performed by: Karren Burly  Total critical care time: 31 minutes  Critical care time was exclusive of separately billable procedures and treating other patients.  Critical care was necessary to treat or prevent imminent or life-threatening deterioration.  Critical care was time spent personally by me on the following activities: development of treatment plan with patient and/or surrogate as well as nursing,  discussions with consultants, evaluation of patient's response to treatment, examination of patient, obtaining history from patient or surrogate, ordering and performing treatments and interventions, ordering and review of laboratory studies, ordering and review of radiographic studies, pulse oximetry and re-evaluation of patient's condition.

## 2019-11-04 ENCOUNTER — Inpatient Hospital Stay (HOSPITAL_COMMUNITY): Payer: BC Managed Care – PPO

## 2019-11-04 LAB — COMPREHENSIVE METABOLIC PANEL
ALT: 76 U/L — ABNORMAL HIGH (ref 0–44)
AST: 111 U/L — ABNORMAL HIGH (ref 15–41)
Albumin: 2.1 g/dL — ABNORMAL LOW (ref 3.5–5.0)
Alkaline Phosphatase: 132 U/L — ABNORMAL HIGH (ref 38–126)
Anion gap: 11 (ref 5–15)
BUN: 38 mg/dL — ABNORMAL HIGH (ref 6–20)
CO2: 26 mmol/L (ref 22–32)
Calcium: 8.2 mg/dL — ABNORMAL LOW (ref 8.9–10.3)
Chloride: 106 mmol/L (ref 98–111)
Creatinine, Ser: 1.28 mg/dL — ABNORMAL HIGH (ref 0.44–1.00)
GFR calc Af Amer: >60 mL/min (ref >60)
GFR calc non Af Amer: 55 mL/min — ABNORMAL LOW (ref >60)
Glucose, Bld: 150 mg/dL — ABNORMAL HIGH (ref 70–99)
Potassium: 4.1 mmol/L (ref 3.5–5.1)
Sodium: 143 mmol/L (ref 135–145)
Total Bilirubin: 0.9 mg/dL (ref 0.3–1.2)
Total Protein: 6.4 g/dL — ABNORMAL LOW (ref 6.5–8.1)

## 2019-11-04 LAB — CBC WITH DIFFERENTIAL/PLATELET
Abs Immature Granulocytes: 0 10*3/uL (ref 0.00–0.07)
Basophils Absolute: 0 10*3/uL (ref 0.0–0.1)
Basophils Relative: 0 %
Eosinophils Absolute: 0 10*3/uL (ref 0.0–0.5)
Eosinophils Relative: 0 %
HCT: 43.5 % (ref 36.0–46.0)
Hemoglobin: 14.4 g/dL (ref 12.0–15.0)
Lymphocytes Relative: 4 %
Lymphs Abs: 0.5 10*3/uL — ABNORMAL LOW (ref 0.7–4.0)
MCH: 28 pg (ref 26.0–34.0)
MCHC: 33.1 g/dL (ref 30.0–36.0)
MCV: 84.5 fL (ref 80.0–100.0)
Monocytes Absolute: 0.3 10*3/uL (ref 0.1–1.0)
Monocytes Relative: 2 %
Neutro Abs: 12.3 10*3/uL — ABNORMAL HIGH (ref 1.7–7.7)
Neutrophils Relative %: 94 %
Platelets: 166 10*3/uL (ref 150–400)
RBC: 5.15 MIL/uL — ABNORMAL HIGH (ref 3.87–5.11)
RDW: 14.3 % (ref 11.5–15.5)
WBC: 13.1 10*3/uL — ABNORMAL HIGH (ref 4.0–10.5)
nRBC: 0 /100 WBC
nRBC: 0.2 % (ref 0.0–0.2)

## 2019-11-04 LAB — CULTURE, BLOOD (ROUTINE X 2)
Culture: NO GROWTH
Culture: NO GROWTH
Special Requests: ADEQUATE

## 2019-11-04 LAB — GLUCOSE, CAPILLARY
Glucose-Capillary: 111 mg/dL — ABNORMAL HIGH (ref 70–99)
Glucose-Capillary: 207 mg/dL — ABNORMAL HIGH (ref 70–99)
Glucose-Capillary: 146 mg/dL — ABNORMAL HIGH (ref 70–99)
Glucose-Capillary: 125 mg/dL — ABNORMAL HIGH (ref 70–99)
Glucose-Capillary: 151 mg/dL — ABNORMAL HIGH (ref 70–99)

## 2019-11-04 LAB — D-DIMER, QUANTITATIVE: D-Dimer, Quant: >20 ug/mL-FEU — ABNORMAL HIGH (ref 0.00–0.50)

## 2019-11-04 LAB — C-REACTIVE PROTEIN: CRP: 1.4 mg/dL — ABNORMAL HIGH (ref <1.0)

## 2019-11-04 MED ORDER — DIPHENOXYLATE-ATROPINE 2.5-0.025 MG PO TABS
2.0000 | ORAL_TABLET | Freq: Four times a day (QID) | ORAL | Status: DC | PRN
  Filled 2019-11-04: qty 2

## 2019-11-04 MED ORDER — DIPHENOXYLATE-ATROPINE 2.5-0.025 MG PO TABS
2.0000 | ORAL_TABLET | Freq: Once | ORAL | Status: AC
  Administered 2019-11-04: 2 via ORAL

## 2019-11-04 NOTE — Progress Notes (Signed)
Assisted tele visit to patient with mother.  Eleftherios Dudenhoeffer McEachran, RN  

## 2019-11-04 NOTE — Progress Notes (Signed)
Patient observed pulling down mask and drinking water while on bipap. Educated at length about why she can't drink while on this mask. Further explained that her oxygenation has worsened since yesterday and that we won't be able to take her off this mask at this time. Patient asked if she can walk around and I explained that her oxygenation would drop with any increase in activity and that her HR is already 118. Also reiterated that bipap is the last mask before intubation. That there is no other type of oxygen that we use before we have to intubate. Patient overwhelmed by information. States she understood and that she didn't have any other questions. Patient proceeded to call husband for support. Also updated mother about current situation. Will continue to update family and patient.   Noe Gens, RN

## 2019-11-04 NOTE — Progress Notes (Signed)
Patient placed on bipap due to sats maintaining in 70s.  Tolerating well at this time.

## 2019-11-04 NOTE — Progress Notes (Signed)
NAME:  Bianca Cantrell, MRN:  [id number], DOB:  25-Dec-1985, LOS: 5 ADMISSION DATE:  Nov 28, 2019, CONSULTATION DATE:  11/28/19 REFERRING MD:  Hyman Hopes [facility] - ED [facility], CHIEF COMPLAINT:  Dyspnea.    HPI/course in hospital  34 year old woman unvaccinated for COVID. 9 day history of fever, myalgias and increasing dyspnea.   Presented today to urgent care but found to be hypoxic with saturations into the 70's with severe distress.   Brought to ED and started on BIPAP. Once in the ED she was de-escalated to HFNC and tolerated well. She was admitted to the hospitalists for tx of COVID-19 related ARDS. On 8/27 she became more fatigued with worsening hypoxia and PCCM was asked to see.   Past Medical History   Past Medical History:  Diagnosis Date  . Abnormal Pap smear    HPV  . Anemia   . Chlamydia   . Eczema   . Gallstones   . Infection    urinary tract infection  . No pertinent past medical history   . Obesity   . Sickle cell trait [facility])      Past Surgical History:  Procedure Laterality Date  . APPENDECTOMY  2010  . BIOPSY  11/13/2017   Procedure: BIOPSY;  Surgeon: Benancio Deeds, MD;  Location: [facility];  Service: Gastroenterology;;  . [name]  . ESOPHAGOGASTRODUODENOSCOPY (EGD) WITH PROPOFOL N/A 11/13/2017   Procedure: ESOPHAGOGASTRODUODENOSCOPY (EGD) WITH PROPOFOL;  Surgeon: Benancio Deeds, MD;  Location: [facility];  Service: Gastroenterology;  Laterality: N/A;     Interim history/subjective:  NAEON. Sats better on BiPAP. Mid to low 80s on HFNC/NRB. WOB seems a little worse off BiPAP today.  Objective   Blood pressure (!) 142/89, pulse (!) 109, temperature 99.3 F (37.4 C), temperature source Axillary, resp. rate (!) 32, height 4\' 10"  (1.473 m), weight 129 kg, SpO2 (!) 86 %.    Vent Mode: BIPAP;PCV FiO2 (%):  [100 %] 100 % Set Rate:  [12 bmp] 12 bmp PEEP:  [8 cmH20] 8 cmH20   Intake/Output Summary (Last 24 hours) at 11/04/2019 0902 Last data  filed at 11/04/2019 0620 Gross per 24 hour  Intake 1690.76 ml  Output 2800 ml  Net -1109.24 ml   Filed Weights   November 28, 2019 1517  Weight: 129 kg    Examination: General: Acute ill-appearing obese adult female lying in bed HEENT: BiPAP, MM pink/moist, PERRL, sclera nonicteric Neuro: Alert and oriented  easy to arouse able to follow all commands CV: s1s2 regular rate and rhythm, no murmur, rubs, or gallops,  PULM: Tachypnea with respiratory rate high 20s GI: soft, bowel sounds active in all 4 quadrants, non-tender, non-distended,  Extremities: warm/dry, no edema  Skin: no rashes or lesions  Ancillary tests (personally reviewed)  CBC: Recent Labs  Lab 10/31/19 0511 10/31/19 0511 10/31/19 1456 11/01/19 0840 11/02/19 0750 11/03/19 0233 11/04/19 0504  WBC 4.4  --   --  7.3 9.4 11.2* 13.1*  NEUTROABS 3.3  --   --  6.1 7.8* 9.4* 12.3*  HGB 15.4*   < > 14.3 15.1* 13.0 14.7 14.4  HCT 47.0*   < > 42.0 46.2* 40.1 44.5 43.5  MCV 83.9  --   --  86.2 84.4 83.6 84.5  PLT 167  --   --  206 199 214 166   < > = values in this interval not displayed.    Basic Metabolic Panel: Recent Labs  Lab 10/31/19 0511 10/31/19 0511 10/31/19 1456 11/01/19  0840 11/02/19 0750 11/03/19 0233 11/04/19 0504  NA 137   < > 138 138 139 144 143  K 4.1   < > 3.8 4.2 3.7 4.1 4.1  CL 101  --   --  103 103 106 106  CO2 21*  --   --  22 25 26 26   GLUCOSE 157*  --   --  183* 193* 169* 150*  BUN 44*  --   --  59* 54* 50* 38*  CREATININE 2.09*  --   --  1.82* 1.67* 1.57* 1.28*  CALCIUM 7.8*  --   --  7.9* 8.0* 8.4* 8.2*  MG  --   --   --   --  3.6*  --   --    < > = values in this interval not displayed.   GFR: Estimated Creatinine Clearance: 75.8 mL/min (A) (by C-G formula based on SCr of 1.28 mg/dL (H)). Recent Labs  Lab 10/13/2019 1555 10/31/2019 1606 10/31/19 0040 11/01/19 0840 11/02/19 0750 11/03/19 0233 11/04/19 0504  PROCALCITON 1.02  --   --   --   --   --   --   WBC 6.6  --    < > 7.3 9.4  11.2* 13.1*  LATICACIDVEN  --  1.8  --   --   --   --   --    < > = values in this interval not displayed.    Liver Function Tests: Recent Labs  Lab 10/31/19 0511 11/01/19 0840 11/02/19 0750 11/03/19 0233 11/04/19 0504  AST 151* 144* 172* 150* 111*  ALT 68* 69* 83* 90* 76*  ALKPHOS 46 49 66 86 132*  BILITOT 0.7 0.6 0.6 0.7 0.9  PROT 6.3* 6.1* 5.9* 6.3* 6.4*  ALBUMIN 1.7* 1.7* 2.4* 2.3* 2.1*   No results for input(s): LIPASE, AMYLASE in the last 168 hours. No results for input(s): AMMONIA in the last 168 hours.  ABG    Component Value Date/Time   PHART 7.401 10/31/2019 1456   PCO2ART 42.0 10/31/2019 1456   PO2ART 53 (L) 10/31/2019 1456   HCO3 26.1 10/31/2019 1456   TCO2 27 10/31/2019 1456   O2SAT 87.0 10/31/2019 1456     Coagulation Profile: No results for input(s): INR, PROTIME in the last 168 hours.  Cardiac Enzymes: No results for input(s): CKTOTAL, CKMB, CKMBINDEX, TROPONINI in the last 168 hours.  HbA1C: Hgb A1c MFr Bld  Date/Time Value Ref Range Status  11/02/2019 07:50 AM 6.9 (H) 4.8 - 5.6 % Final    Comment:    (NOTE) Pre diabetes:          5.7%-6.4%  Diabetes:              >6.4%  Glycemic control for   <7.0% adults with diabetes     CBG: Recent Labs  Lab 11/03/19 1155 11/03/19 1525 11/03/19 2005 11/03/19 2311 11/04/19 0304  GLUCAP 265* 143* 150* 131* 111*     Assessment & Plan:   ARDS due to COVID 19 pneumonia Morbid obesity Acute Hypoxic / Hypercapnic Respiratory Failure  -Stable but on high FiO2 BiPAP/HFNC/NRB P: Continue HFNC as tolerated, BiPAP therapy at night and PRN Patient is at very high risk for needing intubation Encourage pulmonary hygiene If able encourage self prone Follow cultures (NGTD) Continue remdesivir, baricitnb, and IV steroids Continue empiric CAP coverage end 8/31  Acute Kidney Injury, improving -in the setting of Covid pneumonia. creatinine on admission 1.80, trending down P: Follow renal function /  urine output Trend Bmet Avoid nephrotoxins, ensure adequate renal perfusion  Strict intake and output  Transaminitis, improving P: Trend LFTs  Diarrhea: Likely related to COVID. Treat with lomotil for now, no suspicion for Cdiff, will monitor.   Best practice  :  Diet: N.p.o. while on BiPAP, oral diet during day Pain/Anxiety/Delirium protocol (if indicated): As needed VAP protocol (if indicated): In place DVT prophylaxis: Lovenox GI prophylaxis: PPI Glucose control: SSI Mobility: Bedrest  Code Status: Full Family Communication: Updated by patient  Disposition: ICU  Labs   CBC: Recent Labs  Lab 10/31/19 0511 10/31/19 0511 10/31/19 1456 11/01/19 0840 11/02/19 0750 11/03/19 0233 11/04/19 0504  WBC 4.4  --   --  7.3 9.4 11.2* 13.1*  NEUTROABS 3.3  --   --  6.1 7.8* 9.4* 12.3*  HGB 15.4*   < > 14.3 15.1* 13.0 14.7 14.4  HCT 47.0*   < > 42.0 46.2* 40.1 44.5 43.5  MCV 83.9  --   --  86.2 84.4 83.6 84.5  PLT 167  --   --  206 199 214 166   < > = values in this interval not displayed.    Basic Metabolic Panel: Recent Labs  Lab 10/31/19 0511 10/31/19 0511 10/31/19 1456 11/01/19 0840 11/02/19 0750 11/03/19 0233 11/04/19 0504  NA 137   < > 138 138 139 144 143  K 4.1   < > 3.8 4.2 3.7 4.1 4.1  CL 101  --   --  103 103 106 106  CO2 21*  --   --  22 25 26 26   GLUCOSE 157*  --   --  183* 193* 169* 150*  BUN 44*  --   --  59* 54* 50* 38*  CREATININE 2.09*  --   --  1.82* 1.67* 1.57* 1.28*  CALCIUM 7.8*  --   --  7.9* 8.0* 8.4* 8.2*  MG  --   --   --   --  3.6*  --   --    < > = values in this interval not displayed.   GFR: Estimated Creatinine Clearance: 75.8 mL/min (A) (by C-G formula based on SCr of 1.28 mg/dL (H)). Recent Labs  Lab 11/15/2019 1555 2019-11-15 1606 10/31/19 0040 11/01/19 0840 11/02/19 0750 11/03/19 0233 11/04/19 0504  PROCALCITON 1.02  --   --   --   --   --   --   WBC 6.6  --    < > 7.3 9.4 11.2* 13.1*  LATICACIDVEN  --  1.8  --   --   --    --   --    < > = values in this interval not displayed.    Liver Function Tests: Recent Labs  Lab 10/31/19 0511 11/01/19 0840 11/02/19 0750 11/03/19 0233 11/04/19 0504  AST 151* 144* 172* 150* 111*  ALT 68* 69* 83* 90* 76*  ALKPHOS 46 49 66 86 132*  BILITOT 0.7 0.6 0.6 0.7 0.9  PROT 6.3* 6.1* 5.9* 6.3* 6.4*  ALBUMIN 1.7* 1.7* 2.4* 2.3* 2.1*   No results for input(s): LIPASE, AMYLASE in the last 168 hours. No results for input(s): AMMONIA in the last 168 hours.  ABG    Component Value Date/Time   PHART 7.401 10/31/2019 1456   PCO2ART 42.0 10/31/2019 1456   PO2ART 53 (L) 10/31/2019 1456   HCO3 26.1 10/31/2019 1456   TCO2 27 10/31/2019 1456   O2SAT 87.0 10/31/2019 1456     Coagulation Profile: No results  for input(s): INR, PROTIME in the last 168 hours.  Cardiac Enzymes: No results for input(s): CKTOTAL, CKMB, CKMBINDEX, TROPONINI in the last 168 hours.  HbA1C: Hgb A1c MFr Bld  Date/Time Value Ref Range Status  11/02/2019 07:50 AM 6.9 (H) 4.8 - 5.6 % Final    Comment:    (NOTE) Pre diabetes:          5.7%-6.4%  Diabetes:              >6.4%  Glycemic control for   <7.0% adults with diabetes     CBG: Recent Labs  Lab 11/03/19 1155 11/03/19 1525 11/03/19 2005 11/03/19 2311 11/04/19 0304  GLUCAP 265* 143* 150* 131* 111*     Critical care time:    Performed by: Karren Burly  Total critical care time: 34 minutes  Critical care time was exclusive of separately billable procedures and treating other patients.  Critical care was necessary to treat or prevent imminent or life-threatening deterioration.  Critical care was time spent personally by me on the following activities: development of treatment plan with patient and/or surrogate as well as nursing, discussions with consultants, evaluation of patient's response to treatment, examination of patient, obtaining history from patient or surrogate, ordering and performing treatments and  interventions, ordering and review of laboratory studies, ordering and review of radiographic studies, pulse oximetry and re-evaluation of patient's condition.

## 2019-11-05 ENCOUNTER — Inpatient Hospital Stay (HOSPITAL_COMMUNITY): Payer: BC Managed Care – PPO

## 2019-11-05 DIAGNOSIS — U071 COVID-19: Secondary | ICD-10-CM

## 2019-11-05 DIAGNOSIS — M7989 Other specified soft tissue disorders: Secondary | ICD-10-CM

## 2019-11-05 LAB — CBC WITH DIFFERENTIAL/PLATELET
Abs Immature Granulocytes: 0.16 10*3/uL — ABNORMAL HIGH (ref 0.00–0.07)
Basophils Absolute: 0 10*3/uL (ref 0.0–0.1)
Basophils Relative: 0 %
Eosinophils Absolute: 0 10*3/uL (ref 0.0–0.5)
Eosinophils Relative: 0 %
HCT: 46.1 % — ABNORMAL HIGH (ref 36.0–46.0)
Hemoglobin: 15.3 g/dL — ABNORMAL HIGH (ref 12.0–15.0)
Immature Granulocytes: 1 %
Lymphocytes Relative: 4 %
Lymphs Abs: 0.6 10*3/uL — ABNORMAL LOW (ref 0.7–4.0)
MCH: 28.2 pg (ref 26.0–34.0)
MCHC: 33.2 g/dL (ref 30.0–36.0)
MCV: 84.9 fL (ref 80.0–100.0)
Monocytes Absolute: 0.3 10*3/uL (ref 0.1–1.0)
Monocytes Relative: 3 %
Neutro Abs: 12.4 10*3/uL — ABNORMAL HIGH (ref 1.7–7.7)
Neutrophils Relative %: 92 %
Platelets: 132 10*3/uL — ABNORMAL LOW (ref 150–400)
RBC: 5.43 MIL/uL — ABNORMAL HIGH (ref 3.87–5.11)
RDW: 14.6 % (ref 11.5–15.5)
WBC: 13.5 10*3/uL — ABNORMAL HIGH (ref 4.0–10.5)
nRBC: 0 % (ref 0.0–0.2)

## 2019-11-05 LAB — COMPREHENSIVE METABOLIC PANEL
ALT: 71 U/L — ABNORMAL HIGH (ref 0–44)
AST: 93 U/L — ABNORMAL HIGH (ref 15–41)
Albumin: 2.1 g/dL — ABNORMAL LOW (ref 3.5–5.0)
Alkaline Phosphatase: 156 U/L — ABNORMAL HIGH (ref 38–126)
Anion gap: 12 (ref 5–15)
BUN: 32 mg/dL — ABNORMAL HIGH (ref 6–20)
CO2: 28 mmol/L (ref 22–32)
Calcium: 8.7 mg/dL — ABNORMAL LOW (ref 8.9–10.3)
Chloride: 109 mmol/L (ref 98–111)
Creatinine, Ser: 1.33 mg/dL — ABNORMAL HIGH (ref 0.44–1.00)
GFR calc Af Amer: >60 mL/min (ref >60)
GFR calc non Af Amer: 53 mL/min — ABNORMAL LOW (ref >60)
Glucose, Bld: 170 mg/dL — ABNORMAL HIGH (ref 70–99)
Potassium: 4.7 mmol/L (ref 3.5–5.1)
Sodium: 149 mmol/L — ABNORMAL HIGH (ref 135–145)
Total Bilirubin: 0.8 mg/dL (ref 0.3–1.2)
Total Protein: 7.1 g/dL (ref 6.5–8.1)

## 2019-11-05 LAB — GLUCOSE, CAPILLARY
Glucose-Capillary: 120 mg/dL — ABNORMAL HIGH (ref 70–99)
Glucose-Capillary: 141 mg/dL — ABNORMAL HIGH (ref 70–99)
Glucose-Capillary: 144 mg/dL — ABNORMAL HIGH (ref 70–99)
Glucose-Capillary: 155 mg/dL — ABNORMAL HIGH (ref 70–99)
Glucose-Capillary: 166 mg/dL — ABNORMAL HIGH (ref 70–99)
Glucose-Capillary: 167 mg/dL — ABNORMAL HIGH (ref 70–99)

## 2019-11-05 LAB — D-DIMER, QUANTITATIVE: D-Dimer, Quant: >20 ug/mL-FEU — ABNORMAL HIGH (ref 0.00–0.50)

## 2019-11-05 LAB — C-REACTIVE PROTEIN: CRP: 6.3 mg/dL — ABNORMAL HIGH (ref <1.0)

## 2019-11-05 MED ORDER — BARICITINIB 2 MG PO TABS
4.0000 mg | ORAL_TABLET | Freq: Every day | ORAL | Status: DC
  Administered 2019-11-05 – 2019-11-06 (×2): 4 mg via ORAL
  Filled 2019-11-05 (×3): qty 2

## 2019-11-05 NOTE — Progress Notes (Signed)
NAME:  Bianca Cantrell, MRN:  [id number], DOB:  15-Feb-1986, LOS: 6 ADMISSION DATE:  2019-04-04, CONSULTATION DATE:  2019-04-04 REFERRING MD:  [facility], CHIEF COMPLAINT:  Dyspnea.    HPI/course in hospital  34 year old woman unvaccinated for COVID. 9 day history of fever, myalgias and increasing dyspnea.   Presented today to urgent care but found to be hypoxic with saturations into the 70's with severe distress.   Brought to ED and started on BIPAP. Once in the ED she was de-escalated to HFNC and tolerated well. She was admitted to the hospitalists for tx of COVID-19 related ARDS. On 8/27 she became more fatigued with worsening hypoxia and PCCM was asked to see.   Past Medical History   Past Medical History:  Diagnosis Date  . Abnormal Pap smear    HPV  . Anemia   . Chlamydia   . Eczema   . Gallstones   . Infection    urinary tract infection  . No pertinent past medical history   . Obesity   . Sickle cell trait [facility](HCC)      Past Surgical History:  Procedure Laterality Date  . APPENDECTOMY  2010  . BIOPSY  11/13/2017   Procedure: BIOPSY;  Surgeon: Benancio DeedsArmbruster, Steven P, MD;  Location: [facility];  Service: Gastroenterology;;  . Gwenith SpitzHOLECYSTECTOMY  2011  . ESOPHAGOGASTRODUODENOSCOPY (EGD) WITH PROPOFOL N/A 11/13/2017   Procedure: ESOPHAGOGASTRODUODENOSCOPY (EGD) WITH PROPOFOL;  Surgeon: Benancio DeedsArmbruster, Steven P, MD;  Location: [facility];  Service: Gastroenterology;  Laterality: N/A;     Interim history/subjective:  NAEON. Sats better on BiPAP. Difficult to fit mask. Better seal overnight, proned this morning with O2 sat to mid 90s.  Objective   Blood pressure 104/72, pulse (!) 128, temperature (!) 100.5 F (38.1 C), temperature source Axillary, resp. rate (!) 36, height 4\' 10"  (1.473 m), weight 129 kg, SpO2 90 %.    Vent Mode: BIPAP FiO2 (%):  [100 %] 100 % Set Rate:  [10 bmp-12 bmp] 10 bmp PEEP:  [8 cmH20-10 cmH20] 10 cmH20   Intake/Output Summary (Last 24 hours) at  11/05/2019 0909 Last data filed at 11/05/2019 0654 Gross per 24 hour  Intake 550 ml  Output 1475 ml  Net -925 ml   Filed Weights   03-02-20 1517  Weight: 129 kg    Examination: General: Acute ill-appearing obese adult female lying in bed HEENT: BiPAP, MM pink/moist, PERRL, sclera nonicteric Neuro: Alert and oriented  easy to arouse able to follow all commands CV: s1s2 regular rate and rhythm, no murmur, rubs, or gallops,  PULM: Tachypnea with respiratory rate high 30s GI: soft, bowel sounds active in all 4 quadrants, non-tender, non-distended,  Extremities: warm/dry, no edema  Skin: no rashes or lesions  Ancillary tests (personally reviewed)  CBC: Recent Labs  Lab 11/01/19 0840 11/02/19 0750 11/03/19 0233 11/04/19 0504 11/05/19 0555  WBC 7.3 9.4 11.2* 13.1* 13.5*  NEUTROABS 6.1 7.8* 9.4* 12.3* 12.4*  HGB 15.1* 13.0 14.7 14.4 15.3*  HCT 46.2* 40.1 44.5 43.5 46.1*  MCV 86.2 84.4 83.6 84.5 84.9  PLT 206 199 214 166 132*    Basic Metabolic Panel: Recent Labs  Lab 11/01/19 0840 11/02/19 0750 11/03/19 0233 11/04/19 0504 11/05/19 0555  NA 138 139 144 143 149*  K 4.2 3.7 4.1 4.1 4.7  CL 103 103 106 106 109  CO2 22 25 26 26 28   GLUCOSE 183* 193* 169* 150* 170*  BUN 59* 54* 50* 38* 32*  CREATININE 1.82* 1.67* 1.57* 1.28* 1.33*  CALCIUM 7.9* 8.0* 8.4* 8.2* 8.7*  MG  --  3.6*  --   --   --    GFR: Estimated Creatinine Clearance: 73 mL/min (A) (by C-G formula based on SCr of 1.33 mg/dL (H)). Recent Labs  Lab 11/09/2019 1555 12/04/2019 1606 10/31/19 0040 11/02/19 0750 11/03/19 0233 11/04/19 0504 11/05/19 0555  PROCALCITON 1.02  --   --   --   --   --   --   WBC 6.6  --    < > 9.4 11.2* 13.1* 13.5*  LATICACIDVEN  --  1.8  --   --   --   --   --    < > = values in this interval not displayed.    Liver Function Tests: Recent Labs  Lab 11/01/19 0840 11/02/19 0750 11/03/19 0233 11/04/19 0504 11/05/19 0555  AST 144* 172* 150* 111* 93*  ALT 69* 83* 90* 76* 71*    ALKPHOS 49 66 86 132* 156*  BILITOT 0.6 0.6 0.7 0.9 0.8  PROT 6.1* 5.9* 6.3* 6.4* 7.1  ALBUMIN 1.7* 2.4* 2.3* 2.1* 2.1*   No results for input(s): LIPASE, AMYLASE in the last 168 hours. No results for input(s): AMMONIA in the last 168 hours.  ABG    Component Value Date/Time   PHART 7.401 10/31/2019 1456   PCO2ART 42.0 10/31/2019 1456   PO2ART 53 (L) 10/31/2019 1456   HCO3 26.1 10/31/2019 1456   TCO2 27 10/31/2019 1456   O2SAT 87.0 10/31/2019 1456     Coagulation Profile: No results for input(s): INR, PROTIME in the last 168 hours.  Cardiac Enzymes: No results for input(s): CKTOTAL, CKMB, CKMBINDEX, TROPONINI in the last 168 hours.  HbA1C: Hgb A1c MFr Bld  Date/Time Value Ref Range Status  11/02/2019 07:50 AM 6.9 (H) 4.8 - 5.6 % Final    Comment:    (NOTE) Pre diabetes:          5.7%-6.4%  Diabetes:              >6.4%  Glycemic control for   <7.0% adults with diabetes     CBG: Recent Labs  Lab 11/04/19 1510 11/04/19 2007 11/05/19 0013 11/05/19 0407 11/05/19 0810  GLUCAP 125* 151* 144* 141* 167*     Assessment & Plan:   ARDS due to COVID 19 pneumonia Morbid obesity Acute Hypoxic / Hypercapnic Respiratory Failure  -Stable but on high FiO2 BiPAP/HFNC/NRB P: Continue BiPAP therapy, on HFNC sats high 70s low 80s on 8/31 Patient is at very high risk for needing intubation Encourage pulmonary hygiene Encourage self prone, sats improve Follow cultures (NGTD) Continue remdesivir, baricitnb, and IV steroids S/p empiric CAP coverage end 8/31  Acute Kidney Injury, improving -in the setting of Covid pneumonia. creatinine on admission 1.80, trending down, stable at ~1.3 (0.6 in 2019) P: Follow renal function / urine output Trend Bmet Avoid nephrotoxins, ensure adequate renal perfusion  Strict intake and output  Transaminitis, improving P: Trend LFTs  Diarrhea: Likely related to COVID. Treat with lomotil for now, no suspicion for Cdiff, will  monitor.   Best practice  :  Diet: N.p.o. while on BiPAP Pain/Anxiety/Delirium protocol (if indicated): As needed VAP protocol (if indicated): In place DVT prophylaxis: Lovenox GI prophylaxis: PPI Glucose control: SSI Mobility: Bedrest  Code Status: Full Family Communication: Updated by patient  Disposition: ICU  Labs   CBC: Recent Labs  Lab 11/01/19 0840 11/02/19 0750 11/03/19 5427 11/04/19 0504 11/05/19 0623  WBC 7.3 9.4 11.2* 13.1* 13.5*  NEUTROABS 6.1 7.8* 9.4* 12.3* 12.4*  HGB 15.1* 13.0 14.7 14.4 15.3*  HCT 46.2* 40.1 44.5 43.5 46.1*  MCV 86.2 84.4 83.6 84.5 84.9  PLT 206 199 214 166 132*    Basic Metabolic Panel: Recent Labs  Lab 11/01/19 0840 11/02/19 0750 11/03/19 0233 11/04/19 0504 11/05/19 0555  NA 138 139 144 143 149*  K 4.2 3.7 4.1 4.1 4.7  CL 103 103 106 106 109  CO2 22 25 26 26 28   GLUCOSE 183* 193* 169* 150* 170*  BUN 59* 54* 50* 38* 32*  CREATININE 1.82* 1.67* 1.57* 1.28* 1.33*  CALCIUM 7.9* 8.0* 8.4* 8.2* 8.7*  MG  --  3.6*  --   --   --    GFR: Estimated Creatinine Clearance: 73 mL/min (A) (by C-G formula based on SCr of 1.33 mg/dL (H)). Recent Labs  Lab 10/14/2019 1555 10/31/2019 1606 10/31/19 0040 11/02/19 0750 11/03/19 0233 11/04/19 0504 11/05/19 0555  PROCALCITON 1.02  --   --   --   --   --   --   WBC 6.6  --    < > 9.4 11.2* 13.1* 13.5*  LATICACIDVEN  --  1.8  --   --   --   --   --    < > = values in this interval not displayed.    Liver Function Tests: Recent Labs  Lab 11/01/19 0840 11/02/19 0750 11/03/19 0233 11/04/19 0504 11/05/19 0555  AST 144* 172* 150* 111* 93*  ALT 69* 83* 90* 76* 71*  ALKPHOS 49 66 86 132* 156*  BILITOT 0.6 0.6 0.7 0.9 0.8  PROT 6.1* 5.9* 6.3* 6.4* 7.1  ALBUMIN 1.7* 2.4* 2.3* 2.1* 2.1*   No results for input(s): LIPASE, AMYLASE in the last 168 hours. No results for input(s): AMMONIA in the last 168 hours.  ABG    Component Value Date/Time   PHART 7.401 10/31/2019 1456   PCO2ART 42.0  10/31/2019 1456   PO2ART 53 (L) 10/31/2019 1456   HCO3 26.1 10/31/2019 1456   TCO2 27 10/31/2019 1456   O2SAT 87.0 10/31/2019 1456     Coagulation Profile: No results for input(s): INR, PROTIME in the last 168 hours.  Cardiac Enzymes: No results for input(s): CKTOTAL, CKMB, CKMBINDEX, TROPONINI in the last 168 hours.  HbA1C: Hgb A1c MFr Bld  Date/Time Value Ref Range Status  11/02/2019 07:50 AM 6.9 (H) 4.8 - 5.6 % Final    Comment:    (NOTE) Pre diabetes:          5.7%-6.4%  Diabetes:              >6.4%  Glycemic control for   <7.0% adults with diabetes     CBG: Recent Labs  Lab 11/04/19 1510 11/04/19 2007 11/05/19 0013 11/05/19 0407 11/05/19 0810  GLUCAP 125* 151* 144* 141* 167*     Critical care time:    Performed by: 01/05/20  Total critical care time: 30 minutes  Critical care time was exclusive of separately billable procedures and treating other patients.  Critical care was necessary to treat or prevent imminent or life-threatening deterioration.  Critical care was time spent personally by me on the following activities: development of treatment plan with patient and/or surrogate as well as nursing, discussions with consultants, evaluation of patient's response to treatment, examination of patient, obtaining history from patient or surrogate, ordering and performing treatments and interventions, ordering and review of laboratory studies, ordering  and review of radiographic studies, pulse oximetry and re-evaluation of patient's condition.

## 2019-11-05 NOTE — Progress Notes (Signed)
Assisted tele visit to patient with mother.  Othell Jaime McEachran, RN  

## 2019-11-05 NOTE — Progress Notes (Signed)
Bilateral upper extremity and lower extremity venous duplexes have been completed. Preliminary results can be found in CV Proc through chart review.   11/05/19 11:52 AM Olen Cordial RVT

## 2019-11-05 DEATH — deceased

## 2019-11-06 LAB — CBC WITH DIFFERENTIAL/PLATELET
Abs Immature Granulocytes: 0.19 10*3/uL — ABNORMAL HIGH (ref 0.00–0.07)
Basophils Absolute: 0 10*3/uL (ref 0.0–0.1)
Basophils Relative: 0 %
Eosinophils Absolute: 0 10*3/uL (ref 0.0–0.5)
Eosinophils Relative: 0 %
HCT: 46.2 % — ABNORMAL HIGH (ref 36.0–46.0)
Hemoglobin: 15.2 g/dL — ABNORMAL HIGH (ref 12.0–15.0)
Immature Granulocytes: 1 %
Lymphocytes Relative: 6 %
Lymphs Abs: 0.9 10*3/uL (ref 0.7–4.0)
MCH: 28.3 pg (ref 26.0–34.0)
MCHC: 32.9 g/dL (ref 30.0–36.0)
MCV: 85.9 fL (ref 80.0–100.0)
Monocytes Absolute: 0.4 10*3/uL (ref 0.1–1.0)
Monocytes Relative: 2 %
Neutro Abs: 14 10*3/uL — ABNORMAL HIGH (ref 1.7–7.7)
Neutrophils Relative %: 91 %
Platelets: 118 10*3/uL — ABNORMAL LOW (ref 150–400)
RBC: 5.38 MIL/uL — ABNORMAL HIGH (ref 3.87–5.11)
RDW: 14.5 % (ref 11.5–15.5)
WBC: 15.5 10*3/uL — ABNORMAL HIGH (ref 4.0–10.5)
nRBC: 0 % (ref 0.0–0.2)

## 2019-11-06 LAB — COMPREHENSIVE METABOLIC PANEL
ALT: 56 U/L — ABNORMAL HIGH (ref 0–44)
AST: 81 U/L — ABNORMAL HIGH (ref 15–41)
Albumin: 2 g/dL — ABNORMAL LOW (ref 3.5–5.0)
Alkaline Phosphatase: 177 U/L — ABNORMAL HIGH (ref 38–126)
Anion gap: 13 (ref 5–15)
BUN: 31 mg/dL — ABNORMAL HIGH (ref 6–20)
CO2: 28 mmol/L (ref 22–32)
Calcium: 9 mg/dL (ref 8.9–10.3)
Chloride: 111 mmol/L (ref 98–111)
Creatinine, Ser: 1.41 mg/dL — ABNORMAL HIGH (ref 0.44–1.00)
GFR calc Af Amer: 57 mL/min — ABNORMAL LOW (ref >60)
GFR calc non Af Amer: 49 mL/min — ABNORMAL LOW (ref >60)
Glucose, Bld: 147 mg/dL — ABNORMAL HIGH (ref 70–99)
Potassium: 4.9 mmol/L (ref 3.5–5.1)
Sodium: 152 mmol/L — ABNORMAL HIGH (ref 135–145)
Total Bilirubin: 1.1 mg/dL (ref 0.3–1.2)
Total Protein: 7 g/dL (ref 6.5–8.1)

## 2019-11-06 LAB — GLUCOSE, CAPILLARY
Glucose-Capillary: 112 mg/dL — ABNORMAL HIGH (ref 70–99)
Glucose-Capillary: 132 mg/dL — ABNORMAL HIGH (ref 70–99)
Glucose-Capillary: 132 mg/dL — ABNORMAL HIGH (ref 70–99)
Glucose-Capillary: 150 mg/dL — ABNORMAL HIGH (ref 70–99)
Glucose-Capillary: 168 mg/dL — ABNORMAL HIGH (ref 70–99)
Glucose-Capillary: 210 mg/dL — ABNORMAL HIGH (ref 70–99)
Glucose-Capillary: 91 mg/dL (ref 70–99)

## 2019-11-06 LAB — C-REACTIVE PROTEIN: CRP: 11.3 mg/dL — ABNORMAL HIGH (ref <1.0)

## 2019-11-06 MED ORDER — DEXTROSE 5 % IV SOLN: INTRAVENOUS | Status: DC

## 2019-11-06 MED ORDER — POLYETHYLENE GLYCOL 3350 17 G PO PACK
17.0000 g | PACK | Freq: Every day | ORAL | Status: DC
  Filled 2019-11-06: qty 1

## 2019-11-06 MED ORDER — DEXMEDETOMIDINE HCL IN NACL 400 MCG/100ML IV SOLN
0.0000 ug/kg/h | INTRAVENOUS | Status: DC
  Administered 2019-11-07: 1 ug/kg/h via INTRAVENOUS
  Administered 2019-11-07: 0.4 ug/kg/h via INTRAVENOUS
  Filled 2019-11-06 (×2): qty 100

## 2019-11-06 MED ORDER — FENTANYL BOLUS VIA INFUSION
50.0000 ug | INTRAVENOUS | Status: DC | PRN
  Administered 2019-11-07 (×5): 50 ug via INTRAVENOUS
  Filled 2019-11-06: qty 50

## 2019-11-06 MED ORDER — CLONAZEPAM 0.5 MG PO TABS
0.5000 mg | ORAL_TABLET | Freq: Two times a day (BID) | ORAL | Status: DC
  Administered 2019-11-06: 0.5 mg via ORAL
  Filled 2019-11-06 (×2): qty 1

## 2019-11-06 MED ORDER — ROCURONIUM BROMIDE 50 MG/5ML IV SOLN
50.0000 mg | Freq: Once | INTRAVENOUS | Status: AC
  Administered 2019-11-06: 50 mg via INTRAVENOUS

## 2019-11-06 MED ORDER — FENTANYL CITRATE (PF) 100 MCG/2ML IJ SOLN: 100.0000 ug | Freq: Once | INTRAMUSCULAR | Status: AC

## 2019-11-06 MED ORDER — ACETAMINOPHEN 325 MG PO TABS
650.0000 mg | ORAL_TABLET | Freq: Four times a day (QID) | ORAL | Status: DC | PRN
  Administered 2019-11-07: 650 mg via ORAL
  Filled 2019-11-06: qty 2

## 2019-11-06 MED ORDER — LORAZEPAM 2 MG/ML IJ SOLN
0.5000 mg | Freq: Once | INTRAMUSCULAR | Status: AC | PRN
  Administered 2019-11-06: 0.5 mg via INTRAVENOUS
  Filled 2019-11-06: qty 1

## 2019-11-06 MED ORDER — MIDAZOLAM HCL 2 MG/2ML IJ SOLN: 2.0000 mg | Freq: Once | INTRAMUSCULAR | Status: AC

## 2019-11-06 MED ORDER — FENTANYL CITRATE (PF) 100 MCG/2ML IJ SOLN
INTRAMUSCULAR | Status: AC
  Administered 2019-11-06: 100 ug via INTRAVENOUS
  Filled 2019-11-06: qty 2

## 2019-11-06 MED ORDER — FENTANYL CITRATE (PF) 100 MCG/2ML IJ SOLN: 50.0000 ug | Freq: Once | INTRAMUSCULAR | Status: DC

## 2019-11-06 MED ORDER — ETOMIDATE 2 MG/ML IV SOLN
40.0000 mg | Freq: Once | INTRAVENOUS | Status: AC
  Administered 2019-11-06: 40 mg via INTRAVENOUS

## 2019-11-06 MED ORDER — MIDAZOLAM HCL 2 MG/2ML IJ SOLN
INTRAMUSCULAR | Status: AC
  Administered 2019-11-06: 2 mg via INTRAVENOUS
  Filled 2019-11-06: qty 2

## 2019-11-06 MED ORDER — ETOMIDATE 2 MG/ML IV SOLN
20.0000 mg | Freq: Once | INTRAVENOUS | Status: DC
Start: 2019-11-07 — End: 2019-11-06

## 2019-11-06 MED ORDER — FENTANYL 2500MCG IN NS 250ML (10MCG/ML) PREMIX INFUSION
50.0000 ug/h | INTRAVENOUS | Status: DC
  Administered 2019-11-07: 50 ug/h via INTRAVENOUS
  Filled 2019-11-06: qty 250

## 2019-11-06 MED ORDER — DOCUSATE SODIUM 50 MG/5ML PO LIQD
100.0000 mg | Freq: Two times a day (BID) | ORAL | Status: DC
  Administered 2019-11-07 (×3): 100 mg via ORAL
  Filled 2019-11-06 (×3): qty 10

## 2019-11-06 NOTE — Progress Notes (Addendum)
eLink Physician-Brief Progress Note Patient Name: SHAI RASMUSSEN DOB: 1985/10/12 MRN: [id number]   Date of Service  11/06/2019  HPI/Events of Note  Patient struggling on BiPAP. Nursing requesting sedation. I am not interested in sedating this patient without intubation. It may well be time for intubation. Sat = 85-87 and RR 37-40.  eICU Interventions  Plan: 1. Will request that the ground team evaluate the patient at bedside.      Intervention Category Major Interventions: Respiratory failure - evaluation and management;Hypoxemia - evaluation and management  Lenell Antu 11/06/2019, 8:43 PM

## 2019-11-06 NOTE — Progress Notes (Signed)
NAME:  Bianca Cantrell, MRN:  [id number], DOB:  1986/02/08, LOS: 7 ADMISSION DATE:  10/14/2019, CONSULTATION DATE:  10/18/2019 REFERRING MD:  Hyman Hopes [facility] - ED [facility], CHIEF COMPLAINT:  Dyspnea.    HPI/course in hospital  34 year old woman unvaccinated for COVID. 9 day history of fever, myalgias and increasing dyspnea.   Presented to urgent care but found to be hypoxic with saturations into the 70's with severe distress.   Brought to ED and started on BIPAP. Once in the ED she was de-escalated to HFNC and tolerated well. She was admitted to the hospitalists for tx of COVID-19 related ARDS. On 8/27 she became more fatigued with worsening hypoxia and PCCM was asked to see.   Past Medical History   Past Medical History:  Diagnosis Date   Abnormal Pap smear    HPV   Anemia    Chlamydia    Eczema    Gallstones    Infection    urinary tract infection   No pertinent past medical history    Obesity    Sickle cell trait [facility])      Past Surgical History:  Procedure Laterality Date   APPENDECTOMY  2010   BIOPSY  11/13/2017   Procedure: BIOPSY;  Surgeon: Benancio Deeds, MD;  Location: [facility];  Service: Gastroenterology;;   CHOLECYSTECTOMY  2011   ESOPHAGOGASTRODUODENOSCOPY (EGD) WITH PROPOFOL N/A 11/13/2017   Procedure: ESOPHAGOGASTRODUODENOSCOPY (EGD) WITH PROPOFOL;  Surgeon: Benancio Deeds, MD;  Location: [facility];  Service: Gastroenterology;  Laterality: N/A;     Interim history/subjective:  She remains bipap dependent. She is alert and comfortable. She is having intermittent chest discomfort that lasts minutes. She is complaining of dry mouth. Complains of feeling anxious.  Objective   Blood pressure (!) 120/94, pulse (!) 126, temperature 98 F (36.7 C), temperature source Axillary, resp. rate (!) 38, height 4\' 10"  (1.473 m), weight 129 kg, SpO2 94 %.    Vent Mode: BIPAP FiO2 (%):  [100 %] 100 % Set Rate:  [10 bmp] 10 bmp PEEP:  [8 cmH20] 8 cmH20    Intake/Output Summary (Last 24 hours) at 11/06/2019 1250 Last data filed at 11/06/2019 1146 Gross per 24 hour  Intake 420 ml  Output 2450 ml  Net -2030 ml   Filed Weights   10/09/2019 1517  Weight: 129 kg    Examination: General: obese adult female lying in bed, bipap in place HEENT: BiPAP, MM dryt, PERRL, sclera nonicteric Neuro: Alert and oriented, able to follow all commands CV: s1s2 regular rate and rhythm, no murmur, rubs, or gallops,  PULM: Tachypnea, diminished breath sounds GI: soft, bowel sounds active in all 4 quadrants, non-tender, non-distended,  Extremities: warm/dry, no edema  Skin: no rashes or lesions  Ancillary tests (personally reviewed)  CBC: Recent Labs  Lab 11/02/19 0750 11/03/19 0233 11/04/19 0504 11/05/19 0555 11/06/19 0651  WBC 9.4 11.2* 13.1* 13.5* 15.5*  NEUTROABS 7.8* 9.4* 12.3* 12.4* 14.0*  HGB 13.0 14.7 14.4 15.3* 15.2*  HCT 40.1 44.5 43.5 46.1* 46.2*  MCV 84.4 83.6 84.5 84.9 85.9  PLT 199 214 166 132* 118*    Basic Metabolic Panel: Recent Labs  Lab 11/02/19 0750 11/03/19 0233 11/04/19 0504 11/05/19 0555 11/06/19 0651  NA 139 144 143 149* 152*  K 3.7 4.1 4.1 4.7 4.9  CL 103 106 106 109 111  CO2 25 26 26 28 28   GLUCOSE 193* 169* 150* 170* 147*  BUN 54* 50* 38* 32* 31*  CREATININE  1.67* 1.57* 1.28* 1.33* 1.41*  CALCIUM 8.0* 8.4* 8.2* 8.7* 9.0  MG 3.6*  --   --   --   --    GFR: Estimated Creatinine Clearance: 68.8 mL/min (A) (by C-G formula based on SCr of 1.41 mg/dL (H)). Recent Labs  Lab 10/29/2019 1555 10/19/2019 1606 10/31/19 0040 11/03/19 0233 11/04/19 0504 11/05/19 0555 11/06/19 0651  PROCALCITON 1.02  --   --   --   --   --   --   WBC 6.6  --    < > 11.2* 13.1* 13.5* 15.5*  LATICACIDVEN  --  1.8  --   --   --   --   --    < > = values in this interval not displayed.    Liver Function Tests: Recent Labs  Lab 11/02/19 0750 11/03/19 0233 11/04/19 0504 11/05/19 0555 11/06/19 0651  AST 172* 150* 111* 93* 81*   ALT 83* 90* 76* 71* 56*  ALKPHOS 66 86 132* 156* 177*  BILITOT 0.6 0.7 0.9 0.8 1.1  PROT 5.9* 6.3* 6.4* 7.1 7.0  ALBUMIN 2.4* 2.3* 2.1* 2.1* 2.0*   No results for input(s): LIPASE, AMYLASE in the last 168 hours. No results for input(s): AMMONIA in the last 168 hours.  ABG    Component Value Date/Time   PHART 7.401 10/31/2019 1456   PCO2ART 42.0 10/31/2019 1456   PO2ART 53 (L) 10/31/2019 1456   HCO3 26.1 10/31/2019 1456   TCO2 27 10/31/2019 1456   O2SAT 87.0 10/31/2019 1456     Coagulation Profile: No results for input(s): INR, PROTIME in the last 168 hours.  Cardiac Enzymes: No results for input(s): CKTOTAL, CKMB, CKMBINDEX, TROPONINI in the last 168 hours.  HbA1C: Hgb A1c MFr Bld  Date/Time Value Ref Range Status  11/02/2019 07:50 AM 6.9 (H) 4.8 - 5.6 % Final    Comment:    (NOTE) Pre diabetes:          5.7%-6.4%  Diabetes:              >6.4%  Glycemic control for   <7.0% adults with diabetes     CBG: Recent Labs  Lab 11/05/19 1955 11/06/19 0002 11/06/19 0355 11/06/19 0816 11/06/19 1140  GLUCAP 166* 112* 132* 150* 132*     Assessment & Plan:   ARDS due to COVID 19 pneumonia Morbid obesity Acute Hypoxic / Hypercapnic Respiratory Failure  -Bipap dependent - Patient is at very high risk for needing intubation - Encourage self prone, sats improve - Follow cultures (NGTD) - Continue remdesivir, baricitnb, and IV steroids - S/p empiric CAP coverage end 8/31  Acute Kidney Injury, improving -in the setting of Covid pneumonia. creatinine on admission 1.80, trending down, stable at ~1.4 today (0.6 in 2019) - Follow renal function / urine output - Avoid nephrotoxins, ensure adequate renal perfusion  - Strict intake and output - IV fluids starting, D5 at 115ml/hr for hypernatremia  Hypernatremia - In setting of increased free water loss from infection and poor oral intake due to high oxygen requirements - Free water deficit 5.5L - Start D5W at  158mL/hr - Will bolus D5W if needed intermittently  Transaminitis, improving Trend LFTs  Diarrhea: Likely related to COVID. Treat with lomotil for now, no suspicion for Cdiff, will monitor.  Anxiety - Due to dyspnea - Start clonazepam 0.5mg  twice daily  Best practice  :  Diet: N.p.o. while on BiPAP Pain/Anxiety/Delirium protocol (if indicated): As needed VAP protocol (if indicated): In place DVT  prophylaxis: Lovenox GI prophylaxis: PPI Glucose control: SSI Mobility: Bedrest  Code Status: Full Family Communication: Updated by patient  Disposition: ICU  Critical care time:    Performed by: Martina Sinner  Total critical care time: 35 minutes  Critical care time was exclusive of separately billable procedures and treating other patients.  Critical care was necessary to treat or prevent imminent or life-threatening deterioration.  Critical care was time spent personally by me on the following activities: development of treatment plan with patient and/or surrogate as well as nursing, discussions with consultants, evaluation of patient's response to treatment, examination of patient, obtaining history from patient or surrogate, ordering and performing treatments and interventions, ordering and review of laboratory studies, ordering and review of radiographic studies, pulse oximetry and re-evaluation of patient's condition.  Melody Comas, MD [facility] Office: [phone]   See Amion for Pager Details

## 2019-11-07 ENCOUNTER — Inpatient Hospital Stay (HOSPITAL_COMMUNITY): Payer: BC Managed Care – PPO

## 2019-11-07 ENCOUNTER — Encounter (HOSPITAL_COMMUNITY): Payer: Self-pay | Admitting: Pulmonary Disease

## 2019-11-07 LAB — URINALYSIS, COMPLETE (UACMP) WITH MICROSCOPIC
Bilirubin Urine: NEGATIVE
Glucose, UA: 50 mg/dL — AB
Ketones, ur: NEGATIVE mg/dL
Leukocytes,Ua: NEGATIVE
Nitrite: NEGATIVE
Protein, ur: 100 mg/dL — AB
Specific Gravity, Urine: 1.012 (ref 1.005–1.030)
pH: 5 (ref 5.0–8.0)

## 2019-11-07 LAB — GLUCOSE, CAPILLARY
Glucose-Capillary: 260 mg/dL — ABNORMAL HIGH (ref 70–99)
Glucose-Capillary: 146 mg/dL — ABNORMAL HIGH (ref 70–99)
Glucose-Capillary: 131 mg/dL — ABNORMAL HIGH (ref 70–99)
Glucose-Capillary: 177 mg/dL — ABNORMAL HIGH (ref 70–99)
Glucose-Capillary: 183 mg/dL — ABNORMAL HIGH (ref 70–99)

## 2019-11-07 LAB — COMPREHENSIVE METABOLIC PANEL
ALT: 49 U/L — ABNORMAL HIGH (ref 0–44)
AST: 62 U/L — ABNORMAL HIGH (ref 15–41)
Albumin: 2 g/dL — ABNORMAL LOW (ref 3.5–5.0)
Alkaline Phosphatase: 149 U/L — ABNORMAL HIGH (ref 38–126)
Anion gap: 9 (ref 5–15)
BUN: 38 mg/dL — ABNORMAL HIGH (ref 6–20)
CO2: 30 mmol/L (ref 22–32)
Calcium: 8.3 mg/dL — ABNORMAL LOW (ref 8.9–10.3)
Chloride: 107 mmol/L (ref 98–111)
Creatinine, Ser: 1.74 mg/dL — ABNORMAL HIGH (ref 0.44–1.00)
GFR calc Af Amer: 44 mL/min — ABNORMAL LOW (ref >60)
GFR calc non Af Amer: 38 mL/min — ABNORMAL LOW (ref >60)
Glucose, Bld: 295 mg/dL — ABNORMAL HIGH (ref 70–99)
Potassium: 5.8 mmol/L — ABNORMAL HIGH (ref 3.5–5.1)
Sodium: 146 mmol/L — ABNORMAL HIGH (ref 135–145)
Total Bilirubin: 0.8 mg/dL (ref 0.3–1.2)
Total Protein: 6.5 g/dL (ref 6.5–8.1)

## 2019-11-07 LAB — POCT I-STAT 7, (LYTES, BLD GAS, ICA,H+H)
Acid-Base Excess: 4 mmol/L — ABNORMAL HIGH (ref 0.0–2.0)
Acid-Base Excess: 0 mmol/L (ref 0.0–2.0)
Acid-base deficit: 3 mmol/L — ABNORMAL HIGH (ref 0.0–2.0)
Bicarbonate: 32.8 mmol/L — ABNORMAL HIGH (ref 20.0–28.0)
Bicarbonate: 28 mmol/L (ref 20.0–28.0)
Bicarbonate: 27.9 mmol/L (ref 20.0–28.0)
Calcium, Ion: 1.19 mmol/L (ref 1.15–1.40)
Calcium, Ion: 1.07 mmol/L — ABNORMAL LOW (ref 1.15–1.40)
Calcium, Ion: 1.06 mmol/L — ABNORMAL LOW (ref 1.15–1.40)
HCT: 43 % (ref 36.0–46.0)
HCT: 42 % (ref 36.0–46.0)
HCT: 41 % (ref 36.0–46.0)
Hemoglobin: 14.6 g/dL (ref 12.0–15.0)
Hemoglobin: 14.3 g/dL (ref 12.0–15.0)
Hemoglobin: 13.9 g/dL (ref 12.0–15.0)
O2 Saturation: 88 %
O2 Saturation: 94 %
O2 Saturation: 76 %
Patient temperature: 98.6
Patient temperature: 101.2
Potassium: 5.4 mmol/L — ABNORMAL HIGH (ref 3.5–5.1)
Potassium: 6.1 mmol/L — ABNORMAL HIGH (ref 3.5–5.1)
Potassium: 5.6 mmol/L — ABNORMAL HIGH (ref 3.5–5.1)
Sodium: 144 mmol/L (ref 135–145)
Sodium: 141 mmol/L (ref 135–145)
Sodium: 142 mmol/L (ref 135–145)
TCO2: 35 mmol/L — ABNORMAL HIGH (ref 22–32)
TCO2: 30 mmol/L (ref 22–32)
TCO2: 30 mmol/L (ref 22–32)
pCO2 arterial: 70.2 mmHg (ref 32.0–48.0)
pCO2 arterial: 56.4 mmHg — ABNORMAL HIGH (ref 32.0–48.0)
pCO2 arterial: 84.3 mmHg (ref 32.0–48.0)
pH, Arterial: 7.277 — ABNORMAL LOW (ref 7.350–7.450)
pH, Arterial: 7.304 — ABNORMAL LOW (ref 7.350–7.450)
pH, Arterial: 7.137 — CL (ref 7.350–7.450)
pO2, Arterial: 64 mmHg — ABNORMAL LOW (ref 83.0–108.0)
pO2, Arterial: 80 mmHg — ABNORMAL LOW (ref 83.0–108.0)
pO2, Arterial: 60 mmHg — ABNORMAL LOW (ref 83.0–108.0)

## 2019-11-07 LAB — BASIC METABOLIC PANEL
Anion gap: 12 (ref 5–15)
BUN: 59 mg/dL — ABNORMAL HIGH (ref 6–20)
CO2: 24 mmol/L (ref 22–32)
Calcium: 7.8 mg/dL — ABNORMAL LOW (ref 8.9–10.3)
Chloride: 106 mmol/L (ref 98–111)
Creatinine, Ser: 3.25 mg/dL — ABNORMAL HIGH (ref 0.44–1.00)
GFR calc Af Amer: 21 mL/min — ABNORMAL LOW (ref >60)
GFR calc non Af Amer: 18 mL/min — ABNORMAL LOW (ref >60)
Glucose, Bld: 182 mg/dL — ABNORMAL HIGH (ref 70–99)
Potassium: 6.3 mmol/L (ref 3.5–5.1)
Sodium: 142 mmol/L (ref 135–145)

## 2019-11-07 LAB — NA AND K (SODIUM & POTASSIUM), RAND UR
Potassium Urine: 46 mmol/L
Sodium, Ur: 46 mmol/L

## 2019-11-07 LAB — POTASSIUM
Potassium: 6.3 mmol/L (ref 3.5–5.1)
Potassium: 5.6 mmol/L — ABNORMAL HIGH (ref 3.5–5.1)
Potassium: 5.9 mmol/L — ABNORMAL HIGH (ref 3.5–5.1)

## 2019-11-07 LAB — C-REACTIVE PROTEIN: CRP: 7.6 mg/dL — ABNORMAL HIGH (ref <1.0)

## 2019-11-07 LAB — OSMOLALITY, URINE: Osmolality, Ur: 346 mOsm/kg (ref 300–900)

## 2019-11-07 LAB — TRIGLYCERIDES: Triglycerides: 320 mg/dL — ABNORMAL HIGH (ref <150)

## 2019-11-07 LAB — CREATININE, URINE, RANDOM: Creatinine, Urine: 61.01 mg/dL

## 2019-11-07 LAB — PROCALCITONIN: Procalcitonin: 0.37 ng/mL

## 2019-11-07 MED ORDER — SODIUM BICARBONATE 8.4 % IV SOLN: 100.0000 meq | Freq: Once | INTRAVENOUS | Status: AC

## 2019-11-07 MED ORDER — FREE WATER
200.0000 mL | Freq: Four times a day (QID) | Status: DC
  Administered 2019-11-07 – 2019-11-08 (×3): 200 mL

## 2019-11-07 MED ORDER — SODIUM BICARBONATE 8.4 % IV SOLN
INTRAVENOUS | Status: AC
  Administered 2019-11-08: 100 meq via INTRAVENOUS
  Filled 2019-11-07: qty 100

## 2019-11-07 MED ORDER — PROPOFOL 1000 MG/100ML IV EMUL
INTRAVENOUS | Status: AC
  Administered 2019-11-07: 25 ug/kg/min via INTRAVENOUS
  Filled 2019-11-07: qty 100

## 2019-11-07 MED ORDER — SODIUM ZIRCONIUM CYCLOSILICATE 5 G PO PACK
10.0000 g | PACK | Freq: Once | ORAL | Status: AC
  Administered 2019-11-07: 10 g
  Filled 2019-11-07: qty 2

## 2019-11-07 MED ORDER — ACETAMINOPHEN 160 MG/5ML PO SOLN
650.0000 mg | Freq: Four times a day (QID) | ORAL | Status: DC | PRN
  Administered 2019-11-08: 650 mg via ORAL
  Filled 2019-11-07: qty 20.3

## 2019-11-07 MED ORDER — ARTIFICIAL TEARS OPHTHALMIC OINT
1.0000 "application " | TOPICAL_OINTMENT | Freq: Three times a day (TID) | OPHTHALMIC | Status: DC
  Administered 2019-11-07 (×3): 1 via OPHTHALMIC

## 2019-11-07 MED ORDER — SODIUM ZIRCONIUM CYCLOSILICATE 5 G PO PACK
10.0000 g | PACK | Freq: Once | ORAL | Status: AC
  Administered 2019-11-07: 10 g via ORAL
  Filled 2019-11-07: qty 2

## 2019-11-07 MED ORDER — VECURONIUM BROMIDE 10 MG IV SOLR
INTRAVENOUS | Status: AC
  Administered 2019-11-07: 10 mg via INTRAVENOUS
  Filled 2019-11-07: qty 10

## 2019-11-07 MED ORDER — SODIUM CHLORIDE 0.9% FLUSH
10.0000 mL | Freq: Two times a day (BID) | INTRAVENOUS | Status: DC
  Administered 2019-11-07 (×3): 10 mL

## 2019-11-07 MED ORDER — VECURONIUM BROMIDE 10 MG IV SOLR
0.1000 mg/kg | INTRAVENOUS | Status: DC | PRN
  Administered 2019-11-07 (×4): 12.9 mg via INTRAVENOUS
  Filled 2019-11-07 (×4): qty 20

## 2019-11-07 MED ORDER — NOREPINEPHRINE 4 MG/250ML-% IV SOLN
2.0000 ug/min | INTRAVENOUS | Status: DC
  Administered 2019-11-07: 2 ug/min via INTRAVENOUS
  Filled 2019-11-07: qty 250

## 2019-11-07 MED ORDER — FENTANYL BOLUS VIA INFUSION
50.0000 ug | INTRAVENOUS | Status: DC | PRN
  Administered 2019-11-07 (×2): 50 ug via INTRAVENOUS
  Filled 2019-11-07: qty 50

## 2019-11-07 MED ORDER — VITAL 1.5 CAL PO LIQD
1000.0000 mL | ORAL | Status: DC
  Administered 2019-11-07: 1000 mL
  Filled 2019-11-07: qty 1000

## 2019-11-07 MED ORDER — PROSOURCE TF PO LIQD
90.0000 mL | Freq: Three times a day (TID) | ORAL | Status: DC
  Administered 2019-11-07 (×3): 90 mL
  Filled 2019-11-07 (×3): qty 90

## 2019-11-07 MED ORDER — MIDAZOLAM HCL 2 MG/2ML IJ SOLN
2.0000 mg | INTRAMUSCULAR | Status: DC | PRN
  Administered 2019-11-07: 2 mg via INTRAVENOUS
  Filled 2019-11-07: qty 2

## 2019-11-07 MED ORDER — VECURONIUM BROMIDE 10 MG IV SOLR: 10.0000 mg | Freq: Once | INTRAVENOUS | Status: AC

## 2019-11-07 MED ORDER — MIDAZOLAM HCL 2 MG/2ML IJ SOLN
2.0000 mg | Freq: Once | INTRAMUSCULAR | Status: AC
  Administered 2019-11-07: 2 mg via INTRAVENOUS

## 2019-11-07 MED ORDER — ORAL CARE MOUTH RINSE
15.0000 mL | OROMUCOSAL | Status: DC
  Administered 2019-11-07 – 2019-11-08 (×10): 15 mL via OROMUCOSAL

## 2019-11-07 MED ORDER — PANTOPRAZOLE SODIUM 40 MG IV SOLR
40.0000 mg | INTRAVENOUS | Status: DC
  Administered 2019-11-07: 40 mg via INTRAVENOUS
  Filled 2019-11-07: qty 40

## 2019-11-07 MED ORDER — BARICITINIB 2 MG PO TABS
2.0000 mg | ORAL_TABLET | Freq: Every day | ORAL | Status: DC
  Administered 2019-11-07: 2 mg via ORAL
  Filled 2019-11-07: qty 1

## 2019-11-07 MED ORDER — NOREPINEPHRINE 4 MG/250ML-% IV SOLN
60.0000 ug/min | INTRAVENOUS | Status: DC
  Administered 2019-11-07 (×2): 8 ug/min via INTRAVENOUS
  Administered 2019-11-07: 5 ug/min via INTRAVENOUS
  Administered 2019-11-08: 26 ug/min via INTRAVENOUS
  Filled 2019-11-07 (×3): qty 250

## 2019-11-07 MED ORDER — CHLORHEXIDINE GLUCONATE 0.12% ORAL RINSE (MEDLINE KIT)
15.0000 mL | Freq: Two times a day (BID) | OROMUCOSAL | Status: DC
  Administered 2019-11-07 (×3): 15 mL via OROMUCOSAL

## 2019-11-07 MED ORDER — NOREPINEPHRINE 4 MG/250ML-% IV SOLN: 0.0000 ug/min | INTRAVENOUS | Status: DC

## 2019-11-07 MED ORDER — PROPOFOL 1000 MG/100ML IV EMUL
25.0000 ug/kg/min | INTRAVENOUS | Status: DC
  Administered 2019-11-07: 40 ug/kg/min via INTRAVENOUS
  Administered 2019-11-07: 45 ug/kg/min via INTRAVENOUS
  Administered 2019-11-07 (×2): 40 ug/kg/min via INTRAVENOUS
  Administered 2019-11-07: 70 ug/kg/min via INTRAVENOUS
  Administered 2019-11-07: 45 ug/kg/min via INTRAVENOUS
  Administered 2019-11-07 – 2019-11-08 (×3): 70 ug/kg/min via INTRAVENOUS
  Filled 2019-11-07 (×7): qty 100
  Filled 2019-11-07: qty 200
  Filled 2019-11-07: qty 100

## 2019-11-07 MED ORDER — FUROSEMIDE 10 MG/ML IJ SOLN
40.0000 mg | Freq: Once | INTRAMUSCULAR | Status: AC
  Administered 2019-11-07: 40 mg via INTRAVENOUS
  Filled 2019-11-07: qty 4

## 2019-11-07 MED ORDER — FENTANYL CITRATE (PF) 100 MCG/2ML IJ SOLN: 50.0000 ug | Freq: Once | INTRAMUSCULAR | Status: DC

## 2019-11-07 MED ORDER — CALCIUM GLUCONATE-NACL 1-0.675 GM/50ML-% IV SOLN
1.0000 g | Freq: Once | INTRAVENOUS | Status: AC
  Administered 2019-11-07: 1000 mg via INTRAVENOUS
  Filled 2019-11-07: qty 50

## 2019-11-07 MED ORDER — FENTANYL 2500MCG IN NS 250ML (10MCG/ML) PREMIX INFUSION
0.0000 ug/h | INTRAVENOUS | Status: DC
  Administered 2019-11-07: 275 ug/h via INTRAVENOUS
  Administered 2019-11-07: 250 ug/h via INTRAVENOUS
  Administered 2019-11-07: 200 ug/h via INTRAVENOUS
  Filled 2019-11-07 (×3): qty 250

## 2019-11-07 MED ORDER — SODIUM CHLORIDE 0.9 % IV SOLN
250.0000 mL | INTRAVENOUS | Status: DC
  Administered 2019-11-07: 250 mL via INTRAVENOUS

## 2019-11-07 MED ORDER — ARTIFICIAL TEARS OPHTHALMIC OINT
1.0000 "application " | TOPICAL_OINTMENT | Freq: Three times a day (TID) | OPHTHALMIC | Status: DC
  Filled 2019-11-07: qty 3.5

## 2019-11-07 MED ORDER — STERILE WATER FOR INJECTION IV SOLN
INTRAVENOUS | Status: DC
  Filled 2019-11-07: qty 850

## 2019-11-07 MED ORDER — MIDAZOLAM HCL 2 MG/2ML IJ SOLN
2.0000 mg | INTRAMUSCULAR | Status: AC | PRN
  Administered 2019-11-07 (×2): 2 mg via INTRAVENOUS
  Filled 2019-11-07 (×2): qty 2

## 2019-11-07 MED ORDER — SODIUM CHLORIDE 0.9% FLUSH: 10.0000 mL | INTRAVENOUS | Status: DC | PRN

## 2019-11-07 MED ORDER — MIDAZOLAM HCL 2 MG/2ML IJ SOLN
INTRAMUSCULAR | Status: AC
  Filled 2019-11-07: qty 2

## 2019-11-07 NOTE — Progress Notes (Signed)
eLink Physician-Brief Progress Note Patient Name: Bianca Cantrell DOB: April 30, 1985 MRN: [id number]   Date of Service  11/07/2019  HPI/Events of Note  Called by bedside nurse to straighten out orders for adequate sedation of proning and intermittent NMB if needed.   eICU Interventions  Plan: 1. D/C Precedex IV infusion.  2. Adult ICU intermittent NMB protocol  - Sedation with Fentanyl and Propofol IV infusions. Titration per protocol.     Intervention Category Major Interventions: Hypoxemia - evaluation and management;Respiratory failure - evaluation and management  Trammell Bowden Dennard Nip 11/07/2019, 3:46 AM

## 2019-11-07 NOTE — Progress Notes (Signed)
Pts head turned to the left and arms rotated. 

## 2019-11-07 NOTE — Procedures (Signed)
Arterial Catheter Insertion Procedure Note  Bianca Cantrell  [id number]  01-Jan-1986  Date:11/07/19  Time:11:49 AM    Provider Performing: Martina Sinner    Procedure: Insertion of Arterial Line (95320) with US guidance (23343)   Indication(s) Blood pressure monitoring and/or need for frequent ABGs  Consent Unable to obtain consent due to emergent nature of procedure.  Anesthesia None   Time Out Verified patient identification, verified procedure, site/side was marked, verified correct patient position, special equipment/implants available, medications/allergies/relevant history reviewed, required imaging and test results available.   Sterile Technique Maximal sterile technique including full sterile barrier drape, hand hygiene, sterile gown, sterile gloves, mask, hair covering, sterile ultrasound probe cover (if used).   Procedure Description Area of catheter insertion was cleaned with chlorhexidine and draped in sterile fashion. With real-time ultrasound guidance an arterial catheter was placed into the right radial artery.  Appropriate arterial tracings confirmed on monitor.     Complications/Tolerance None; patient tolerated the procedure well.   EBL Minimal   Specimen(s) None

## 2019-11-07 NOTE — Progress Notes (Signed)
eLink Physician-Brief Progress Note Patient Name: MANNA GOSE DOB: [birth year] MRN: [id number]   Date of Service  11/07/2019  HPI/Events of Note  ABG on 100%/PRVC 35/TV 380/P 14 = 7.137/84.3/60.   eICU Interventions  Plan: 1. NaHCO3 100 meq IV now. 2. NaHCO3 IV infusion to run at 100 mL/hour.  3. Repeat ABG at 5 AM.      Intervention Category Major Interventions: Acid-Base disturbance - evaluation and management;Respiratory failure - evaluation and management  Lenell Antu 11/07/2019, 11:52 PM

## 2019-11-07 NOTE — Progress Notes (Signed)
Attempted arterial line three times and each time was unable to thread catheter. Pressure held each time with no hematoma's. Will notify CCM.

## 2019-11-07 NOTE — Procedures (Signed)
Intubation Procedure Note  KENNY STERN  [id number]  1985/12/14  Date:11/07/19  Time:12:17 AM   Provider Performing:Justis Dupas L Fidelia Cathers    Procedure: Intubation (31500)  Indication(s) Respiratory Failure  Consent Risks of the procedure as well as the alternatives and risks of each were explained to the patient and/or caregiver.  Consent for the procedure was obtained and is signed in the bedside chart   Anesthesia Etomidate, Versed, Fentanyl and Rocuronium   Time Out Verified patient identification, verified procedure, site/side was marked, verified correct patient position, special equipment/implants available, medications/allergies/relevant history reviewed, required imaging and test results available.   Sterile Technique Usual hand hygeine, masks, and gloves were used   Procedure Description Patient positioned in bed supine.  Sedation given as noted above.  Patient was intubated with endotracheal tube using Glidescope.  View was Grade 1 full glottis .  Number of attempts was 1.  Colorimetric CO2 detector was consistent with tracheal placement.   Complications/Tolerance None; patient tolerated the procedure well. Chest X-ray is ordered to verify placement.   EBL Minimal   Specimen(s) None  Josephine Igo, DO [facility] 11/07/2019 12:18 AM

## 2019-11-07 NOTE — Procedures (Signed)
Central Venous Catheter Insertion Procedure Note  JAKIYAH STEPNEY  [id number]  09-Nov-1985  Date:11/07/19  Time:1:09 AM   Provider Performing:Frantz Quattrone R Yared Barefoot   Procedure: Insertion of Non-tunneled Central Venous Catheter(36556) with US guidance (55732)   Indication(s) Medication administration and Difficult access  Consent Unable to obtain consent due to emergent nature of procedure.  Anesthesia Topical only with 1% lidocaine   Timeout Verified patient identification, verified procedure, site/side was marked, verified correct patient position, special equipment/implants available, medications/allergies/relevant history reviewed, required imaging and test results available.  Sterile Technique Maximal sterile technique including full sterile barrier drape, hand hygiene, sterile gown, sterile gloves, mask, hair covering, sterile ultrasound probe cover (if used).  Procedure Description Area of catheter insertion was cleaned with chlorhexidine and draped in sterile fashion.  With real-time ultrasound guidance a central venous catheter was placed into the left internal jugular vein. Nonpulsatile blood flow and easy flushing noted in all ports.  The catheter was sutured in place and sterile dressing applied.  Complications/Tolerance None; patient tolerated the procedure well. Chest X-ray is ordered to verify placement for internal jugular or subclavian cannulation.   Chest x-ray is not ordered for femoral cannulation.  EBL Minimal  Specimen(s) None     Darcella Gasman Andie Mortimer, PA-C

## 2019-11-07 NOTE — Progress Notes (Signed)
Pt flipped to the prone position at 0500. tolerating well at this time.

## 2019-11-07 NOTE — Progress Notes (Signed)
Assisted tele visit to patient with mother.  Phelix Fudala McEachran, RN  

## 2019-11-07 NOTE — Progress Notes (Signed)
PCCM:  Patient seen at bedside. Continued decompensation.  Saturations in 70s-80s on BIPAP  Tachyapenic RR in 40s At this point I feel we have no other choice but to proceed with intubation  Consent obtained Video conference with mother   BP (!) 147/98   Pulse (!) 128   Temp 99.4 F (37.4 C) (Axillary)   Resp (!) 32   Ht 5' (1.524 m)   Wt 129 kg   SpO2 (!) 87%   BMI 55.54 kg/m   Gen: obese fm, on BIPAP Heart: tachy, s1 s2  Lungs: BL vented breaths on nippv   Cxr: reviewed, BL infiltrates   A:  AHRF 2/2 covid19 PNA ARDS   P: Endotracheal intubation  Likely need proned  orderset placed   This patient is critically ill with multiple organ system failure; which, requires frequent high complexity decision making, assessment, support, evaluation, and titration of therapies. This was completed through the application of advanced monitoring technologies and extensive interpretation of multiple databases. During this encounter critical care time was devoted to patient care services described in this note for 32 minutes.  Josephine Igo, DO [facility] 11/07/2019 12:16 AM

## 2019-11-07 NOTE — Consult Note (Signed)
ECMO Consult Note   Called to 7m4 for ECMO Consult at (time) 1:51 PM by DErin Fulling MD Admitting Diagnosis- covid viral pneumonia, acute respiratory failure Primary Issue- acute hypoxic respiratory failure Age:34 y.o. Weight: 129kg   Days on Mechanical Ventilation- 1  MAP FiO2 Oxygen Index P/F Ratio  81 100% 21.5 80    Vasopressors yes   MSOF Yes   RESP score (VV-ECMO) : http://www.respscore.com  SAVE score (VA-ECMO) : http://www.save-score.com  Recent Blood Gas:     Component Value Date/Time   PHART 7.277 (L) 11/07/2019 0201   PCO2ART 70.2 (HH) 11/07/2019 0201   PO2ART 64 (L) 11/07/2019 0201   HCO3 32.8 (H) 11/07/2019 0201   TCO2 35 (H) 11/07/2019 0201   O2SAT 88.0 11/07/2019 0201    Coags:    Component Value Date/Time   FIBRINOGEN 715 (H) 10/12/2019 1555    CBC    Component Value Date/Time   WBC 17.7 (H) 11/07/2019 0306   RBC 4.95 11/07/2019 0306   HGB 14.1 11/07/2019 0306   HGB 10.6 (L) 04/17/2019 1629   HCT 45.6 11/07/2019 0306   HCT 31.9 (L) 04/17/2019 1629   PLT 92 (L) 11/07/2019 0306   PLT 291 04/17/2019 1629   MCV 92.1 11/07/2019 0306   MCV 89 04/17/2019 1629   MCH 28.5 11/07/2019 0306   MCHC 30.9 11/07/2019 0306   RDW 14.6 11/07/2019 0306   RDW 14.4 04/17/2019 1629   LYMPHSABS 1.0 11/07/2019 0306   MONOABS 0.3 11/07/2019 0306   EOSABS 0.0 11/07/2019 0306   BASOSABS 0.1 11/07/2019 0306    BMET    Component Value Date/Time   NA 146 (H) 11/07/2019 0306   K 5.8 (H) 11/07/2019 0306   CL 107 11/07/2019 0306   CO2 30 11/07/2019 0306   GLUCOSE 295 (H) 11/07/2019 0306   BUN 38 (H) 11/07/2019 0306   CREATININE 1.74 (H) 11/07/2019 0306   CREATININE 0.59 02/20/2013 1546   CALCIUM 8.3 (L) 11/07/2019 0306   GFRNONAA 38 (L) 11/07/2019 0306   GFRAA 44 (L) 11/07/2019 0306                                                                                                                                                             ECMO physician PErnest Mallick DO notified at 1:51 PM (time) Candidate meets ECMO Criteria- No  Placed not on ECMO watch.  Additional notes- worsening AKI, BMI >40 in covid + patient Reason for Consult: ARDS Referring Physician: DCollene Gobbleis an 34y.o. female.  HPI: Mrs. HLaverle Hobbyis a 34year old woman presenting with COVID-19 viral pneumonia and ARDS on 10/11/2019 who had progressive hypoxic respiratory failure requiring transfer to the ICU.  She eventually required intubation overnight on 11/06/2019 and has now progressed to requiring prone  ventilation and heavy sedation.  ECMO consult for progressive hypoxic and hypercapnic respiratory failure. Former smoker, quit in 2020.  Past Medical History:  Diagnosis Date  . Abnormal Pap smear    HPV  . Anemia   . Chlamydia   . Eczema   . Gallstones   . Infection    urinary tract infection  . No pertinent past medical history   . Obesity   . Sickle cell trait [facility])     Past Surgical History:  Procedure Laterality Date  . APPENDECTOMY  2010  . BIOPSY  11/13/2017   Procedure: BIOPSY;  Surgeon: Yetta Flock, MD;  Location: [facility];  Service: Gastroenterology;;  . Reece Agar  . ESOPHAGOGASTRODUODENOSCOPY (EGD) WITH PROPOFOL N/A 11/13/2017   Procedure: ESOPHAGOGASTRODUODENOSCOPY (EGD) WITH PROPOFOL;  Surgeon: Yetta Flock, MD;  Location: [facility];  Service: Gastroenterology;  Laterality: N/A;    Family History  Problem Relation Age of Onset  . Hypertension Mother   . Heart disease Mother   . Hypertension Maternal Grandmother   . Heart disease Maternal Grandmother   . Cancer Maternal Aunt        leukemia  . Cancer Maternal Uncle        pancreatic  . Other Neg Hx   . Colon cancer Neg Hx   . Esophageal cancer Neg Hx     Social History:  reports that she quit smoking about 13 months ago. Her smoking use included cigarettes. She has a 0.25 pack-year smoking history. She has never used smokeless tobacco. She reports  current alcohol use. She reports that she does not use drugs.  Allergies:  Allergies  Allergen Reactions  . Sulfa Antibiotics Anaphylaxis and Hives  . Sulfur Anaphylaxis  . Other Hives    Mayonnaise     Medications:  I have reviewed the patient's current medications. Prior to Admission:  Medications Prior to Admission  Medication Sig Dispense Refill Last Dose  . acetaminophen (TYLENOL) 500 MG tablet Take 500 mg by mouth every 6 (six) hours as needed for mild pain.    10/14/2019 at Unknown time  . fluticasone (FLONASE) 50 MCG/ACT nasal spray Place 1 spray into both nostrils daily. 16 g 0 Past Week at Unknown time  . omeprazole (PRILOSEC) 20 MG capsule Take 1 capsule (20 mg total) by mouth daily. 30 capsule 0 Past Week at Unknown time  . polyethylene glycol (MIRALAX) 17 g packet Take 17 g by mouth daily. 14 each 0 Past Week at Unknown time  . albuterol (VENTOLIN HFA) 108 (90 Base) MCG/ACT inhaler Inhale 2 puffs into the lungs every 4 (four) hours as needed for wheezing or shortness of breath. 18 g 0 unknown at unknown  . benzonatate (TESSALON) 100 MG capsule Take 1 capsule (100 mg total) by mouth every 8 (eight) hours. (Patient not taking: Reported on 10/09/2019) 21 capsule 0 Not Taking at Unknown time  . cetirizine (ZYRTEC ALLERGY) 10 MG tablet Take 1 tablet (10 mg total) by mouth daily. (Patient not taking: Reported on 10/06/2019) 30 tablet 0 Not Taking at Unknown time   Scheduled: . artificial tears  1 application Both Eyes Z3G  . baricitinib  2 mg Oral Daily  . chlorhexidine gluconate (MEDLINE KIT)  15 mL Mouth Rinse BID  . Chlorhexidine Gluconate Cloth  6 each Topical Daily  . docusate  100 mg Oral BID  . enoxaparin (LOVENOX) injection  65 mg Subcutaneous Q24H  . feeding supplement (PROSource TF)  90 mL Per Tube  TID  . fentaNYL (SUBLIMAZE) injection  50 mcg Intravenous Once  . free water  200 mL Per Tube Q6H  . insulin aspart  0-20 Units Subcutaneous Q4H  . mouth rinse  15 mL  Mouth Rinse 10 times per day  . methylPREDNISolone (SOLU-MEDROL) injection  80 mg Intravenous Q12H  . pantoprazole (PROTONIX) IV  40 mg Intravenous Q24H  . polyethylene glycol  17 g Oral Daily  . sodium chloride flush  10-40 mL Intracatheter Q12H   Continuous: . sodium chloride 10 mL/hr at 11/07/19 1400  . feeding supplement (VITAL 1.5 CAL)    . fentaNYL infusion INTRAVENOUS 275 mcg/hr (11/07/19 1400)  . norepinephrine (LEVOPHED) Adult infusion 7 mcg/min (11/07/19 1400)  . propofol (DIPRIVAN) infusion 45 mcg/kg/min (11/07/19 1450)   AFB:XUXYBFXOVANVB, albuterol, diphenoxylate-atropine, fentaNYL, midazolam, midazolam, ondansetron **OR** ondansetron (ZOFRAN) IV, polyethylene glycol, sodium chloride flush, vecuronium, white petrolatum  Results for orders placed or performed during the hospital encounter of 10/27/2019 (from the past 48 hour(s))  Glucose, capillary     Status: Abnormal   Collection Time: 11/05/19  4:10 PM  Result Value Ref Range   Glucose-Capillary 155 (H) 70 - 99 mg/dL    Comment: Glucose reference range applies only to samples taken after fasting for at least 8 hours.  Glucose, capillary     Status: Abnormal   Collection Time: 11/05/19  7:55 PM  Result Value Ref Range   Glucose-Capillary 166 (H) 70 - 99 mg/dL    Comment: Glucose reference range applies only to samples taken after fasting for at least 8 hours.  Glucose, capillary     Status: Abnormal   Collection Time: 11/06/19 12:02 AM  Result Value Ref Range   Glucose-Capillary 112 (H) 70 - 99 mg/dL    Comment: Glucose reference range applies only to samples taken after fasting for at least 8 hours.  Glucose, capillary     Status: Abnormal   Collection Time: 11/06/19  3:55 AM  Result Value Ref Range   Glucose-Capillary 132 (H) 70 - 99 mg/dL    Comment: Glucose reference range applies only to samples taken after fasting for at least 8 hours.  Comprehensive metabolic panel     Status: Abnormal   Collection Time:  11/06/19  6:51 AM  Result Value Ref Range   Sodium 152 (H) 135 - 145 mmol/L   Potassium 4.9 3.5 - 5.1 mmol/L   Chloride 111 98 - 111 mmol/L   CO2 28 22 - 32 mmol/L   Glucose, Bld 147 (H) 70 - 99 mg/dL    Comment: Glucose reference range applies only to samples taken after fasting for at least 8 hours.   BUN 31 (H) 6 - 20 mg/dL   Creatinine, Ser 1.41 (H) 0.44 - 1.00 mg/dL   Calcium 9.0 8.9 - 10.3 mg/dL   Total Protein 7.0 6.5 - 8.1 g/dL   Albumin 2.0 (L) 3.5 - 5.0 g/dL   AST 81 (H) 15 - 41 U/L   ALT 56 (H) 0 - 44 U/L   Alkaline Phosphatase 177 (H) 38 - 126 U/L   Total Bilirubin 1.1 0.3 - 1.2 mg/dL   GFR calc non Af Amer 49 (L) >60 mL/min   GFR calc Af Amer 57 (L) >60 mL/min   Anion gap 13 5 - 15    Comment: Performed at [facility] [address], [place], [place] [address]  CBC with Differential/Platelet     Status: Abnormal   Collection Time: 11/06/19  6:51  AM  Result Value Ref Range   WBC 15.5 (H) 4.0 - 10.5 K/uL   RBC 5.38 (H) 3.87 - 5.11 MIL/uL   Hemoglobin 15.2 (H) 12.0 - 15.0 g/dL   HCT 46.2 (H) 36 - 46 %   MCV 85.9 80.0 - 100.0 fL   MCH 28.3 26.0 - 34.0 pg   MCHC 32.9 30.0 - 36.0 g/dL   RDW 14.5 11.5 - 15.5 %   Platelets 118 (L) 150 - 400 K/uL    Comment: REPEATED TO VERIFY PLATELET COUNT CONFIRMED BY SMEAR Immature Platelet Fraction may be clinically indicated, consider ordering this additional test [id number]    nRBC 0.0 0.0 - 0.2 %   Neutrophils Relative % 91 %   Neutro Abs 14.0 (H) 1.7 - 7.7 K/uL   Lymphocytes Relative 6 %   Lymphs Abs 0.9 0.7 - 4.0 K/uL   Monocytes Relative 2 %   Monocytes Absolute 0.4 0 - 1 K/uL   Eosinophils Relative 0 %   Eosinophils Absolute 0.0 0 - 0 K/uL   Basophils Relative 0 %   Basophils Absolute 0.0 0 - 0 K/uL   Immature Granulocytes 1 %   Abs Immature Granulocytes 0.19 (H) 0.00 - 0.07 K/uL    Comment: Performed at [facility] [address], [place], [place] [address]  C-reactive protein     Status: Abnormal    Collection Time: 11/06/19  6:51 AM  Result Value Ref Range   CRP 11.3 (H) <1.0 mg/dL    Comment: Performed at [facility] [address], [place], [place] [address]  Glucose, capillary     Status: Abnormal   Collection Time: 11/06/19  8:16 AM  Result Value Ref Range   Glucose-Capillary 150 (H) 70 - 99 mg/dL    Comment: Glucose reference range applies only to samples taken after fasting for at least 8 hours.  Glucose, capillary     Status: Abnormal   Collection Time: 11/06/19 11:40 AM  Result Value Ref Range   Glucose-Capillary 132 (H) 70 - 99 mg/dL    Comment: Glucose reference range applies only to samples taken after fasting for at least 8 hours.  Glucose, capillary     Status: None   Collection Time: 11/06/19  3:30 PM  Result Value Ref Range   Glucose-Capillary 91 70 - 99 mg/dL    Comment: Glucose reference range applies only to samples taken after fasting for at least 8 hours.  Glucose, capillary     Status: Abnormal   Collection Time: 11/06/19  7:44 PM  Result Value Ref Range   Glucose-Capillary 168 (H) 70 - 99 mg/dL    Comment: Glucose reference range applies only to samples taken after fasting for at least 8 hours.  Glucose, capillary     Status: Abnormal   Collection Time: 11/06/19 11:26 PM  Result Value Ref Range   Glucose-Capillary 210 (H) 70 - 99 mg/dL    Comment: Glucose reference range applies only to samples taken after fasting for at least 8 hours.  I-STAT 7, (LYTES, BLD GAS, ICA, H+H)     Status: Abnormal   Collection Time: 11/07/19  2:01 AM  Result Value Ref Range   pH, Arterial 7.277 (L) 7.35 - 7.45   pCO2 arterial 70.2 (HH) 32 - 48 mmHg   pO2, Arterial 64 (L) 83 - 108 mmHg   Bicarbonate 32.8 (H) 20.0 - 28.0 mmol/L   TCO2 35 (H) 22 - 32 mmol/L   O2 Saturation  88.0 %   Acid-Base Excess 4.0 (H) 0.0 - 2.0 mmol/L   Sodium 144 135 - 145 mmol/L   Potassium 5.4 (H) 3.5 - 5.1 mmol/L   Calcium, Ion 1.19 1.15 - 1.40 mmol/L   HCT 43.0 36 - 46 %    Hemoglobin 14.6 12.0 - 15.0 g/dL   Patient temperature 98.6 F    Collection site Radial    Drawn by RT    Sample type ARTERIAL   Comprehensive metabolic panel     Status: Abnormal   Collection Time: 11/07/19  3:06 AM  Result Value Ref Range   Sodium 146 (H) 135 - 145 mmol/L   Potassium 5.8 (H) 3.5 - 5.1 mmol/L   Chloride 107 98 - 111 mmol/L   CO2 30 22 - 32 mmol/L   Glucose, Bld 295 (H) 70 - 99 mg/dL    Comment: Glucose reference range applies only to samples taken after fasting for at least 8 hours.   BUN 38 (H) 6 - 20 mg/dL   Creatinine, Ser 1.74 (H) 0.44 - 1.00 mg/dL   Calcium 8.3 (L) 8.9 - 10.3 mg/dL   Total Protein 6.5 6.5 - 8.1 g/dL   Albumin 2.0 (L) 3.5 - 5.0 g/dL   AST 62 (H) 15 - 41 U/L   ALT 49 (H) 0 - 44 U/L   Alkaline Phosphatase 149 (H) 38 - 126 U/L   Total Bilirubin 0.8 0.3 - 1.2 mg/dL   GFR calc non Af Amer 38 (L) >60 mL/min   GFR calc Af Amer 44 (L) >60 mL/min   Anion gap 9 5 - 15    Comment: Performed at [facility] [address], [place], [place] [address]  C-reactive protein     Status: Abnormal   Collection Time: 11/07/19  3:06 AM  Result Value Ref Range   CRP 7.6 (H) <1.0 mg/dL    Comment: Performed at [facility] [address], [place],  [address]  CBC with Differential/Platelet     Status: Abnormal   Collection Time: 11/07/19  3:06 AM  Result Value Ref Range   WBC 17.7 (H) 4.0 - 10.5 K/uL   RBC 4.95 3.87 - 5.11 MIL/uL   Hemoglobin 14.1 12.0 - 15.0 g/dL   HCT 45.6 36 - 46 %   MCV 92.1 80.0 - 100.0 fL   MCH 28.5 26.0 - 34.0 pg   MCHC 30.9 30.0 - 36.0 g/dL   RDW 14.6 11.5 - 15.5 %   Platelets 92 (L) 150 - 400 K/uL    Comment: REPEATED TO VERIFY PLATELET COUNT CONFIRMED BY SMEAR Immature Platelet Fraction may be clinically indicated, consider ordering this additional test [id number]    nRBC 0.1 0.0 - 0.2 %   Neutrophils Relative % 91 %   Neutro Abs 16.1 (H) 1.7 - 7.7 K/uL   Lymphocytes Relative 6 %   Lymphs Abs 1.0 0.7 -  4.0 K/uL   Monocytes Relative 2 %   Monocytes Absolute 0.3 0 - 1 K/uL   Eosinophils Relative 0 %   Eosinophils Absolute 0.0 0 - 0 K/uL   Basophils Relative 0 %   Basophils Absolute 0.1 0 - 0 K/uL   RBC Morphology MORPHOLOGY UNREMARKABLE    Immature Granulocytes 1 %   Abs Immature Granulocytes 0.23 (H) 0.00 - 0.07 K/uL    Comment: Performed at [facility] [address] [place], [place] [address]  Glucose, capillary     Status: Abnormal  Collection Time: 11/07/19  3:29 AM  Result Value Ref Range   Glucose-Capillary 260 (H) 70 - 99 mg/dL    Comment: Glucose reference range applies only to samples taken after fasting for at least 8 hours.  Triglycerides     Status: Abnormal   Collection Time: 11/07/19  5:45 AM  Result Value Ref Range   Triglycerides 320 (H) <150 mg/dL    Comment: Performed at [facility] [address] [place], [place] [address]  Glucose, capillary     Status: Abnormal   Collection Time: 11/07/19  7:55 AM  Result Value Ref Range   Glucose-Capillary 146 (H) 70 - 99 mg/dL    Comment: Glucose reference range applies only to samples taken after fasting for at least 8 hours.  Glucose, capillary     Status: Abnormal   Collection Time: 11/07/19 11:12 AM  Result Value Ref Range   Glucose-Capillary 131 (H) 70 - 99 mg/dL    Comment: Glucose reference range applies only to samples taken after fasting for at least 8 hours.    DG Chest Port 1 View  Result Date: 11/07/2019 CLINICAL DATA:  COVID positive.  Intubation. EXAM: PORTABLE CHEST 1 VIEW COMPARISON:  11/07/2019 FINDINGS: Endotracheal tube tip in unchanged position approximately 1.2 cm above the lower portion of the carina. Again repositioning of approximately 2-3 cm suggested. NG tube and left IJ line in stable position. Heart size stable. Findings suggesting pneumomediastinum again noted. Low lung volumes bilateral pulmonary infiltrates again noted. No pleural effusion or pneumothorax. Subcutaneous  emphysema noted over the right supraclavicular region. IMPRESSION: 1. Endotracheal tube tip in unchanged position approximately 1.2 cm above the lower portion of the carina. Again repositioning of approximately 2-3 cm suggested. 2.  NG tube and left IJ line stable position. 3. Findings suggesting pneumomediastinum again noted. Right supraclavicular subcutaneous emphysema noted. No pneumothorax noted. 4. Low lung volumes with bilateral pulmonary infiltrates again noted. Electronically Signed   By: Marcello Moores  Register   On: 11/07/2019 07:05   Portable Chest x-ray  Result Date: 11/07/2019 CLINICAL DATA:  Endotracheal tube placement EXAM: PORTABLE CHEST 1 VIEW COMPARISON:  11/04/2019 FINDINGS: Support Apparatus: --Endotracheal tube: Tip 1.2 cm above the inferior margin of the carina. Recommend retraction by 2-3 cm. --Enteric tube:Tip and sideport project over the stomach. --Catheter(s):Left internal jugular vein approach central venous catheter tip is in the proximal right atrium. --Other: None There is lucency surrounding the mediastinum and tracking along the right neck. There is persistent hazy opacity within both lungs. IMPRESSION: 1. Endotracheal tube tip 1.2 cm above the inferior margin of the carina. Recommend retraction by 2-3 cm. 2. Suspected pneumomediastinum.  Consider chest CT for confirmation. 3. Persistent bilateral airspace opacities, likely infection/ARDS. Electronically Signed   By: Ulyses Jarred M.D.   On: 11/07/2019 02:41    Review of Systems- unable to be obtained due to intubated & sedated.  Blood pressure 98/77, pulse (!) 116, temperature 98.6 F (37 C), temperature source Axillary, resp. rate (!) 35, height 5' (1.524 m), weight 129 kg, SpO2 91 %. Physical Exam Vitals reviewed.  Constitutional:      Comments: Intubated, heavily sedated.  Examined in the prone position  HENT:     Head: Normocephalic and atraumatic.  Cardiovascular:     Comments: Tachycardic, regular rhythm Pulmonary:      Comments: Tachypneic, dyssynchronous with the ventilator occasionally breath stacking.  No significant tracheal secretions.  Rales bilaterally. Abdominal:     Comments: Limited due to prone  positioning  Musculoskeletal:     Right lower leg: No edema.     Left lower leg: No edema.  Skin:    General: Skin is warm and dry.     Nails: There is no clubbing.  Neurological:     General: No focal deficit present.     Comments: RASS- 5, no response to verbal stimulation.     Assessment/Plan: Acute hypoxic and hypercapnic vent dependent respiratory failure due to COVID-19 viral pneumonia and ARDS -Evaluated for VV ECMO support.  Does not meet criteria as she can still be optimized medically with NMB in addition to prone positioning.  Additionally not a candidate due to current Covid surge criteria with BMI greater than 40 and worsening AKI due to limited resources. -Continue current management with low tidal volume ventilation, prone positioning for 16 hours/day when P:F< 150.  Net negative fluid balance, remdesivir, steroids, anticoagulation per protocol.  Continue heavy sedation to facilitate vent synchrony.  This patient is critically ill with multiple organ system failure which requires frequent high complexity decision making, assessment, support, evaluation, and titration of therapies. This was completed through the application of advanced monitoring technologies and extensive interpretation of multiple databases. During this encounter critical care time was devoted to patient care services described in this note for 45 minutes.   Julian Hy Date: 11/07/2019 Time: 1:54 PM

## 2019-11-07 NOTE — Progress Notes (Signed)
Dr. Irena Cords notified of potassium results. Orders given see med list. EKG done.

## 2019-11-07 NOTE — Progress Notes (Signed)
Pts head turned to the right and arms rotated. 

## 2019-11-07 NOTE — Progress Notes (Signed)
Assisted tele visit to patient with mother.  Bianca Mira McEachran, RN  

## 2019-11-07 NOTE — Progress Notes (Signed)
Pt turned back supine at 2130 and tolerating well. Rt will draw ABG in one hour.

## 2019-11-07 NOTE — Progress Notes (Signed)
NAME:  ANALEA MULLER, MRN:  [id number], DOB:  1985/03/15, LOS: 8 ADMISSION DATE:  Nov 11, 2019, CONSULTATION DATE:  11-11-19 REFERRING MD:  Hyman Hopes [facility] - ED [facility], CHIEF COMPLAINT:  Dyspnea.    HPI/course in hospital  34 year old woman unvaccinated for COVID. 9 day history of fever, myalgias and increasing dyspnea.   Presented to urgent care but found to be hypoxic with saturations into the 70's with severe distress.   Brought to ED and started on BIPAP. Once in the ED she was de-escalated to HFNC and tolerated well. She was admitted to the hospitalists for tx of COVID-19 related ARDS. On 8/27 she became more fatigued with worsening hypoxia and PCCM was asked to see.   Past Medical History   Past Medical History:  Diagnosis Date  . Abnormal Pap smear    HPV  . Anemia   . Chlamydia   . Eczema   . Gallstones   . Infection    urinary tract infection  . No pertinent past medical history   . Obesity   . Sickle cell trait [facility])      Past Surgical History:  Procedure Laterality Date  . APPENDECTOMY  2010  . BIOPSY  11/13/2017   Procedure: BIOPSY;  Surgeon: Benancio Deeds, MD;  Location: [facility];  Service: Gastroenterology;;  . [name]  . ESOPHAGOGASTRODUODENOSCOPY (EGD) WITH PROPOFOL N/A 11/13/2017   Procedure: ESOPHAGOGASTRODUODENOSCOPY (EGD) WITH PROPOFOL;  Surgeon: Benancio Deeds, MD;  Location: [facility];  Service: Gastroenterology;  Laterality: N/A;     Significant Hospital Events   Intubated 9/2>>  Consults:  PCCM  Procedures:  Intubation 9/3>> Right Radial A-line 9/3>> Left IJ CVL 9/3>>  Significant Diagnostic Tests:  CXR 9/3: ET tube 1.2cm above carina. Bilateral opacities. Subcutaneous emphysema noted over right supraclavicular region.   Micro Data:  Blood 8/26> Negative Resp Culture 9/3>>  Antimicrobials:    Interim history/subjective:  Patient was intubated overnight. Right radial a-line placed this morning.  Objective     Blood pressure 104/70, pulse (!) 112, temperature 98.6 F (37 C), temperature source Axillary, resp. rate (!) 31, height 5' (1.524 m), weight 129 kg, SpO2 (!) 87 %.    Vent Mode: PRVC FiO2 (%):  [90 %-100 %] 100 % Set Rate:  [10 bmp-35 bmp] 35 bmp Vt Set:  [380 mL] 380 mL PEEP:  [8 cmH20-18 cmH20] 12 cmH20 Plateau Pressure:  [26 cmH20-35 cmH20] 26 cmH20   Intake/Output Summary (Last 24 hours) at 11/07/2019 1125 Last data filed at 11/07/2019 2542 Gross per 24 hour  Intake 3250.16 ml  Output 2175 ml  Net 1075.16 ml   Filed Weights   November 11, 2019 1517  Weight: 129 kg    Examination: General: obese adult female, proned HEENT: ET tube in place, sclera nonicteric Neuro: Sedated CV: s1s2 regular rate and rhythm, no murmur, rubs, or gallops,  PULM: diminished breath sounds. No wheezes rales or rhonchi. GI: unable to assess in prone position Extremities: warm/dry, no edema  Skin: no rashes or lesions  Ancillary tests (personally reviewed)  CBC: Recent Labs  Lab 11/02/19 0750 11/02/19 0750 11/03/19 0233 11/04/19 0504 11/05/19 0555 11/06/19 0651 11/07/19 0201  WBC 9.4  --  11.2* 13.1* 13.5* 15.5*  --   NEUTROABS 7.8*  --  9.4* 12.3* 12.4* 14.0*  --   HGB 13.0   < > 14.7 14.4 15.3* 15.2* 14.6  HCT 40.1   < > 44.5 43.5 46.1* 46.2* 43.0  MCV 84.4  --  83.6 84.5 84.9 85.9  --   PLT 199  --  214 166 132* 118*  --    < > = values in this interval not displayed.    Basic Metabolic Panel: Recent Labs  Lab 11/02/19 0750 11/02/19 0750 11/03/19 0233 11/03/19 0233 11/04/19 0504 11/05/19 0555 11/06/19 0651 11/07/19 0201 11/07/19 0306  NA 139   < > 144   < > 143 149* 152* 144 146*  K 3.7   < > 4.1   < > 4.1 4.7 4.9 5.4* 5.8*  CL 103   < > 106  --  106 109 111  --  107  CO2 25   < > 26  --  26 28 28   --  30  GLUCOSE 193*   < > 169*  --  150* 170* 147*  --  295*  BUN 54*   < > 50*  --  38* 32* 31*  --  38*  CREATININE 1.67*   < > 1.57*  --  1.28* 1.33* 1.41*  --  1.74*  CALCIUM  8.0*   < > 8.4*  --  8.2* 8.7* 9.0  --  8.3*  MG 3.6*  --   --   --   --   --   --   --   --    < > = values in this interval not displayed.   GFR: Estimated Creatinine Clearance: 57.8 mL/min (A) (by C-G formula based on SCr of 1.74 mg/dL (H)). Recent Labs  Lab 11/03/19 0233 11/04/19 0504 11/05/19 0555 11/06/19 0651  WBC 11.2* 13.1* 13.5* 15.5*    Liver Function Tests: Recent Labs  Lab 11/03/19 0233 11/04/19 0504 11/05/19 0555 11/06/19 0651 11/07/19 0306  AST 150* 111* 93* 81* 62*  ALT 90* 76* 71* 56* 49*  ALKPHOS 86 132* 156* 177* 149*  BILITOT 0.7 0.9 0.8 1.1 0.8  PROT 6.3* 6.4* 7.1 7.0 6.5  ALBUMIN 2.3* 2.1* 2.1* 2.0* 2.0*   No results for input(s): LIPASE, AMYLASE in the last 168 hours. No results for input(s): AMMONIA in the last 168 hours.  ABG    Component Value Date/Time   PHART 7.277 (L) 11/07/2019 0201   PCO2ART 70.2 (HH) 11/07/2019 0201   PO2ART 64 (L) 11/07/2019 0201   HCO3 32.8 (H) 11/07/2019 0201   TCO2 35 (H) 11/07/2019 0201   O2SAT 88.0 11/07/2019 0201     Coagulation Profile: No results for input(s): INR, PROTIME in the last 168 hours.  Cardiac Enzymes: No results for input(s): CKTOTAL, CKMB, CKMBINDEX, TROPONINI in the last 168 hours.  HbA1C: Hgb A1c MFr Bld  Date/Time Value Ref Range Status  11/02/2019 07:50 AM 6.9 (H) 4.8 - 5.6 % Final    Comment:    (NOTE) Pre diabetes:          5.7%-6.4%  Diabetes:              >6.4%  Glycemic control for   <7.0% adults with diabetes     CBG: Recent Labs  Lab 11/06/19 1944 11/06/19 2326 11/07/19 0329 11/07/19 0755 11/07/19 1112  GLUCAP 168* 210* 260* 146* 131*     Assessment & Plan:   Acute Hypoxic / Hypercapnic Respiratory Failure in setting of ARDS due to COVID 19 Pneumonia - ARDS vent protocol with 6-8cc/kg low tidal volume, prone if P/F ratio is <150 - Maintain driving pressure 01/07/20 and plateau pressure <7mmHg - Will work on decreasing PEEP today, it was 18 this morning -  Continue remdesivir, baricitnb, and IV steroids for COVID therapy - S/P CAP coverage end on 8/31 - Check respiratory culture and procalcitonin today for superimposed bacterial pneumoni  Acute Kidney Injury -in the setting of Covid pneumonia/worsening hypoxemia. creatinine on admission 1.80, trending back up today to 1.7 from 1.4 yesterday  - Follow renal function / urine output - Avoid nephrotoxins, ensure adequate renal perfusion  - Strict intake and output  Subcutaneous Emphysema, Concern for Pneumomediastinum In setting of COVID.  - Will continue to monitor via dialy CXR - Working on reducing PEEP   Hypernatremia - In setting of increased free water loss from infection and poor oral intake due to high oxygen requirements - Received D5W yesterday with improvement in Sodium level - Start free water flushes via OG, q4hrs  Transaminitis, improving Trend LFTs  Diarrhea: Likely related to COVID. Treat with lomotil for now, no suspicion for Cdiff, will monitor.   Elevated Triglycerides Elevated in setting of covid Will monitor in setting of propofol sedation, will transitioning to other sedative if needed  Best practice  :  Diet: TF per nutrition Pain/Anxiety/Delirium protocol (if indicated): As needed VAP protocol (if indicated): In place DVT prophylaxis: Lovenox GI prophylaxis: PPI Glucose control: SSI Mobility: Bedrest  Code Status: Full Family Communication: Will call family today to give update Disposition: ICU  Critical care time:    Performed by: Martina Sinner  Total critical care time: 38 minutes  Critical care time was exclusive of separately billable procedures and treating other patients.  Critical care was necessary to treat or prevent imminent or life-threatening deterioration.  Critical care was time spent personally by me on the following activities: development of treatment plan with patient and/or surrogate as well as nursing, discussions with  consultants, evaluation of patient's response to treatment, examination of patient, obtaining history from patient or surrogate, ordering and performing treatments and interventions, ordering and review of laboratory studies, ordering and review of radiographic studies, pulse oximetry and re-evaluation of patient's condition.  Melody Comas, MD [facility] Office: [phone]   See Amion for Pager Details

## 2019-11-07 NOTE — Progress Notes (Addendum)
Initial Nutrition Assessment  DOCUMENTATION CODES:   Morbid obesity  INTERVENTION:   Initiate tube feeding via OG tube: Vital 1.5 at 40 ml/h (960 ml per day) Prosource TF 90 ml TID Free water flushes 250 ml every 4 hours  Provides 1680 kcal (2498 kcal total with propofol), 131 gm protein, 733 ml free water daily.  With free water flushes 200 ml every 6 hours, total free water provision is 1533 ml per day. Recommend increasing to 300 ml every 4 hours for total of 2,533 ml free water per day. May need to adjust free water flushes further pending sodium levels.   Per ASPEN guidelines, recommend continue gastric feedings during prone positioning, keeping HOB elevated (reverse trendelenburg) to at least 10-25 degrees.    NUTRITION DIAGNOSIS:   Increased nutrient needs related to catabolic illness (COVID-19, ARDS) as evidenced by estimated needs.  GOAL:   Patient will meet greater than or equal to 90% of their needs  MONITOR:   Vent status, Labs, TF tolerance  REASON FOR ASSESSMENT:   Ventilator, Consult Enteral/tube feeding initiation and management  ASSESSMENT:   34 yo female admitted with COVID-19, ARDS. Required intubation 9/3. PMH includes sickle cell trait, gallstones, anemia, eczema.   Received MD Consult for TF initiation and management. OG tube in place.  Patient was intubated over night and proned this morning at 5am.  Patient is currently intubated on ventilator support MV: 12.9 L/min Temp (24hrs), Avg:99.6 F (37.6 C), Min:98.4 F (36.9 C), Max:101.5 F (38.6 C)  Propofol: 31 ml/hr providing 818 kcal from lipid  Labs reviewed. Na 146, potassium 5.8, BUN 38, creat 1.74 CBG: 260-146  Medications reviewed and include colace, novolog, solu-medrol, miralax, fentanyl, levophed, propofol.  Usual weights reviewed. No significant weight changes noted. No new weight measurement available since admission.   I/O -2.4 L since admission UOP 2,500 ml x 24  hours  NUTRITION - FOCUSED PHYSICAL EXAM:  unable to complete  Diet Order:   Diet Order            Diet regular Room service appropriate? Yes; Fluid consistency: Thin  Diet effective now                 EDUCATION NEEDS:   Not appropriate for education at this time  Skin:  Skin Assessment: Reviewed RN Assessment  Last BM:  9/3  Height:   Ht Readings from Last 1 Encounters:  11/06/19 5' (1.524 m)    Weight:   Wt Readings from Last 1 Encounters:  10/13/2019 129 kg    Ideal Body Weight:  45.5 kg    BMI:  Body mass index is 55.54 kg/m.  Estimated Nutritional Needs:   Kcal:  2229-7989  Protein:  125-145 gm  Fluid:  >/= 2 L    Gabriel Rainwater, RD, LDN, CNSC Please refer to Amion for contact information.

## 2019-11-07 NOTE — Progress Notes (Signed)
Pts head turned to the right and arms rotated.

## 2019-11-07 NOTE — Progress Notes (Signed)
Pts head turned to the left and arms rotated.

## 2019-11-08 ENCOUNTER — Inpatient Hospital Stay (HOSPITAL_COMMUNITY): Payer: BC Managed Care – PPO

## 2019-11-08 LAB — CBC WITH DIFFERENTIAL/PLATELET
Abs Immature Granulocytes: 0.23 10*3/uL — ABNORMAL HIGH (ref 0.00–0.07)
Basophils Absolute: 0.1 10*3/uL (ref 0.0–0.1)
Basophils Relative: 0 %
Eosinophils Absolute: 0 10*3/uL (ref 0.0–0.5)
Eosinophils Relative: 0 %
HCT: 45.6 % (ref 36.0–46.0)
Hemoglobin: 14.1 g/dL (ref 12.0–15.0)
Immature Granulocytes: 1 %
Lymphocytes Relative: 6 %
Lymphs Abs: 1 10*3/uL (ref 0.7–4.0)
MCH: 28.5 pg (ref 26.0–34.0)
MCHC: 30.9 g/dL (ref 30.0–36.0)
MCV: 92.1 fL (ref 80.0–100.0)
Monocytes Absolute: 0.3 10*3/uL (ref 0.1–1.0)
Monocytes Relative: 2 %
Neutro Abs: 16.1 10*3/uL — ABNORMAL HIGH (ref 1.7–7.7)
Neutrophils Relative %: 91 %
Platelets: 92 10*3/uL — ABNORMAL LOW (ref 150–400)
RBC: 4.95 MIL/uL (ref 3.87–5.11)
RDW: 14.6 % (ref 11.5–15.5)
WBC: 17.7 10*3/uL — ABNORMAL HIGH (ref 4.0–10.5)
nRBC: 0.1 % (ref 0.0–0.2)

## 2019-11-08 LAB — COMPREHENSIVE METABOLIC PANEL
ALT: 69 U/L — ABNORMAL HIGH (ref 0–44)
AST: 109 U/L — ABNORMAL HIGH (ref 15–41)
Albumin: 2 g/dL — ABNORMAL LOW (ref 3.5–5.0)
Alkaline Phosphatase: 95 U/L (ref 38–126)
Anion gap: 15 (ref 5–15)
BUN: 78 mg/dL — ABNORMAL HIGH (ref 6–20)
CO2: 23 mmol/L (ref 22–32)
Calcium: 7.8 mg/dL — ABNORMAL LOW (ref 8.9–10.3)
Chloride: 103 mmol/L (ref 98–111)
Creatinine, Ser: 4.06 mg/dL — ABNORMAL HIGH (ref 0.44–1.00)
GFR calc Af Amer: 16 mL/min — ABNORMAL LOW (ref >60)
GFR calc non Af Amer: 14 mL/min — ABNORMAL LOW (ref >60)
Glucose, Bld: 243 mg/dL — ABNORMAL HIGH (ref 70–99)
Potassium: 5.8 mmol/L — ABNORMAL HIGH (ref 3.5–5.1)
Sodium: 141 mmol/L (ref 135–145)
Total Bilirubin: 1.1 mg/dL (ref 0.3–1.2)
Total Protein: 6.1 g/dL — ABNORMAL LOW (ref 6.5–8.1)

## 2019-11-08 LAB — POCT I-STAT 7, (LYTES, BLD GAS, ICA,H+H)
Acid-Base Excess: 9 mmol/L — ABNORMAL HIGH (ref 0.0–2.0)
Bicarbonate: 38.3 mmol/L — ABNORMAL HIGH (ref 20.0–28.0)
Calcium, Ion: 1 mmol/L — ABNORMAL LOW (ref 1.15–1.40)
HCT: 26 % — ABNORMAL LOW (ref 36.0–46.0)
Hemoglobin: 8.8 g/dL — ABNORMAL LOW (ref 12.0–15.0)
O2 Saturation: 69 %
Patient temperature: 101
Potassium: 6.9 mmol/L (ref 3.5–5.1)
Sodium: 151 mmol/L — ABNORMAL HIGH (ref 135–145)
TCO2: 41 mmol/L — ABNORMAL HIGH (ref 22–32)
pCO2 arterial: 96.5 mmHg (ref 32.0–48.0)
pH, Arterial: 7.214 — ABNORMAL LOW (ref 7.350–7.450)
pO2, Arterial: 49 mmHg — ABNORMAL LOW (ref 83.0–108.0)

## 2019-11-08 LAB — GLUCOSE, CAPILLARY: Glucose-Capillary: 221 mg/dL — ABNORMAL HIGH (ref 70–99)

## 2019-11-08 MED ORDER — BARICITINIB 1 MG PO TABS
1.0000 mg | ORAL_TABLET | Freq: Every day | ORAL | Status: DC
  Filled 2019-11-08: qty 1

## 2019-11-08 MED ORDER — EPINEPHRINE HCL 5 MG/250ML IV SOLN IN NS
2.0000 ug/min | INTRAVENOUS | Status: DC
  Filled 2019-11-08: qty 250

## 2019-11-08 MED ORDER — TENECTEPLASE 50 MG IV KIT
50.0000 mg | PACK | INTRAVENOUS | Status: AC
  Administered 2019-11-08: 50 mg via INTRAVENOUS
  Filled 2019-11-08: qty 10

## 2019-11-08 MED ORDER — VANCOMYCIN HCL 10 G IV SOLR
2500.0000 mg | INTRAVENOUS | Status: DC
  Filled 2019-11-08: qty 2500

## 2019-11-08 MED ORDER — VASOPRESSIN 20 UNITS/100 ML INFUSION FOR SHOCK
0.0000 [IU]/min | INTRAVENOUS | Status: DC
  Administered 2019-11-08: 0.03 [IU]/min via INTRAVENOUS
  Filled 2019-11-08: qty 100

## 2019-11-08 MED ORDER — NOREPINEPHRINE 16 MG/250ML-% IV SOLN
60.0000 ug/min | INTRAVENOUS | Status: DC
  Administered 2019-11-08: 70 ug/min via INTRAVENOUS
  Filled 2019-11-08: qty 500

## 2019-11-08 MED ORDER — SODIUM CHLORIDE 0.9 % IV SOLN: 2.0000 g | Freq: Every day | INTRAVENOUS | Status: DC

## 2019-11-08 MED ORDER — EPINEPHRINE HCL 5 MG/250ML IV SOLN IN NS
INTRAVENOUS | Status: AC
  Administered 2019-11-08: 8 ug/min via INTRAVENOUS
  Filled 2019-11-08: qty 250

## 2019-11-08 MED ORDER — SODIUM CHLORIDE 0.9 % IV SOLN: INTRAVENOUS | Status: DC

## 2019-11-08 MED FILL — Medication: Qty: 1 | Status: AC

## 2019-11-09 LAB — CULTURE, RESPIRATORY W GRAM STAIN
Culture: NORMAL
Gram Stain: NONE SEEN

## 2019-11-12 LAB — CULTURE, BLOOD (ROUTINE X 2)
Culture: NO GROWTH
Culture: NO GROWTH

## 2019-12-05 NOTE — Progress Notes (Signed)
eLink Physician-Brief Progress Note Patient Name: Bianca Cantrell DOB: [birth year] MRN: [id number]   Date of Service  11-22-2019  HPI/Events of Note  Nursing concern for HR = 144 Sinus tachycardia and Sat = 74%. CXR from 10:15 PM = unchanged possible trace pneumothorax and persistent bilateral airspace disease. Fever to 102.5 F.  eICU Interventions  Plan: 1. Treat fever. 2. Reprone patient.  3. Repeat portable CXR STAt.      Intervention Category Major Interventions: Arrhythmia - evaluation and management;Hypoxemia - evaluation and management  Alfreddie Consalvo Dennard Nip 11/22/19, 1:10 AM

## 2019-12-05 NOTE — Progress Notes (Signed)
Providers at bedside.

## 2019-12-05 NOTE — Progress Notes (Signed)
Rhythm change noticed 0200- MD notified. EKG performed, STEMI on EKG. See MAR. MD/PA remained at bedside. Drip titrations dictated by providers until withdrawal of care. All belongings given to husband/mother. See code documentation.

## 2019-12-05 NOTE — Progress Notes (Signed)
Pharmacy Antibiotic Note  Bianca Cantrell is a 34 y.o. female admitted on 10/13/2019 with COVID - now with pneumonia.  Pharmacy has been consulted for Vancomycin and Cefepime dosing.  Noted that pt SCr rising rapidly - now up to 4.06, est norm CrCl ~20 ml/min. UOP 765/24h  Plan: Decrease baracitinib to 1mg  daily (renal dose adjustment) Cefepime 2gm IV q24h Vancomycin 2000 mg IV now Will f/u renal function for further Vanc dosing, micro data, and pt's clinical condition Vanc levels prn   Height: 5' (152.4 cm) Weight: 129 kg (284 lb 6.3 oz) IBW/kg (Calculated) : 45.5  Temp (24hrs), Avg:101.1 F (38.4 C), Min:98.6 F (37 C), Max:102.5 F (39.2 C)  Recent Labs  Lab 11/03/19 0233 11/03/19 0233 11/04/19 0504 11/04/19 0504 11/05/19 0555 11/06/19 0651 11/07/19 0306 11/07/19 1648 Dec 04, 2019 0003  WBC 11.2*  --  13.1*  --  13.5* 15.5* 17.7*  --   --   CREATININE 1.57*   < > 1.28*   < > 1.33* 1.41* 1.74* 3.25* 4.06*   < > = values in this interval not displayed.    Estimated Creatinine Clearance: 24.8 mL/min (A) (by C-G formula based on SCr of 4.06 mg/dL (H)).    Allergies  Allergen Reactions  . Sulfa Antibiotics Anaphylaxis and Hives  . Sulfur Anaphylaxis  . Other Hives    Mayonnaise     Antimicrobials this admission: 9/4 Cefepime >>  9/4 Vanc >>   Microbiology results: 9/3 BCx:  9/3 Trach asp:   8/26 BCx: negative  Thank you for allowing pharmacy to be a part of this patient's care.  9/26, PharmD, BCPS Please see amion for complete clinical pharmacist phone list 2019/12/04 1:36 AM

## 2019-12-05 NOTE — Death Summary Note (Signed)
NAME:  Bianca Cantrell, MRN:  [id number], DOB:  1985-12-19, LOS: 8 ADMISSION DATE:  11/12/19, CONSULTATION DATE:  11-12-19 REFERRING MD:  Hyman Hopes [facility] - ED [facility], CHIEF COMPLAINT:  Dyspnea.    HPI/course in hospital  34 year old woman unvaccinated for COVID. 9 day history of fever, myalgias and increasing dyspnea.   Presented to urgent care but found to be hypoxic with saturations into the 70's with severe distress.   Brought to ED and started on BIPAP. Once in the ED she was de-escalated to HFNC and tolerated well. She was admitted to the hospitalists for tx of COVID-19 related ARDS. On 8/27 she became more fatigued with worsening hypoxia and PCCM was asked to see.   Her clinical and respiratory status continued to worsen and she was intubated on 9/3.   In the early morning hours of 9/4 patient began to rapidly decline with oxygen saturations 70%'s, rapidly escalating pressor requirements and renal failure.  Repeat CXR's with continued large pneumomediastinum and small pneumothorax.  POCUS showed enlarged RV, EKG with anterior ST elevations. Spoke with  Cardiology on call who stated that patient was not a candidate for any kind of intervention given hemodynamic instability and agreed with lytics. Pt was given TPA however continued to rapidly decline.  A code blue was called and ROSC obtained, however continued to become increasingly hypotensive despite maximal Levophed, Vasopressin and Epinephrin.  Multiple amps bicarb, calcium and epi given.  Thoracic needle decompression also attempted. Pt's mother at the bedside when patient passed  Past Medical History       Past Medical History:  Diagnosis Date  . Abnormal Pap smear    HPV  . Anemia   . Chlamydia   . Eczema   . Gallstones   . Infection    urinary tract infection  . No pertinent past medical history   . Obesity   . Sickle cell trait [facility])           Past Surgical History:  Procedure Laterality Date  . APPENDECTOMY   2010  . BIOPSY  11/13/2017   Procedure: BIOPSY;  Surgeon: Benancio Deeds, MD;  Location: [facility];  Service: Gastroenterology;;  . [name]  . ESOPHAGOGASTRODUODENOSCOPY (EGD) WITH PROPOFOL N/A 11/13/2017   Procedure: ESOPHAGOGASTRODUODENOSCOPY (EGD) WITH PROPOFOL;  Surgeon: Benancio Deeds, MD;  [facility];  Service: Gastroenterology;  Laterality: N/A;     Significant Hospital Events   Intubated 9/2>> Proned 9/3>> Code blue 9/4  Consults:  PCCM  Procedures:  Intubation 9/3>> Right Radial A-line 9/3>> Left IJ CVL 9/3>>  Significant Diagnostic Tests:  CXR 9/3: ET tube 1.2cm above carina. Bilateral opacities. Subcutaneous emphysema noted over right supraclavicular region.   CXR 9/4>. Retraction of endotracheal tube with tip now at the level of the  clavicular heads.  2. Unchanged appearance of pneumomediastinum and bilateral airspace opacities.  Micro Data:  Blood 8/26> Negative Resp Culture 9/3>>  Antimicrobials:  Cefepime 9/4 Vancomycin 9/4  Interim history/subjective:  Despite maximal pressors, lytics, bicarb, vent support, calcium, broad spectrum antibiotics and ACLS pt passed with mother at the bedside  Objective   Blood pressure 104/70, pulse (!) 112, temperature 98.6 F (37 C), temperature source Axillary, resp. rate (!) 31, height 5' (1.524 m), weight 129 kg, SpO2 (!) 87 %.     >  Vent Mode: PRVC FiO2 (%):  [90 %-100 %] 100 % Set Rate:  [10 bmp-35 bmp] 35 bmp Vt Set:  [380 mL] 380  mL PEEP:  [8 cmH20-18 cmH20] 12 cmH20 Plateau Pressure:  [26 cmH20-35 cmH20] 26 cmH20     Intake/Output Summary (Last 24 hours) at 11/07/2019 1125 Last data filed at 11/07/2019 0819    Gross per 24 hour  Intake 3250.16 ml  Output 2175 ml  Net 1075.16 ml      Filed Weights   10/31/2019 1517  Weight: 129 kg    General:  Obese F, intubated and sedated HEENT: MM pink/moist, ETT in place Neuro: sedated and  unresponsive CV: s1s2 tachycardic regular, no m/r/g PULM:  On full vent support with diminished air movement bilaterally GI: soft, bsx4 active  Extremities: warm/dry, no edema  Skin: no rashes or lesions   Ancillary tests (personally reviewed)  CBC: Last Labs            Recent Labs  Lab 11/02/19 0750 11/02/19 0750 11/03/19 0233 11/04/19 0504 11/05/19 0555 11/06/19 0651 11/07/19 0201  WBC 9.4  --  11.2* 13.1* 13.5* 15.5*  --   NEUTROABS 7.8*  --  9.4* 12.3* 12.4* 14.0*  --   HGB 13.0   < > 14.7 14.4 15.3* 15.2* 14.6  HCT 40.1   < > 44.5 43.5 46.1* 46.2* 43.0  MCV 84.4  --  83.6 84.5 84.9 85.9  --   PLT 199  --  214 166 132* 118*  --    < > = values in this interval not displayed.      Basic Metabolic Panel: Last Labs              Recent Labs  Lab 11/02/19 0750 11/02/19 0750 11/03/19 0233 11/03/19 0233 11/04/19 0504 11/05/19 0555 11/06/19 0651 11/07/19 0201 11/07/19 0306  NA 139   < > 144   < > 143 149* 152* 144 146*  K 3.7   < > 4.1   < > 4.1 4.7 4.9 5.4* 5.8*  CL 103   < > 106  --  106 109 111  --  107  CO2 25   < > 26  --  26 28 28   --  30  GLUCOSE 193*   < > 169*  --  150* 170* 147*  --  295*  BUN 54*   < > 50*  --  38* 32* 31*  --  38*  CREATININE 1.67*   < > 1.57*  --  1.28* 1.33* 1.41*  --  1.74*  CALCIUM 8.0*   < > 8.4*  --  8.2* 8.7* 9.0  --  8.3*  MG 3.6*  --   --   --   --   --   --   --   --    < > = values in this interval not displayed.     GFR: Estimated Creatinine Clearance: 57.8 mL/min (A) (by C-G formula based on SCr of 1.74 mg/dL (H)). Last Labs         Recent Labs  Lab 11/03/19 0233 11/04/19 0504 11/05/19 0555 11/06/19 0651  WBC 11.2* 13.1* 13.5* 15.5*      Liver Function Tests: Last Labs          Recent Labs  Lab 11/03/19 0233 11/04/19 0504 11/05/19 0555 11/06/19 0651 11/07/19 0306  AST 150* 111* 93* 81* 62*  ALT 90* 76* 71* 56* 49*  ALKPHOS 86 132* 156* 177* 149*  BILITOT 0.7 0.9 0.8 1.1 0.8  PROT 6.3* 6.4*  7.1 7.0 6.5  ALBUMIN 2.3* 2.1* 2.1* 2.0* 2.0*  Last Labs   No results for input(s): LIPASE, AMYLASE in the last 168 hours.   Last Labs   No results for input(s): AMMONIA in the last 168 hours.    ABG Labs (Brief)          Component Value Date/Time   PHART 7.277 (L) 11/07/2019 0201   PCO2ART 70.2 (HH) 11/07/2019 0201   PO2ART 64 (L) 11/07/2019 0201   HCO3 32.8 (H) 11/07/2019 0201   TCO2 35 (H) 11/07/2019 0201   O2SAT 88.0 11/07/2019 0201       Coagulation Profile: Last Labs   No results for input(s): INR, PROTIME in the last 168 hours.    Cardiac Enzymes: Last Labs   No results for input(s): CKTOTAL, CKMB, CKMBINDEX, TROPONINI in the last 168 hours.    HbA1C: Last Labs         Hgb A1c MFr Bld  Date/Time Value Ref Range Status  11/02/2019 07:50 AM 6.9 (H) 4.8 - 5.6 % Final    Comment:    (NOTE) Pre diabetes:          5.7%-6.4%  Diabetes:              >6.4%  Glycemic control for   <7.0% adults with diabetes       CBG: Last Labs          Recent Labs  Lab 11/06/19 1944 11/06/19 2326 11/07/19 0329 11/07/19 0755 11/07/19 1112  GLUCAP 168* 210* 260* 146* 131*       Assessment & Plan:   Acute Hypoxic / Hypercapnic Respiratory Failure in setting of ARDS due to COVID 19 Pneumonia -Pt progressed from HFNC to Bipap and eventually to intubation -She was ventilated with ARDS protocol and proning, with driving pressure <06 -treated with Remdesevir, Baricitinib, steroids and CAP coverage -despite maximal therapy she developed worsening hypoxia, hypercarbia and hypotension with possible STEMI vs PE, lytics given see significant event note  Acute Kidney Injury -creatinine rapidly worsening, hyperkalemic treated with bicarb and calcium, in code situation to unstable to consider CRRT  Pneumomediastinum  Stable on multiple CXR without large pneumothorax, needle decompression done bilaterally during code as last resort without  improvement    CRITICAL CARE  Performed by: Darcella Gasman Elzora Cullins    Total critical care time: 70 minutes   Critical care time was exclusive of separately billable procedures and treating other  patients.   Critical care was necessary to treat or prevent imminent or life-threatening deterioration.   Critical care was time spent personally by me on the following activities: development of  treatment plan with patient and/or surrogate as well as nursing, discussions with  consultants, evaluation of patient's response to treatment, examination of patient,  obtaining history from patient or surrogate, ordering and performing treatments and  interventions, ordering and review of laboratory studies, ordering and review of  radiographic studies, pulse oximetry and re-evaluation of patient's condition.   Darcella Gasman Nikita Surman, PA-C

## 2019-12-05 NOTE — Progress Notes (Signed)
   11-22-19 0500  Clinical Encounter Type  Visited With Family;Patient  Visit Type Code  Referral From Nurse  Consult/Referral To Chaplain  Spiritual Encounters  Spiritual Needs Emotional;Prayer;Grief support  Chaplain responded to page, Nurse informed Pt is at end of life and wanted support for the family who was on the way.  Met Pt. mother and escorted her to the meeting room for comfort and had the MD to come and talk with the mother. Comforted and gave grief support to Pt. Mother.

## 2019-12-05 NOTE — Progress Notes (Signed)
All epinephrine and Levophed titrations made from verbal orders from providers at bedside (following Vasopressin initiation): Vernona Rieger Gleason, PA and Felisa Bonier, MD.  See code sheet for code documentation.

## 2019-12-05 NOTE — Progress Notes (Signed)
Mechanical ventilation turned off and PT extubated post end of life.

## 2019-12-05 NOTE — Progress Notes (Signed)
Providers at bedside- Vernona Rieger Gleason PA, verbal order given to increase Levophed as needed to maintain MAP 65 or greater. Ok to titrate by 4 mcg.

## 2019-12-05 NOTE — Significant Event (Signed)
Asked to evaluate at bedside due to rapidly rising pressor requirement and worsening hypoxia. CXR showed ETT in appropriate position, stable pneumomediastinum and alveolar opacities. Bedside US showed remarkably distended RV, underfilled LV, plethoric IVC without distensibility on 14 of PEEP. Despite no change in minute ventilation or temperature, her PaCO2 had risen as well, suggesting worsening dead space physiology. This all raised concern for massive PE and we ordered systemic tPA. As we were awaiting tPA, there were suddenly concerning ST changes on the monitor and 12-lead EKG showed anterior STEMI. We discussed case with cardiology and as she was not candidate for any intervention due to overall instability with very high pressor and mechanical ventilation requirements, we agreed that systemic tPA would be reasonable intervention for both processes. We called the patient's mother, Bianca Cantrell, to discuss her deteriorating condition as well as the risks and benefits of using tPA in this setting, including the possibility of catastrophic bleeding. After tPA was administered she developed a wide complex tachycardia and further worsening of her shock. As we brought the defibrillator to bedside she then went into PEA arrest and ROSC was obtained after two round of ACLS although her pressor and vent requirements remained extraordinarly high. We again reached out to the patient's mother who requested that we continue all life sustaining measures possible to allow her a chance to make it to the hospital to see her. After Bianca Cantrell arrived we spoke about how this event would not be survivable and that we would not recommend CPR and as we discussed this the patient went into asystolic arrest and she was pronounced dead at 3:48.

## 2019-12-05 DEATH — deceased

## 2020-09-15 IMAGING — DX DG CHEST 1V PORT
1 series · 1 of 1 positions shown · non-contrast
Comparison: 10/27/2019

CLINICAL DATA: COVID, shortness of breath

EXAM:
PORTABLE CHEST 1 VIEW

[chest ap]
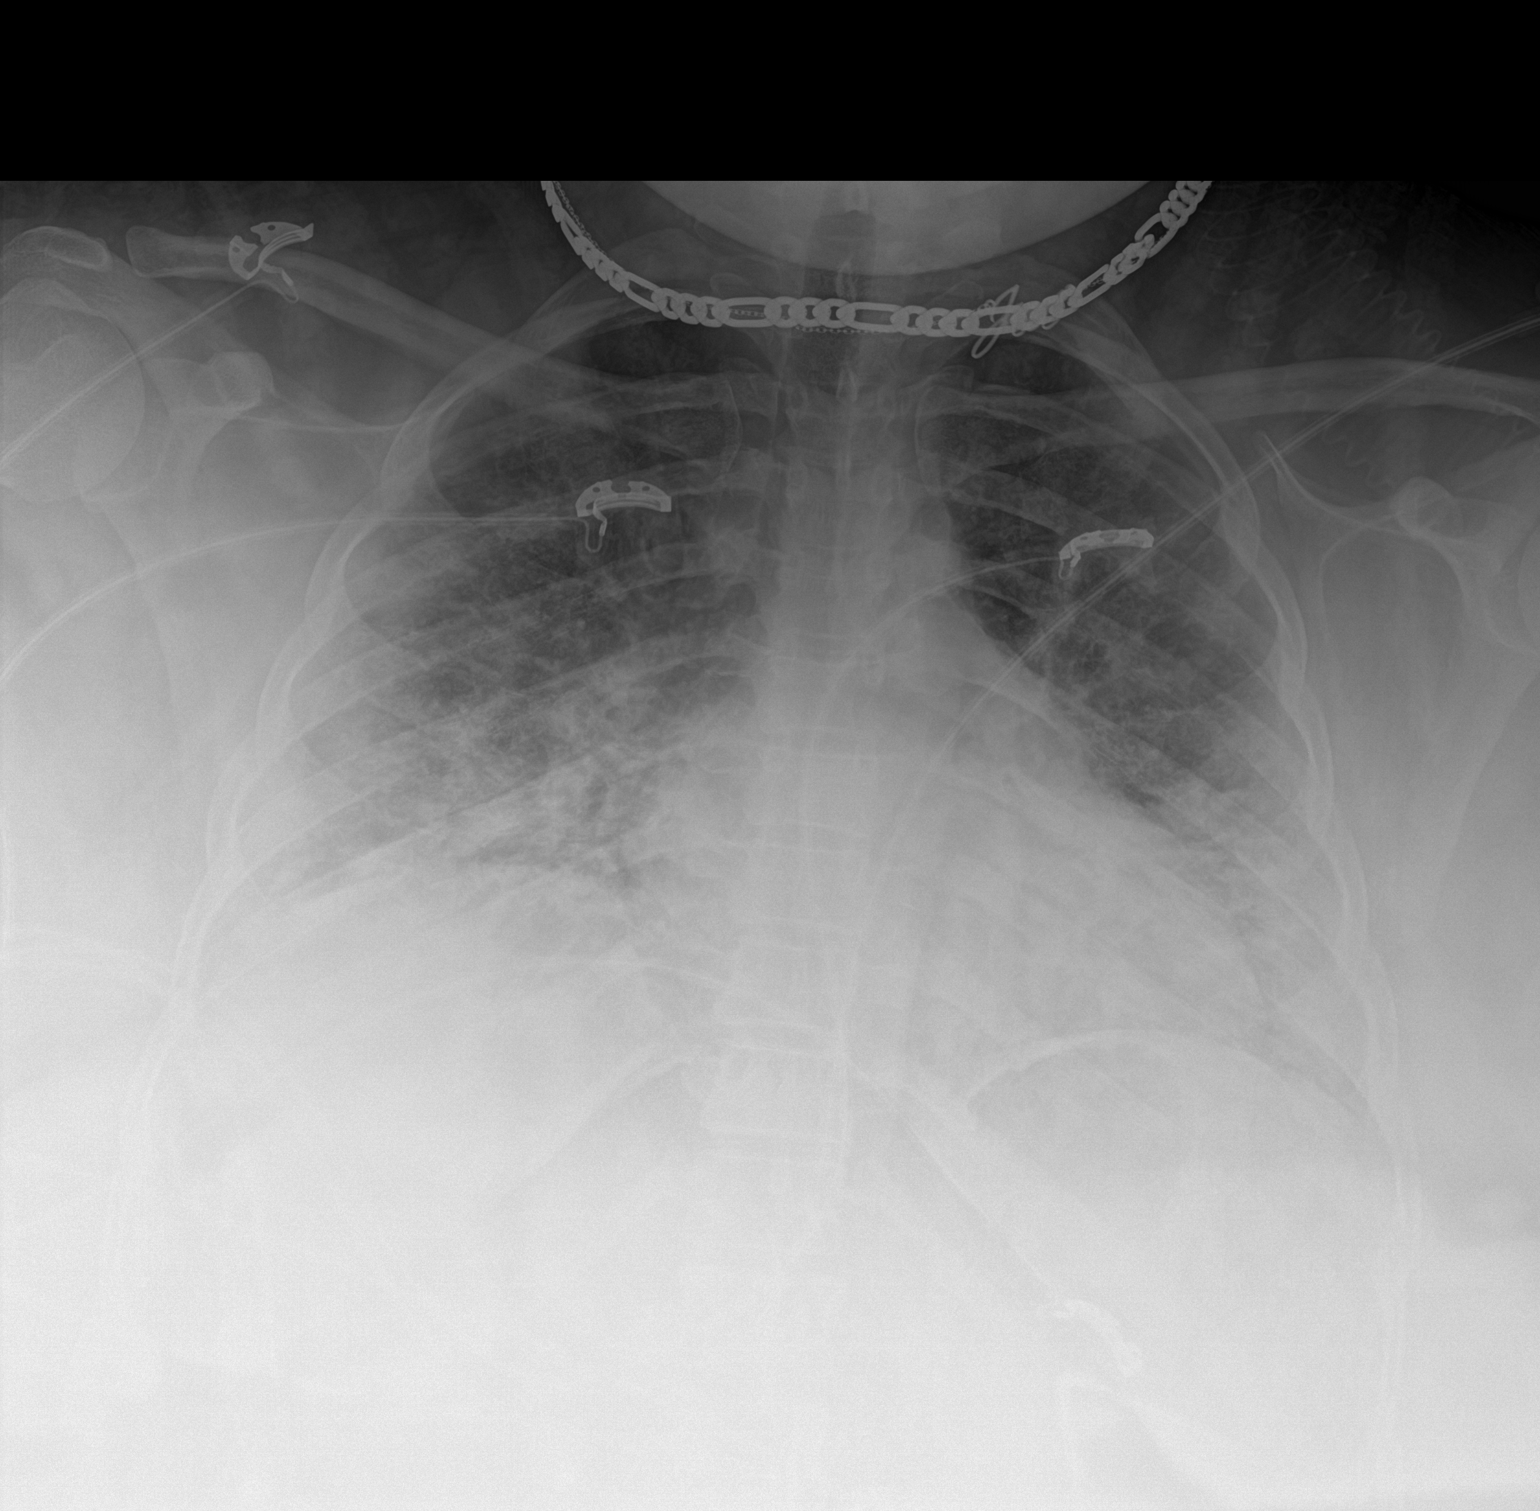

[1 of 1 positions shown; findings below may reference images not displayed]

FINDINGS: Persistent bilateral pulmonary opacities. No significant pleural
effusion. No pneumothorax. Stable cardiomediastinal contours.
IMPRESSION: Persistent bilateral pulmonary opacities consistent with [id number]
pneumonia.
